# Patient Record
Sex: Female | Born: 1965
Health system: Southern US, Community
[De-identification: ages and names within clinical notes are randomized; demographics above are authoritative.]

## PROBLEM LIST (undated history)

## (undated) DIAGNOSIS — F17200 Nicotine dependence, unspecified, uncomplicated: Secondary | ICD-10-CM

## (undated) DIAGNOSIS — E063 Autoimmune thyroiditis: Secondary | ICD-10-CM

## (undated) DIAGNOSIS — E7800 Pure hypercholesterolemia, unspecified: Secondary | ICD-10-CM

## (undated) DIAGNOSIS — N907 Vulvar cyst: Secondary | ICD-10-CM

## (undated) DIAGNOSIS — N870 Mild cervical dysplasia: Secondary | ICD-10-CM

## (undated) DIAGNOSIS — I10 Essential (primary) hypertension: Secondary | ICD-10-CM

## (undated) DIAGNOSIS — E079 Disorder of thyroid, unspecified: Secondary | ICD-10-CM

## (undated) DIAGNOSIS — K7460 Unspecified cirrhosis of liver: Secondary | ICD-10-CM

## (undated) DIAGNOSIS — F32A Depression, unspecified: Secondary | ICD-10-CM

## (undated) DIAGNOSIS — K7581 Nonalcoholic steatohepatitis (NASH): Secondary | ICD-10-CM

## (undated) DIAGNOSIS — F419 Anxiety disorder, unspecified: Secondary | ICD-10-CM

## (undated) DIAGNOSIS — E039 Hypothyroidism, unspecified: Secondary | ICD-10-CM

## (undated) DIAGNOSIS — K219 Gastro-esophageal reflux disease without esophagitis: Secondary | ICD-10-CM

## (undated) DIAGNOSIS — E119 Type 2 diabetes mellitus without complications: Secondary | ICD-10-CM

## (undated) DIAGNOSIS — R768 Other specified abnormal immunological findings in serum: Secondary | ICD-10-CM

## (undated) DIAGNOSIS — F430 Acute stress reaction: Secondary | ICD-10-CM

## (undated) DIAGNOSIS — J309 Allergic rhinitis, unspecified: Secondary | ICD-10-CM

## (undated) DIAGNOSIS — E785 Hyperlipidemia, unspecified: Secondary | ICD-10-CM

## (undated) DIAGNOSIS — K760 Fatty (change of) liver, not elsewhere classified: Secondary | ICD-10-CM

## (undated) DIAGNOSIS — R569 Unspecified convulsions: Secondary | ICD-10-CM

## (undated) DIAGNOSIS — J449 Chronic obstructive pulmonary disease, unspecified: Secondary | ICD-10-CM

## (undated) HISTORY — DX: Acute stress reaction: F43.0

## (undated) HISTORY — DX: Depression, unspecified: F32.A

## (undated) HISTORY — DX: Hereditary hemochromatosis: E83.110

## (undated) HISTORY — DX: Fatty (change of) liver, not elsewhere classified: K76.0

## (undated) HISTORY — PX: HAND SURGERY: SHX662

## (undated) HISTORY — DX: Hyperlipidemia, unspecified: E78.5

## (undated) HISTORY — DX: Disorder of thyroid, unspecified: E07.9

## (undated) HISTORY — DX: Autoimmune thyroiditis: E06.3

## (undated) HISTORY — DX: Pure hypercholesterolemia, unspecified: E78.00

## (undated) HISTORY — DX: Vulvar cyst: N90.7

## (undated) HISTORY — PX: COLPOSCOPY: SHX161

## (undated) HISTORY — DX: Other specified abnormal immunological findings in serum: R76.8

## (undated) HISTORY — DX: Nicotine dependence, unspecified, uncomplicated: F17.200

## (undated) HISTORY — DX: Mild cervical dysplasia: N87.0

## (undated) HISTORY — PX: TUBAL LIGATION: SHX77

## (undated) HISTORY — PX: CERVICAL BIOPSY  W/ LOOP ELECTRODE EXCISION: SUR135

## (undated) HISTORY — DX: Allergic rhinitis, unspecified: J30.9

## (undated) HISTORY — DX: Nonalcoholic steatohepatitis (NASH): K75.81

## (undated) HISTORY — DX: Hemochromatosis, unspecified: E83.119

## (undated) HISTORY — DX: Unspecified cirrhosis of liver: K74.60

## (undated) HISTORY — DX: Essential (primary) hypertension: I10

## (undated) HISTORY — DX: Anxiety disorder, unspecified: F41.9

## (undated) HISTORY — DX: Chronic obstructive pulmonary disease, unspecified: J44.9

## (undated) HISTORY — DX: Morbid (severe) obesity due to excess calories: E66.01

## (undated) HISTORY — DX: Type 2 diabetes mellitus without complications: E11.9

---

## 1999-11-06 ENCOUNTER — Ambulatory Visit (HOSPITAL_COMMUNITY): Admission: RE | Admit: 1999-11-06 | Discharge: 1999-11-06 | Payer: Self-pay | Admitting: Family Medicine

## 1999-11-06 ENCOUNTER — Encounter: Payer: Self-pay | Admitting: Family Medicine

## 2001-08-06 ENCOUNTER — Encounter: Payer: Self-pay | Admitting: Emergency Medicine

## 2001-08-06 ENCOUNTER — Emergency Department (HOSPITAL_COMMUNITY): Admission: EM | Admit: 2001-08-06 | Discharge: 2001-08-06 | Payer: Self-pay | Admitting: Emergency Medicine

## 2002-05-12 ENCOUNTER — Other Ambulatory Visit: Admission: RE | Admit: 2002-05-12 | Discharge: 2002-05-12 | Payer: Self-pay | Admitting: Obstetrics and Gynecology

## 2002-07-05 ENCOUNTER — Ambulatory Visit (HOSPITAL_COMMUNITY): Admission: RE | Admit: 2002-07-05 | Discharge: 2002-07-05 | Payer: Self-pay | Admitting: Family Medicine

## 2002-07-05 ENCOUNTER — Encounter: Payer: Self-pay | Admitting: Family Medicine

## 2002-07-20 ENCOUNTER — Encounter: Payer: Self-pay | Admitting: Family Medicine

## 2002-07-20 ENCOUNTER — Ambulatory Visit (HOSPITAL_COMMUNITY): Admission: RE | Admit: 2002-07-20 | Discharge: 2002-07-20 | Payer: Self-pay | Admitting: Family Medicine

## 2003-06-17 ENCOUNTER — Other Ambulatory Visit: Admission: RE | Admit: 2003-06-17 | Discharge: 2003-06-17 | Payer: Self-pay | Admitting: Obstetrics and Gynecology

## 2005-04-23 ENCOUNTER — Other Ambulatory Visit: Admission: RE | Admit: 2005-04-23 | Discharge: 2005-04-23 | Payer: Self-pay | Admitting: Obstetrics and Gynecology

## 2005-06-17 ENCOUNTER — Other Ambulatory Visit: Admission: RE | Admit: 2005-06-17 | Discharge: 2005-06-17 | Payer: Self-pay | Admitting: Obstetrics and Gynecology

## 2006-02-06 ENCOUNTER — Other Ambulatory Visit: Admission: RE | Admit: 2006-02-06 | Discharge: 2006-02-06 | Payer: Self-pay | Admitting: Obstetrics and Gynecology

## 2006-03-21 ENCOUNTER — Ambulatory Visit (HOSPITAL_BASED_OUTPATIENT_CLINIC_OR_DEPARTMENT_OTHER): Admission: RE | Admit: 2006-03-21 | Discharge: 2006-03-21 | Payer: Self-pay | Admitting: Obstetrics and Gynecology

## 2010-11-26 ENCOUNTER — Other Ambulatory Visit: Payer: Self-pay | Admitting: Family Medicine

## 2010-11-26 DIAGNOSIS — Z1231 Encounter for screening mammogram for malignant neoplasm of breast: Secondary | ICD-10-CM

## 2010-12-03 ENCOUNTER — Ambulatory Visit
Admission: RE | Admit: 2010-12-03 | Discharge: 2010-12-03 | Disposition: A | Discharge status: Home / Self Care | Payer: BC Managed Care – PPO | Source: Ambulatory Visit | Attending: Family Medicine | Admitting: Family Medicine

## 2010-12-03 DIAGNOSIS — Z1231 Encounter for screening mammogram for malignant neoplasm of breast: Secondary | ICD-10-CM

## 2010-12-05 ENCOUNTER — Other Ambulatory Visit: Payer: Self-pay | Admitting: Family Medicine

## 2010-12-05 DIAGNOSIS — R928 Other abnormal and inconclusive findings on diagnostic imaging of breast: Secondary | ICD-10-CM

## 2010-12-26 ENCOUNTER — Ambulatory Visit
Admission: RE | Admit: 2010-12-26 | Discharge: 2010-12-26 | Disposition: A | Discharge status: Home / Self Care | Payer: BC Managed Care – PPO | Source: Ambulatory Visit | Attending: Family Medicine | Admitting: Family Medicine

## 2010-12-26 DIAGNOSIS — R928 Other abnormal and inconclusive findings on diagnostic imaging of breast: Secondary | ICD-10-CM

## 2011-02-19 ENCOUNTER — Encounter: Payer: Self-pay | Admitting: Gynecology

## 2011-02-19 DIAGNOSIS — N870 Mild cervical dysplasia: Secondary | ICD-10-CM | POA: Insufficient documentation

## 2011-02-19 DIAGNOSIS — E039 Hypothyroidism, unspecified: Secondary | ICD-10-CM | POA: Insufficient documentation

## 2011-02-19 DIAGNOSIS — N907 Vulvar cyst: Secondary | ICD-10-CM | POA: Insufficient documentation

## 2011-02-26 ENCOUNTER — Ambulatory Visit (INDEPENDENT_AMBULATORY_CARE_PROVIDER_SITE_OTHER): Payer: BC Managed Care – PPO | Admitting: Obstetrics and Gynecology

## 2011-02-26 ENCOUNTER — Other Ambulatory Visit (HOSPITAL_COMMUNITY)
Admission: RE | Admit: 2011-02-26 | Discharge: 2011-02-26 | Disposition: A | Discharge status: Home / Self Care | Payer: BC Managed Care – PPO | Source: Ambulatory Visit | Attending: Obstetrics and Gynecology | Admitting: Obstetrics and Gynecology

## 2011-02-26 ENCOUNTER — Encounter: Payer: Self-pay | Admitting: Obstetrics and Gynecology

## 2011-02-26 VITALS — BP 120/80 | Ht 61.0 in | Wt 135.0 lb

## 2011-02-26 DIAGNOSIS — I10 Essential (primary) hypertension: Secondary | ICD-10-CM | POA: Insufficient documentation

## 2011-02-26 DIAGNOSIS — Z01419 Encounter for gynecological examination (general) (routine) without abnormal findings: Secondary | ICD-10-CM | POA: Insufficient documentation

## 2011-02-26 DIAGNOSIS — N390 Urinary tract infection, site not specified: Secondary | ICD-10-CM

## 2011-02-26 LAB — URINALYSIS W MICROSCOPIC + REFLEX CULTURE
Casts: NONE SEEN
Crystals: NONE SEEN
Glucose, UA: NEGATIVE mg/dL
Ketones, ur: NEGATIVE mg/dL
Leukocytes, UA: NEGATIVE
Nitrite: NEGATIVE
Specific Gravity, Urine: 1.015 (ref 1.005–1.030)
WBC, UA: NONE SEEN WBC/hpf (ref <3)
pH: 5.5 (ref 5.0–8.0)

## 2011-02-26 NOTE — Progress Notes (Signed)
Patient is a 46 year old gravida 1 para 1 AB 0 who came to see me today for a new patient visit for annual gynecological exam. She used to be a patient of our office. We have not seen her since 2008. We have treated her at that point for HPV with a LEEP. She has not had any followup Paps. Her periods are fine. She just had  a mammogram. She required followup because of a possible mass. The final diagnosis was just fibrocystic disease. She is due for short term followup. She does her lab work from her PCP. She had hyperthyroidism and had radioactive iodine. She is now on thyroid replacement. She contraceptives by tubal ligation.  Physical examination:  Caryn Bee present. HEENT within normal limits. Neck: Thyroid not large. No masses. Supraclavicular nodes: not enlarged. Breasts: Examined in both sitting midline position. No skin changes and no masses. Abdomen: Soft no guarding rebound or masses or hernia. Pelvic: External: Within normal limits. BUS: Within normal limits. Vaginal:within normal limits. Good estrogen effect. No evidence of cystocele rectocele or enterocele. Cervix: clean. Uterus: Normal size and shape. Adnexa: No masses. Rectovaginal exam: Confirmatory and negative. Extremities: Within normal limits.  Urinalysis today showed microscopic hematuria. She is one week from the end of her last period. She's never been told this previously.  Assessment: #1. CIN status post leap #2. Fibrocystic breast disease #3. Microscopic hematuria.  Plan: Told her she should return in 6 months for Pap. Short-term mammogram followup. Urine culture done. She will call for the results. If negative I told her we would recheck it one more time and is still present get urological consult.

## 2011-02-28 ENCOUNTER — Other Ambulatory Visit: Payer: Self-pay | Admitting: *Deleted

## 2011-02-28 DIAGNOSIS — N390 Urinary tract infection, site not specified: Secondary | ICD-10-CM

## 2011-04-04 ENCOUNTER — Other Ambulatory Visit: Payer: BC Managed Care – PPO

## 2011-04-04 DIAGNOSIS — N390 Urinary tract infection, site not specified: Secondary | ICD-10-CM

## 2011-04-05 LAB — URINALYSIS W MICROSCOPIC + REFLEX CULTURE
Bilirubin Urine: NEGATIVE
Crystals: NONE SEEN
Glucose, UA: NEGATIVE mg/dL
Specific Gravity, Urine: 1.011 (ref 1.005–1.030)
Squamous Epithelial / LPF: NONE SEEN

## 2011-04-08 ENCOUNTER — Other Ambulatory Visit: Payer: Self-pay | Admitting: *Deleted

## 2011-04-08 DIAGNOSIS — R3129 Other microscopic hematuria: Secondary | ICD-10-CM

## 2011-07-24 ENCOUNTER — Other Ambulatory Visit: Payer: Self-pay | Admitting: Family Medicine

## 2011-07-24 DIAGNOSIS — N6489 Other specified disorders of breast: Secondary | ICD-10-CM

## 2011-12-09 ENCOUNTER — Ambulatory Visit
Admission: RE | Admit: 2011-12-09 | Discharge: 2011-12-09 | Disposition: A | Discharge status: Home / Self Care | Payer: BC Managed Care – PPO | Source: Ambulatory Visit | Attending: Family Medicine | Admitting: Family Medicine

## 2011-12-09 ENCOUNTER — Other Ambulatory Visit: Payer: Self-pay | Admitting: Family Medicine

## 2011-12-09 DIAGNOSIS — N6489 Other specified disorders of breast: Secondary | ICD-10-CM

## 2012-06-01 ENCOUNTER — Other Ambulatory Visit: Payer: Self-pay | Admitting: Family Medicine

## 2012-06-01 DIAGNOSIS — R109 Unspecified abdominal pain: Secondary | ICD-10-CM

## 2012-06-02 ENCOUNTER — Ambulatory Visit
Admission: RE | Admit: 2012-06-02 | Discharge: 2012-06-02 | Disposition: A | Discharge status: Home / Self Care | Payer: BC Managed Care – PPO | Source: Ambulatory Visit | Attending: Family Medicine | Admitting: Family Medicine

## 2012-06-02 DIAGNOSIS — R109 Unspecified abdominal pain: Secondary | ICD-10-CM

## 2012-09-30 ENCOUNTER — Ambulatory Visit
Admission: RE | Admit: 2012-09-30 | Discharge: 2012-09-30 | Disposition: A | Discharge status: Home / Self Care | Payer: BC Managed Care – PPO | Source: Ambulatory Visit | Attending: Family Medicine | Admitting: Family Medicine

## 2012-09-30 ENCOUNTER — Other Ambulatory Visit: Payer: Self-pay | Admitting: Family Medicine

## 2012-09-30 DIAGNOSIS — R05 Cough: Secondary | ICD-10-CM

## 2012-09-30 DIAGNOSIS — R059 Cough, unspecified: Secondary | ICD-10-CM

## 2013-12-13 ENCOUNTER — Encounter: Payer: Self-pay | Admitting: Obstetrics and Gynecology

## 2014-12-26 ENCOUNTER — Other Ambulatory Visit: Payer: Self-pay | Admitting: Family Medicine

## 2014-12-26 ENCOUNTER — Other Ambulatory Visit (HOSPITAL_COMMUNITY)
Admission: RE | Admit: 2014-12-26 | Discharge: 2014-12-26 | Disposition: A | Discharge status: Home / Self Care | Payer: BLUE CROSS/BLUE SHIELD | Source: Ambulatory Visit | Attending: Family Medicine | Admitting: Family Medicine

## 2014-12-26 DIAGNOSIS — Z1151 Encounter for screening for human papillomavirus (HPV): Secondary | ICD-10-CM | POA: Insufficient documentation

## 2014-12-26 DIAGNOSIS — Z01419 Encounter for gynecological examination (general) (routine) without abnormal findings: Secondary | ICD-10-CM | POA: Insufficient documentation

## 2014-12-29 LAB — CYTOLOGY - PAP

## 2015-04-28 ENCOUNTER — Emergency Department (HOSPITAL_COMMUNITY): Payer: BLUE CROSS/BLUE SHIELD

## 2015-04-28 ENCOUNTER — Inpatient Hospital Stay (HOSPITAL_COMMUNITY)
Admission: EM | Admit: 2015-04-28 | Discharge: 2015-04-29 | DRG: 101 | Disposition: A | Discharge status: Home / Self Care | Payer: BLUE CROSS/BLUE SHIELD | Attending: Internal Medicine | Admitting: Internal Medicine

## 2015-04-28 ENCOUNTER — Encounter (HOSPITAL_COMMUNITY): Payer: Self-pay | Admitting: Emergency Medicine

## 2015-04-28 ENCOUNTER — Inpatient Hospital Stay (HOSPITAL_COMMUNITY): Payer: BLUE CROSS/BLUE SHIELD

## 2015-04-28 DIAGNOSIS — E038 Other specified hypothyroidism: Secondary | ICD-10-CM

## 2015-04-28 DIAGNOSIS — G4089 Other seizures: Principal | ICD-10-CM | POA: Diagnosis present

## 2015-04-28 DIAGNOSIS — Y929 Unspecified place or not applicable: Secondary | ICD-10-CM

## 2015-04-28 DIAGNOSIS — S43015D Anterior dislocation of left humerus, subsequent encounter: Secondary | ICD-10-CM

## 2015-04-28 DIAGNOSIS — I10 Essential (primary) hypertension: Secondary | ICD-10-CM | POA: Diagnosis present

## 2015-04-28 DIAGNOSIS — F329 Major depressive disorder, single episode, unspecified: Secondary | ICD-10-CM | POA: Diagnosis present

## 2015-04-28 DIAGNOSIS — W1830XA Fall on same level, unspecified, initial encounter: Secondary | ICD-10-CM | POA: Diagnosis present

## 2015-04-28 DIAGNOSIS — E039 Hypothyroidism, unspecified: Secondary | ICD-10-CM | POA: Diagnosis present

## 2015-04-28 DIAGNOSIS — R569 Unspecified convulsions: Secondary | ICD-10-CM | POA: Diagnosis not present

## 2015-04-28 DIAGNOSIS — S43005A Unspecified dislocation of left shoulder joint, initial encounter: Secondary | ICD-10-CM

## 2015-04-28 DIAGNOSIS — S43016A Anterior dislocation of unspecified humerus, initial encounter: Secondary | ICD-10-CM | POA: Diagnosis present

## 2015-04-28 DIAGNOSIS — Z87891 Personal history of nicotine dependence: Secondary | ICD-10-CM | POA: Diagnosis not present

## 2015-04-28 LAB — CBC WITH DIFFERENTIAL/PLATELET
BASOS ABS: 0 10*3/uL (ref 0.0–0.1)
BASOS PCT: 0 %
EOS PCT: 1 %
Eosinophils Absolute: 0.1 10*3/uL (ref 0.0–0.7)
HEMATOCRIT: 40.7 % (ref 36.0–46.0)
Hemoglobin: 14.4 g/dL (ref 12.0–15.0)
Lymphocytes Relative: 12 %
Lymphs Abs: 1.4 10*3/uL (ref 0.7–4.0)
MCH: 32.4 pg (ref 26.0–34.0)
MCHC: 35.4 g/dL (ref 30.0–36.0)
MCV: 91.7 fL (ref 78.0–100.0)
MONO ABS: 0.5 10*3/uL (ref 0.1–1.0)
Monocytes Relative: 4 %
NEUTROS ABS: 10.2 10*3/uL — AB (ref 1.7–7.7)
Neutrophils Relative %: 83 %
PLATELETS: 235 10*3/uL (ref 150–400)
RBC: 4.44 MIL/uL (ref 3.87–5.11)
RDW: 14 % (ref 11.5–15.5)
WBC: 12.3 10*3/uL — AB (ref 4.0–10.5)

## 2015-04-28 LAB — RAPID URINE DRUG SCREEN, HOSP PERFORMED
Amphetamines: NOT DETECTED
BARBITURATES: NOT DETECTED
BENZODIAZEPINES: NOT DETECTED
COCAINE: NOT DETECTED
Opiates: POSITIVE — AB
TETRAHYDROCANNABINOL: NOT DETECTED

## 2015-04-28 LAB — COMPREHENSIVE METABOLIC PANEL
ALBUMIN: 3.9 g/dL (ref 3.5–5.0)
ALT: 21 U/L (ref 14–54)
AST: 23 U/L (ref 15–41)
Alkaline Phosphatase: 68 U/L (ref 38–126)
Anion gap: 12 (ref 5–15)
BUN: 15 mg/dL (ref 6–20)
CHLORIDE: 107 mmol/L (ref 101–111)
CO2: 23 mmol/L (ref 22–32)
Calcium: 9.3 mg/dL (ref 8.9–10.3)
Creatinine, Ser: 1.23 mg/dL — ABNORMAL HIGH (ref 0.44–1.00)
GFR calc Af Amer: 58 mL/min — ABNORMAL LOW (ref >60)
GFR calc non Af Amer: 50 mL/min — ABNORMAL LOW (ref >60)
GLUCOSE: 86 mg/dL (ref 65–99)
POTASSIUM: 4.6 mmol/L (ref 3.5–5.1)
Sodium: 142 mmol/L (ref 135–145)
Total Bilirubin: 0.3 mg/dL (ref 0.3–1.2)
Total Protein: 6.9 g/dL (ref 6.5–8.1)

## 2015-04-28 LAB — URINALYSIS, ROUTINE W REFLEX MICROSCOPIC
Bilirubin Urine: NEGATIVE
GLUCOSE, UA: NEGATIVE mg/dL
HGB URINE DIPSTICK: NEGATIVE
Ketones, ur: NEGATIVE mg/dL
Leukocytes, UA: NEGATIVE
Nitrite: NEGATIVE
Protein, ur: NEGATIVE mg/dL
SPECIFIC GRAVITY, URINE: 1.019 (ref 1.005–1.030)
pH: 6.5 (ref 5.0–8.0)

## 2015-04-28 LAB — CK: Total CK: 197 U/L (ref 38–234)

## 2015-04-28 LAB — ETHANOL

## 2015-04-28 LAB — TSH: TSH: 2.338 u[IU]/mL (ref 0.350–4.500)

## 2015-04-28 LAB — PROTIME-INR
INR: 1 (ref 0.00–1.49)
PROTHROMBIN TIME: 13.4 s (ref 11.6–15.2)

## 2015-04-28 MED ORDER — ONDANSETRON HCL 4 MG/2ML IJ SOLN: 4.0000 mg | Freq: Four times a day (QID) | INTRAMUSCULAR | Status: DC | PRN

## 2015-04-28 MED ORDER — DIVALPROEX SODIUM 500 MG PO DR TAB
500.0000 mg | DELAYED_RELEASE_TABLET | Freq: Two times a day (BID) | ORAL | Status: DC
  Administered 2015-04-29: 500 mg via ORAL
  Filled 2015-04-28: qty 1

## 2015-04-28 MED ORDER — LORAZEPAM 2 MG/ML IJ SOLN
0.5000 mg | Freq: Once | INTRAMUSCULAR | Status: AC
  Administered 2015-04-28: 0.5 mg via INTRAVENOUS
  Filled 2015-04-28: qty 1

## 2015-04-28 MED ORDER — LEVOTHYROXINE SODIUM 175 MCG PO TABS
175.0000 ug | ORAL_TABLET | Freq: Every day | ORAL | Status: DC
  Administered 2015-04-29: 175 ug via ORAL
  Filled 2015-04-28: qty 1

## 2015-04-28 MED ORDER — PROPOFOL 10 MG/ML IV BOLUS
1.0000 mg/kg | Freq: Once | INTRAVENOUS | Status: DC
  Filled 2015-04-28: qty 20

## 2015-04-28 MED ORDER — MORPHINE SULFATE (PF) 4 MG/ML IV SOLN
4.0000 mg | Freq: Once | INTRAVENOUS | Status: AC
  Administered 2015-04-28: 4 mg via INTRAVENOUS
  Filled 2015-04-28: qty 1

## 2015-04-28 MED ORDER — BUPROPION HCL ER (XL) 300 MG PO TB24: 900.0000 mg | ORAL_TABLET | Freq: Every day | ORAL | Status: DC

## 2015-04-28 MED ORDER — DEXTROSE 5 % IV SOLN
1000.0000 mg | Freq: Once | INTRAVENOUS | Status: AC
Start: 2015-04-28 — End: 2015-04-28
  Administered 2015-04-28: 1000 mg via INTRAVENOUS
  Filled 2015-04-28: qty 10

## 2015-04-28 MED ORDER — HEPARIN SODIUM (PORCINE) 5000 UNIT/ML IJ SOLN
5000.0000 [IU] | Freq: Three times a day (TID) | INTRAMUSCULAR | Status: DC
  Administered 2015-04-28 – 2015-04-29 (×2): 5000 [IU] via SUBCUTANEOUS
  Filled 2015-04-28 (×2): qty 1

## 2015-04-28 MED ORDER — PROPOFOL 10 MG/ML IV BOLUS
INTRAVENOUS | Status: AC
Start: 2015-04-28 — End: 2015-04-29
  Filled 2015-04-28: qty 20

## 2015-04-28 MED ORDER — ONDANSETRON HCL 4 MG PO TABS: 4.0000 mg | ORAL_TABLET | Freq: Four times a day (QID) | ORAL | Status: DC | PRN

## 2015-04-28 MED ORDER — PANTOPRAZOLE SODIUM 40 MG PO TBEC
40.0000 mg | DELAYED_RELEASE_TABLET | Freq: Every day | ORAL | Status: DC
  Administered 2015-04-28 – 2015-04-29 (×2): 40 mg via ORAL
  Filled 2015-04-28 (×2): qty 1

## 2015-04-28 MED ORDER — HYDROCODONE-ACETAMINOPHEN 5-325 MG PO TABS
1.0000 | ORAL_TABLET | ORAL | Status: DC | PRN
  Administered 2015-04-29 (×3): 2 via ORAL
  Filled 2015-04-28 (×3): qty 2

## 2015-04-28 MED ORDER — GADOBENATE DIMEGLUMINE 529 MG/ML IV SOLN
13.0000 mL | Freq: Once | INTRAVENOUS | Status: AC | PRN
  Administered 2015-04-28: 13 mL via INTRAVENOUS

## 2015-04-28 MED ORDER — MORPHINE SULFATE (PF) 2 MG/ML IV SOLN
1.0000 mg | INTRAVENOUS | Status: DC | PRN
  Administered 2015-04-29: 1 mg via INTRAVENOUS
  Filled 2015-04-28: qty 1

## 2015-04-28 MED ORDER — LOSARTAN POTASSIUM 50 MG PO TABS
50.0000 mg | ORAL_TABLET | Freq: Every day | ORAL | Status: DC
  Administered 2015-04-29: 50 mg via ORAL
  Filled 2015-04-28: qty 1

## 2015-04-28 MED ORDER — PROPOFOL 10 MG/ML IV BOLUS
INTRAVENOUS | Status: DC | PRN
  Administered 2015-04-28: 60 mg via INTRAVENOUS

## 2015-04-28 MED ORDER — LORAZEPAM 2 MG/ML IJ SOLN: 2.0000 mg | INTRAMUSCULAR | Status: DC | PRN

## 2015-04-28 MED ORDER — SODIUM CHLORIDE 0.9 % IV SOLN
INTRAVENOUS | Status: DC
  Administered 2015-04-28: 23:00:00 via INTRAVENOUS

## 2015-04-28 MED ORDER — ACETAMINOPHEN 325 MG PO TABS: 650.0000 mg | ORAL_TABLET | Freq: Four times a day (QID) | ORAL | Status: DC | PRN

## 2015-04-28 MED ORDER — SODIUM CHLORIDE 0.9% FLUSH
3.0000 mL | Freq: Two times a day (BID) | INTRAVENOUS | Status: DC
Start: 2015-04-28 — End: 2015-04-29
  Administered 2015-04-28: 3 mL via INTRAVENOUS

## 2015-04-28 MED ORDER — PROPOFOL 10 MG/ML IV BOLUS
INTRAVENOUS | Status: AC | PRN
  Administered 2015-04-28: 61 mg via INTRAVENOUS

## 2015-04-28 MED ORDER — ACETAMINOPHEN 650 MG RE SUPP: 650.0000 mg | Freq: Four times a day (QID) | RECTAL | Status: DC | PRN

## 2015-04-28 NOTE — ED Notes (Signed)
Pt here after having a first time seizure while working , pt did fall on left shoulder and is c/o pain in that shoulder

## 2015-04-28 NOTE — Progress Notes (Signed)
Orthopedic Tech Progress Note Patient Details:  Emma Glenn 1965/12/11 [id number] Assisted with dislocation reduction then applied shoulder immobilizer sling to LUE. Ortho Devices Type of Ortho Device: Shoulder immobilizer Ortho Device/Splint Location: LUE Ortho Device/Splint Interventions: Application   Darrol Poke 04/28/2015, 2:47 PM

## 2015-04-28 NOTE — ED Notes (Signed)
Attempted report 

## 2015-04-28 NOTE — Consult Note (Signed)
Requesting Physician: Josephina Gip, PA     Reason for consultation: seizure   HPI:                                                                                                                                         Emma Glenn is an 50 y.o. female patient who presented with a seizure. She had a left shoulder dislocation secondary to seizure. There is a first ever seizure lifetime. Denies any febrile seizures no prior head trauma or any intracranial pathology. She is at her baseline on no focal symptoms other than severe pain from left shoulder dislocation.   Past Medical History: Past Medical History  Diagnosis Date  . CIN I (cervical intraepithelial neoplasia I)   . Labial cyst     Inclusion cyst  . Hypertension   . Thyroid disease     Graves dis-Radioactive Iodine    Past Surgical History  Procedure Laterality Date  . Tubal ligation    . Cervical biopsy  w/ loop electrode excision      Excision labial inclusion cyst  . Hand surgery    . Colposcopy      Family History: Family History  Problem Relation Age of Onset  . Hypertension Mother   . Heart disease Mother   . Diabetes Father   . Heart disease Father   . Heart disease Brother   . Breast cancer Maternal Grandmother     Social History:   reports that she has quit smoking. Her smoking use included Cigarettes. She smoked 1.00 pack per day. She does not have any smokeless tobacco history on file. She reports that she drinks alcohol. Her drug history is not on file.  Allergies:  Allergies  Allergen Reactions  . Nitrofurantoin Monohyd Macro      Medications:                                                                                                                         Current facility-administered medications:  .  propofol (DIPRIVAN) 10 mg/mL bolus/IV push 61.2 mg, 1 mg/kg, Intravenous, Once, Courteney Lyn Mackuen, MD .  propofol (DIPRIVAN) 10 mg/mL bolus/IV push, , , ,  .  propofol (DIPRIVAN)  10 mg/mL bolus/IV push, , Intravenous, Continuous PRN, Courteney Lyn Mackuen, MD, 61 mg at 04/28/15 1440  Current outpatient prescriptions:  .  buPROPion (WELLBUTRIN XL)  300 MG 24 hr tablet, Take 900 mg by mouth daily., Disp: , Rfl:  .  levothyroxine (SYNTHROID, LEVOTHROID) 175 MCG tablet, Take 175 mcg by mouth daily before breakfast., Disp: , Rfl:  .  losartan (COZAAR) 50 MG tablet, Take 50 mg by mouth daily., Disp: , Rfl:  .  omeprazole (PRILOSEC) 40 MG capsule, Take 40 mg by mouth daily., Disp: , Rfl:  .  phentermine 37.5 MG capsule, Take 37.5 mg by mouth every morning., Disp: , Rfl:  .  buPROPion (WELLBUTRIN XL) 150 MG 24 hr tablet, Take 150 mg by mouth daily.  , Disp: , Rfl:  .  LEVOTHYROXINE SODIUM PO, Take 0.15 mcg by mouth., Disp: , Rfl:    ROS:                                                                                                                                       History obtained from the patient  General ROS: negative for - chills, fatigue, fever, night sweats, weight gain or weight loss Psychological ROS: negative for - behavioral disorder, hallucinations, memory difficulties, mood swings or suicidal ideation Ophthalmic ROS: negative for - blurry vision, double vision, eye pain or loss of vision ENT ROS: negative for - epistaxis, nasal discharge, oral lesions, sore throat, tinnitus or vertigo Allergy and Immunology ROS: negative for - hives or itchy/watery eyes Hematological and Lymphatic ROS: negative for - bleeding problems, bruising or swollen lymph nodes Endocrine ROS: negative for - galactorrhea, hair pattern changes, polydipsia/polyuria or temperature intolerance Respiratory ROS: negative for - cough, hemoptysis, shortness of breath or wheezing Cardiovascular ROS: negative for - chest pain, dyspnea on exertion, edema or irregular heartbeat Gastrointestinal ROS: negative for - abdominal pain, diarrhea, hematemesis, nausea/vomiting or stool  incontinence Genito-Urinary ROS: negative for - dysuria, hematuria, incontinence or urinary frequency/urgency Musculoskeletal ROS: negative for - joint swelling or muscular weakness Neurological ROS: as noted in HPI Dermatological ROS: negative for rash and skin lesion changes  Neurologic Examination:                                                                                                    Today's Vitals   04/28/15 1223 04/28/15 1227 04/28/15 1444 04/28/15 1447  BP:  155/109 164/120 148/118  Pulse:  110 100 100  Temp: 98.5 F (36.9 C) 98.5 F (36.9 C)    TempSrc: Oral Oral    Resp: 18 16 21 22   Height: 5' 1"  (1.549 m)     Weight:  61.236 kg (135 lb)     SpO2:  96% 99% 98%  PainSc:        Evaluation of higher integrative functions including: Level of alertness: Alert,  Oriented to time, place and person Recent and remote memory - intact   Attention span and concentration  - intact   Speech: fluent, no evidence of dysarthria or aphasia noted.  Test the following cranial nerves: 2-12 grossly intact Motor examination: Normal tone, bulk, full 5/5 motor strength in all extremities, except the left upper extremity, as she would not cooperate with exam due to severe pain from dislocation.  Examination of sensation : Normal and symmetric sensation to pinprick in all 4 extremities and on face Examination of deep tendon reflexes: 2+, normal and symmetric in all extremities, normal plantars bilaterally Test coordination: Normal finger nose testing, with no evidence of limb appendicular ataxia or abnormal involuntary movements or tremors noted.  Gait: Deferred       Lab Results: Basic Metabolic Panel:  Recent Labs Lab 04/28/15 1315  NA 142  K 4.6  CL 107  CO2 23  GLUCOSE 86  BUN 15  CREATININE 1.23*  CALCIUM 9.3    Liver Function Tests:  Recent Labs Lab 04/28/15 1315  AST 23  ALT 21  ALKPHOS 68  BILITOT 0.3  PROT 6.9  ALBUMIN 3.9   No results for  input(s): LIPASE, AMYLASE in the last 168 hours. No results for input(s): AMMONIA in the last 168 hours.  CBC:  Recent Labs Lab 04/28/15 1315  WBC 12.3*  NEUTROABS 10.2*  HGB 14.4  HCT 40.7  MCV 91.7  PLT 235    Cardiac Enzymes: No results for input(s): CKTOTAL, CKMB, CKMBINDEX, TROPONINI in the last 168 hours.  Lipid Panel: No results for input(s): CHOL, TRIG, HDL, CHOLHDL, VLDL, LDLCALC in the last 168 hours.  CBG: No results for input(s): GLUCAP in the last 168 hours.  Microbiology: Results for orders placed or performed in visit on 04/04/11  Urine culture     Status: None   Collection Time: 04/04/11  3:53 PM  Result Value Ref Range Status   Colony Count NO GROWTH  Final   Organism ID, Bacteria NO GROWTH  Final     Imaging: Ct Head Wo Contrast  04/28/2015  CLINICAL DATA:  50 year old female status post fall out of a truck following seizure-like activity EXAM: CT HEAD WITHOUT CONTRAST TECHNIQUE: Contiguous axial images were obtained from the base of the skull through the vertex without intravenous contrast. COMPARISON:  None. FINDINGS: Negative for acute intracranial hemorrhage, acute infarction, mass, mass effect, hydrocephalus or midline shift. Gray-white differentiation is preserved throughout. No acute soft tissue or calvarial abnormality. The globes and orbits are symmetric and unremarkable. Normal aeration of the mastoid air cells and visualized paranasal sinuses. IMPRESSION: Negative head CT. Electronically Signed   By: Jacqulynn Cadet M.D.   On: 04/28/2015 13:55   Dg Shoulder Left  04/28/2015  CLINICAL DATA:  Acute left shoulder pain after falling out of truck. Initial encounter. EXAM: LEFT SHOULDER - 2+ VIEW COMPARISON:  None. FINDINGS: Anterior dislocation of the proximal left humeral head is noted. No fracture is noted. Visualized ribs appear normal. IMPRESSION: Anterior dislocation of the proximal left humeral head. Electronically Signed   By: Marijo Conception,  M.D.   On: 04/28/2015 14:10      Assessment and plan:   Emma Glenn is an 50 y.o. female patient who presented Following a seizure. There is a  first ever seizure in a lifetime. She had a left shoulder dislocation secondary to this. The etiology for the seizure is unknown. Patient believes that it could be related to and her Wellbutrin. She picked up a new prescription with the different recommend fracture. She is on high dose of 900 mg of Wellbutrin XL daily. Recommend further neurodiagnostic evaluation with a EEG and brain MRI study she'll be admitted for overnight observation placed on seizure precautions with when necessary Ativan. She did receive propofol small doses of left eye to help with the shoulder relocation. Given her significant seizure observers shoulder discomfort location, I recommend starting on seizure medication.-She does have chronic depression issues and hence a most of resistance Depakote would be an ideal medication for her. Recommend a loading dose of Depacon IV 1 g now in the ER followed by maintenance dose of 500 mg of Depakote DR twice a day started tomorrow morning neurology service will continue to follow up please call for any further questions

## 2015-04-28 NOTE — H&P (Signed)
[facility] History and Physical  Emma Glenn RCV:[id number] DOB: Mar 05, 1965 DOA: 04/28/2015  Referring physician: EDP PCP: Shirline Frees, MD   Chief Complaint: Seizure and left anterior shoulder dislocation  HPI: Emma Glenn is a 50 y.o. female with past medical history of hypertension and Graves' disease status post radioactive iodine, she came into the hospital because of seizures. Patient was helping her husband with loading something to his pickup truck and she developed some confusion and "uncontrolled thoughts" after that she developed tonic-clonic seizures and fell and her head struck the back of the truck, she stayed confused for about 30 minutes after the episode. Question brought to the hospital for further evaluation. In the ED x-rays showed left anterior shoulder dislocation which is reduced after patient given propofol, CT scan of the head showed no acute abnormalities, patient seen by neurology and recommended admission to the hospital for further neurological evaluation.  Review of Systems:  Constitutional: negative for anorexia, fevers and sweats Eyes: negative for irritation, redness and visual disturbance Ears, nose, mouth, throat, and face: negative for earaches, epistaxis, nasal congestion and sore throat Respiratory: negative for cough, dyspnea on exertion, sputum and wheezing Cardiovascular: negative for chest pain, dyspnea, lower extremity edema, orthopnea, palpitations and syncope Gastrointestinal: negative for abdominal pain, constipation, diarrhea, melena, nausea and vomiting Genitourinary:negative for dysuria, frequency and hematuria Hematologic/lymphatic: negative for bleeding, easy bruising and lymphadenopathy Musculoskeletal:negative for arthralgias, muscle weakness and stiff joints Neurological: Tonic-clonic seizures Endocrine: negative for diabetic symptoms including polydipsia, polyuria and weight loss Allergic/Immunologic: negative  for anaphylaxis, hay fever and urticaria  Past Medical History  Diagnosis Date  . CIN I (cervical intraepithelial neoplasia I)   . Labial cyst     Inclusion cyst  . Hypertension   . Thyroid disease     Graves dis-Radioactive Iodine   Past Surgical History  Procedure Laterality Date  . Tubal ligation    . Cervical biopsy  w/ loop electrode excision      Excision labial inclusion cyst  . Hand surgery    . Colposcopy     Social History:   reports that she has quit smoking. Her smoking use included Cigarettes. She smoked 1.00 pack per day. She does not have any smokeless tobacco history on file. She reports that she drinks alcohol. Her drug history is not on file.  Allergies  Allergen Reactions  . Nitrofurantoin Monohyd Macro     Family History  Problem Relation Age of Onset  . Hypertension Mother   . Heart disease Mother   . Diabetes Father   . Heart disease Father   . Heart disease Brother   . Breast cancer Maternal Grandmother      Prior to Admission medications   Medication Sig Start Date End Date Taking? Authorizing Provider  buPROPion (WELLBUTRIN XL) 300 MG 24 hr tablet Take 900 mg by mouth daily.   Yes Historical Provider, MD  levothyroxine (SYNTHROID, LEVOTHROID) 175 MCG tablet Take 175 mcg by mouth daily before breakfast.   Yes Historical Provider, MD  losartan (COZAAR) 50 MG tablet Take 50 mg by mouth daily.   Yes Historical Provider, MD  omeprazole (PRILOSEC) 40 MG capsule Take 40 mg by mouth daily.   Yes Historical Provider, MD  phentermine 37.5 MG capsule Take 37.5 mg by mouth every morning.   Yes Historical Provider, MD  buPROPion (WELLBUTRIN XL) 150 MG 24 hr tablet Take 150 mg by mouth daily.      Historical Provider, MD  LEVOTHYROXINE SODIUM  PO Take 0.15 mcg by mouth.    Historical Provider, MD   Physical Exam: Filed Vitals:   04/28/15 1645 04/28/15 1715  BP: 156/103 157/104  Pulse: 98 99  Temp:    Resp: 10 15   Constitutional: Oriented to person,  place, and time. Well-developed and well-nourished. Cooperative.  Head: Normocephalic and atraumatic.  Nose: Nose normal.  Mouth/Throat: Uvula is midline, oropharynx is clear and moist and mucous membranes are normal.  Eyes: Conjunctivae and EOM are normal. Pupils are equal, round, and reactive to light.  Neck: Trachea normal and normal range of motion. Neck supple.  Cardiovascular: Normal rate, regular rhythm, S1 normal, S2 normal, normal heart sounds and intact distal pulses.   Pulmonary/Chest: Effort normal and breath sounds normal.  Abdominal: Soft. Bowel sounds are normal. There is no hepatosplenomegaly. There is no tenderness.  Musculoskeletal: Normal range of motion.  Neurological: Alert and oriented to person, place, and time. Has normal strength. No cranial nerve deficit or sensory deficit.  Skin: Skin is warm, dry and intact.  Psychiatric: Has a normal mood and affect. Speech is normal and behavior is normal.   Labs on Admission:  Basic Metabolic Panel:  Recent Labs Lab 04/28/15 1315  NA 142  K 4.6  CL 107  CO2 23  GLUCOSE 86  BUN 15  CREATININE 1.23*  CALCIUM 9.3   Liver Function Tests:  Recent Labs Lab 04/28/15 1315  AST 23  ALT 21  ALKPHOS 68  BILITOT 0.3  PROT 6.9  ALBUMIN 3.9   No results for input(s): LIPASE, AMYLASE in the last 168 hours. No results for input(s): AMMONIA in the last 168 hours. CBC:  Recent Labs Lab 04/28/15 1315  WBC 12.3*  NEUTROABS 10.2*  HGB 14.4  HCT 40.7  MCV 91.7  PLT 235   Cardiac Enzymes: No results for input(s): CKTOTAL, CKMB, CKMBINDEX, TROPONINI in the last 168 hours.  BNP (last 3 results) No results for input(s): BNP in the last 8760 hours.  ProBNP (last 3 results) No results for input(s): PROBNP in the last 8760 hours.  CBG: No results for input(s): GLUCAP in the last 168 hours.  Radiological Exams on Admission: Ct Head Wo Contrast  04/28/2015  CLINICAL DATA:  49 year old female status post fall out of  a truck following seizure-like activity EXAM: CT HEAD WITHOUT CONTRAST TECHNIQUE: Contiguous axial images were obtained from the base of the skull through the vertex without intravenous contrast. COMPARISON:  None. FINDINGS: Negative for acute intracranial hemorrhage, acute infarction, mass, mass effect, hydrocephalus or midline shift. Gray-white differentiation is preserved throughout. No acute soft tissue or calvarial abnormality. The globes and orbits are symmetric and unremarkable. Normal aeration of the mastoid air cells and visualized paranasal sinuses. IMPRESSION: Negative head CT. Electronically Signed   By: Jacqulynn Cadet M.D.   On: 04/28/2015 13:55   Dg Shoulder Left  04/28/2015  CLINICAL DATA:  Acute left shoulder pain after falling out of truck. Initial encounter. EXAM: LEFT SHOULDER - 2+ VIEW COMPARISON:  None. FINDINGS: Anterior dislocation of the proximal left humeral head is noted. No fracture is noted. Visualized ribs appear normal. IMPRESSION: Anterior dislocation of the proximal left humeral head. Electronically Signed   By: Marijo Conception, M.D.   On: 04/28/2015 14:10   Dg Shoulder Left Port  04/28/2015  CLINICAL DATA:  Post reduction EXAM: LEFT SHOULDER - 1 VIEW COMPARISON:  Left shoulder radiographs from earlier today FINDINGS: The left humeral head appears to articulate properly with the  left glenoid on this single frontal view. No left acromioclavicular joint separation. No fracture or suspicious focal osseous lesion on this single frontal view. Minimal osteoarthritis in the left acromioclavicular and inferior left glenohumeral joints. IMPRESSION: Apparent successful reduction of left shoulder dislocation on this single frontal view. No evident fracture on this single frontal view. Electronically Signed   By: Ilona Sorrel M.D.   On: 04/28/2015 16:49    EKG: Independently reviewed. Sinus tach at 10 9 bpm  Assessment/Plan Principal Problem:   Seizure ([place]) Active Problems:    Hypothyroidism   Hypertension, essential   Anterior shoulder dislocation    Seizures Tonic-clonic seizure with about 30 minutes postictal state according to patient second episode. First episode about 2 weeks ago. No recent changes in medications, the only changes her pharmacy changed the brand name of the Wellbutrin. Hold Wellbutrin, seizures precautions, Ativan as needed. Neurology ordered so patient in the emergency department, EEG and MRI pending.  Left-sided anterior shoulder dislocation This is reduced in the emergency department, continue sling.  Essential hypertension Continue home medications.  Hypothyroidism  On 175 g of Synthroid, continue and check TSH.  Code Status: Full code Family Communication: Plan discussed with the patient Disposition Plan: Neuro MedSurg bed.  Time spent: 70 minutes  Jamin Panther A, MD [facility] Pager [phone]

## 2015-04-28 NOTE — Progress Notes (Signed)
Orthopedic Tech Progress Note Patient Details:  Emma Glenn [birth year] [id number] Assisted with second reduction of dislocation.  Placed shoulder immobilizer back on pt. Patient ID: [name], female   DOB: 02-18-1965, 50 y.o.   MRN: [id number]   Darrol Poke 04/28/2015, 4:26 PM

## 2015-04-28 NOTE — Progress Notes (Signed)
EEG Completed; Results Pending  

## 2015-04-28 NOTE — ED Provider Notes (Signed)
CSN: [id number]     Arrival date & time 04/28/15  1216 History   First MD Initiated Contact with Patient 04/28/15 1219     Chief Complaint  Patient presents with  . Seizures  . Shoulder Injury   HPI  Emma Glenn is a 50 year old female with PMHx of HTN and Graves dz s/p radioactive iodine tx presenting with first time seizure. Information about the seizure was gathered from her husband. She was helping load stand onto a pickup truck when she began feeling confused. Her husband states that he sat her on the edge of the truck to calm her down. She is complaining of inability to concentrate or "manage her thoughts". Husband states that her body then went stiff and began jerking in all extremities. She fell to the ground struck her head on the back of his truck. He then lowered her to the ground where she continued to have seizure-like activity for approximately 3 minutes. He notes that she also struck her left shoulder on the way down. EMS was called and she was postictal for approximately 30 minutes. Patient has been state that she is back to her baseline currently. She denies memory of the event. She is only complaining of left shoulder pain. She states that the pain is severe and exacerbated by movement of the shoulder. She denies numbness, tingling, weakness or loss of sensation in the hand. She has a family history of seizure disorders. She does note that 2 weeks ago she had a similar prodromal event where she felt confused and could not "manage her thoughts". She did not have seizure-like activity at that time. She states that within the past 2 weeks, she started a new Wellbutrin. She states that she has been taking this for many months but the pill recently changed. She is unsure she was switched from brand to generic. She denies alcohol or drug use. She denies recent infectious symptoms. She states that she has been at her baseline and has had no complaints prior to this event. She denies current  headache, dizziness, vision changes, neck pain, chest pain, palpitations, abdominal pain, nausea or vomiting.  Past Medical History  Diagnosis Date  . CIN I (cervical intraepithelial neoplasia I)   . Labial cyst     Inclusion cyst  . Hypertension   . Thyroid disease     Graves dis-Radioactive Iodine   Past Surgical History  Procedure Laterality Date  . Tubal ligation    . Cervical biopsy  w/ loop electrode excision      Excision labial inclusion cyst  . Hand surgery    . Colposcopy     Family History  Problem Relation Age of Onset  . Hypertension Mother   . Heart disease Mother   . Diabetes Father   . Heart disease Father   . Heart disease Brother   . Breast cancer Maternal Grandmother    Social History  Substance Use Topics  . Smoking status: Former Smoker -- 1.00 packs/day    Types: Cigarettes  . Smokeless tobacco: None  . Alcohol Use: Yes     Comment: rare   OB History    Gravida Para Term Preterm AB TAB SAB Ectopic Multiple Living   1 1 1       1      Review of Systems  Musculoskeletal: Positive for arthralgias.  Neurological: Positive for seizures.  All other systems reviewed and are negative.     Allergies  Nitrofurantoin monohyd macro  Home Medications   Prior to Admission medications   Medication Sig Start Date End Date Taking? Authorizing Provider  buPROPion (WELLBUTRIN XL) 300 MG 24 hr tablet Take 900 mg by mouth daily.   Yes Historical Provider, MD  levothyroxine (SYNTHROID, LEVOTHROID) 175 MCG tablet Take 175 mcg by mouth daily before breakfast.   Yes Historical Provider, MD  losartan (COZAAR) 50 MG tablet Take 50 mg by mouth daily.   Yes Historical Provider, MD  omeprazole (PRILOSEC) 40 MG capsule Take 40 mg by mouth daily.   Yes Historical Provider, MD  phentermine 37.5 MG capsule Take 37.5 mg by mouth every morning.   Yes Historical Provider, MD  buPROPion (WELLBUTRIN XL) 150 MG 24 hr tablet Take 150 mg by mouth daily.      Historical  Provider, MD  LEVOTHYROXINE SODIUM PO Take 0.15 mcg by mouth.    Historical Provider, MD   BP 156/103 mmHg  Pulse 98  Temp(Src) 98.5 F (36.9 C) (Oral)  Resp 10  Ht 5' 1"  (1.549 m)  Wt 61.236 kg  BMI 25.52 kg/m2  SpO2 99%  LMP  (Within Weeks) Physical Exam  Constitutional: She appears well-developed and well-nourished. No distress.  HENT:  Head: Normocephalic and atraumatic.  Mouth/Throat: Oropharynx is clear and moist. No oropharyngeal exudate.  No oral trauma  Eyes: Conjunctivae and EOM are normal. Pupils are equal, round, and reactive to light. Right eye exhibits no discharge. Left eye exhibits no discharge.  Neck: Normal range of motion. Neck supple. No rigidity.  No cervical spinous TTP  Cardiovascular: Normal rate, regular rhythm, normal heart sounds and intact distal pulses.   Left radial pulses palpable and cap refill < 3 seconds  Pulmonary/Chest: Effort normal and breath sounds normal. No respiratory distress.  Abdominal: Soft. There is no tenderness. There is no rebound and no guarding.  Musculoskeletal:       Left shoulder: She exhibits decreased range of motion, tenderness and deformity. She exhibits normal pulse and normal strength.  Obvious deformity of the left shoulder. Generalized tenderness with restricted ROM. FROM of the wrist and digits. No tenderness below the elbow. No swelling of the LUE. Moves remaining extremities spontaneously and without pain.   Neurological: She is alert. No cranial nerve deficit. She exhibits normal muscle tone. Coordination normal.  Cranial nerves 3-12 tested and intact. 5/5 strength of all major muscle groups (only grip strength tested on left arm due to dislocation). Sensation to light touch intact throughout. Finger to nose coordinated. Walks with a steady gait unassisted.   Skin: Skin is warm and dry.  Psychiatric: She has a normal mood and affect. Her behavior is normal.  Nursing note and vitals reviewed.   ED Course  Reduction  of dislocation Date/Time: 04/28/2015 4:28 PM Performed by: Josephina Gip Authorized by: Josephina Gip Consent: Verbal consent obtained. Written consent obtained. Risks and benefits: risks, benefits and alternatives were discussed Consent given by: patient Patient understanding: patient states understanding of the procedure being performed Patient consent: the patient's understanding of the procedure matches consent given Procedure consent: procedure consent matches procedure scheduled Relevant documents: relevant documents present and verified Test results: test results available and properly labeled Imaging studies: imaging studies available Required items: required blood products, implants, devices, and special equipment available Patient identity confirmed: verbally with patient and arm band Time out: Immediately prior to procedure a "time out" was called to verify the correct patient, procedure, equipment, support staff and site/side marked as required. Patient sedated: yes Sedatives: propofol  Vitals: Vital signs were monitored during sedation. Patient tolerance: Patient tolerated the procedure well with no immediate complications   (including critical care time) Labs Review Labs Reviewed  CBC WITH DIFFERENTIAL/PLATELET - Abnormal; Notable for the following:    WBC 12.3 (*)    Neutro Abs 10.2 (*)    All other components within normal limits  COMPREHENSIVE METABOLIC PANEL - Abnormal; Notable for the following:    Creatinine, Ser 1.23 (*)    GFR calc non Af Amer 50 (*)    GFR calc Af Amer 58 (*)    All other components within normal limits  URINALYSIS, ROUTINE W REFLEX MICROSCOPIC (NOT AT [facility])  URINE RAPID DRUG SCREEN, HOSP PERFORMED  ETHANOL  CK  I-STAT BETA HCG BLOOD, ED ([facility], [facility], [facility] ONLY)    Imaging Review Ct Head Wo Contrast  04/28/2015  CLINICAL DATA:  50 year old female status post fall out of a truck following seizure-like activity EXAM: CT HEAD WITHOUT CONTRAST  TECHNIQUE: Contiguous axial images were obtained from the base of the skull through the vertex without intravenous contrast. COMPARISON:  None. FINDINGS: Negative for acute intracranial hemorrhage, acute infarction, mass, mass effect, hydrocephalus or midline shift. Gray-white differentiation is preserved throughout. No acute soft tissue or calvarial abnormality. The globes and orbits are symmetric and unremarkable. Normal aeration of the mastoid air cells and visualized paranasal sinuses. IMPRESSION: Negative head CT. Electronically Signed   By: Jacqulynn Cadet M.D.   On: 04/28/2015 13:55   Dg Shoulder Left  04/28/2015  CLINICAL DATA:  Acute left shoulder pain after falling out of truck. Initial encounter. EXAM: LEFT SHOULDER - 2+ VIEW COMPARISON:  None. FINDINGS: Anterior dislocation of the proximal left humeral head is noted. No fracture is noted. Visualized ribs appear normal. IMPRESSION: Anterior dislocation of the proximal left humeral head. Electronically Signed   By: Marijo Conception, M.D.   On: 04/28/2015 14:10   Dg Shoulder Left Port  04/28/2015  CLINICAL DATA:  Post reduction EXAM: LEFT SHOULDER - 1 VIEW COMPARISON:  Left shoulder radiographs from earlier today FINDINGS: The left humeral head appears to articulate properly with the left glenoid on this single frontal view. No left acromioclavicular joint separation. No fracture or suspicious focal osseous lesion on this single frontal view. Minimal osteoarthritis in the left acromioclavicular and inferior left glenohumeral joints. IMPRESSION: Apparent successful reduction of left shoulder dislocation on this single frontal view. No evident fracture on this single frontal view. Electronically Signed   By: Ilona Sorrel M.D.   On: 04/28/2015 16:49   I have personally reviewed and evaluated these images and lab results as part of my medical decision-making.   EKG Interpretation   Date/Time:  Friday April 28 2015 12:24:57 EDT Ventricular Rate:   109 PR Interval:  162 QRS Duration: 102 QT Interval:  353 QTC Calculation: 475 R Axis:   21 Text Interpretation:  Sinus tachycardia Low voltage, precordial leads  Consider anterior infarct no STEMI noted Normal sinus rhythm Confirmed by  Gerald Leitz ([id number]) on 04/28/2015 12:33:28 PM      MDM   Final diagnoses:  Seizure ([place])   50 year old female presenting with first time seizure. Seizure lasted approximately 3 minutes with tonic clonic movements. Pt post-ictal for 30 minutes with confusion. Pt amnesic of event. No longer post-ictal in ED. Hypertensive 150/110. Creatinine 1.23 without old for comparison. WBC elevated to 12.3. Non-focal neuro exam. Left shoulder obviously dislocated. Left hand remains neurovascularly intact. DG shoulder positive for anterior  dislocation. Attempted reduction in ED with propofol sedation. Consulted neuro for first time seizure who recommends MRI and EEG while in ED. Patient xray shows unsuccessful reduction of left shoulder. Neuro re-evaluated pt and she cannot undergo EEG with recent propofol. Pt sedated again with successful shoulder reduction by Dr. Lita Mains. Shoulder extremely unstable and will need ortho to follow. Neuro recommends admission and neuro will follow tomorrow. Discussed pt with Dr. Hartford Poli who will admit to [facility] for neuro to follow. Pt care signed out to Dr. Lita Mains who will discuss pt with orthopedics.     Josephina Gip, PA-C 04/28/15 1643  Bodee Lafoe, PA-C 04/28/15 1659  Ad Guttman, PA-C 04/28/15 1758  Courteney Julio Alm, MD 05/02/15 1523  Courteney Lyn Mackuen, MD 05/02/15 1524

## 2015-04-28 NOTE — ED Notes (Signed)
eeg at bedside

## 2015-04-28 NOTE — ED Notes (Signed)
Called [employer] for L shoulder mobilizer and to be present for reduction.

## 2015-04-28 NOTE — ED Notes (Signed)
Patient transported to X-ray 

## 2015-04-28 NOTE — ED Notes (Signed)
X ray  Done shoulder is still out of place , MD notified

## 2015-04-29 LAB — BASIC METABOLIC PANEL
ANION GAP: 13 (ref 5–15)
BUN: 8 mg/dL (ref 6–20)
CALCIUM: 8.8 mg/dL — AB (ref 8.9–10.3)
CHLORIDE: 104 mmol/L (ref 101–111)
CO2: 25 mmol/L (ref 22–32)
Creatinine, Ser: 0.93 mg/dL (ref 0.44–1.00)
GFR calc non Af Amer: >60 mL/min (ref >60)
GLUCOSE: 103 mg/dL — AB (ref 65–99)
POTASSIUM: 3.8 mmol/L (ref 3.5–5.1)
Sodium: 142 mmol/L (ref 135–145)

## 2015-04-29 LAB — CBC
HEMATOCRIT: 39.9 % (ref 36.0–46.0)
HEMOGLOBIN: 13.3 g/dL (ref 12.0–15.0)
MCH: 31.1 pg (ref 26.0–34.0)
MCHC: 33.3 g/dL (ref 30.0–36.0)
MCV: 93.2 fL (ref 78.0–100.0)
Platelets: 207 10*3/uL (ref 150–400)
RBC: 4.28 MIL/uL (ref 3.87–5.11)
RDW: 14.3 % (ref 11.5–15.5)
WBC: 9.2 10*3/uL (ref 4.0–10.5)

## 2015-04-29 MED ORDER — DIVALPROEX SODIUM 500 MG PO DR TAB: 500.0000 mg | DELAYED_RELEASE_TABLET | Freq: Two times a day (BID) | ORAL | Status: DC

## 2015-04-29 NOTE — Discharge Instructions (Signed)
Seizure, Adult A seizure means there is unusual activity in the brain. A seizure can cause changes in attention or behavior. Seizures often cause shaking (convulsions). Seizures often last from 30 seconds to 2 minutes. HOME CARE   If you are given medicines, take them exactly as told by your doctor.  Keep all doctor visits as told.  Do not swim or drive until your doctor says it is okay.  Teach others what to do if you have a seizure. They should:  Lay you on the ground.  Put a cushion under your head.  Loosen any tight clothing around your neck.  Turn you on your side.  Stay with you until you get better. GET HELP RIGHT AWAY IF:   The seizure lasts longer than 2 to 5 minutes.  The seizure is very bad.  The person does not wake up after the seizure.  The person's attention or behavior changes. Drive the person to the emergency room or call your local emergency services (911 in [place] MAKE SURE YOU:   Understand these instructions.  Will watch your condition.  Will get help right away if you are not doing well or get worse.   This information is not intended to replace advice given to you by your health care provider. Make sure you discuss any questions you have with your health care provider.   Document Released: 07/17/2007 Document Revised: 04/22/2011 Document Reviewed: 09/09/2012 Elsevier Interactive Patient Education 2016 [employer] Shoulder Dislocation Your shoulder joint is made up of 3 bones:  The upper arm bone (humerus).  The shoulder blade (scapula).  The collarbone (clavicle). A shoulder dislocation happens when your upper arm bone moves out of its normal place in your shoulder joint. HOME CARE If You Have a Splint or Sling:  Wear it as told by your doctor.  Take it off only as told by your doctor.  Loosen it if:  Your fingers become numb and tingly.  Your fingers turn cold and blue.  Keep it clean and dry. Bathing  Do not take baths,  swim, or use a hot tub until your doctor says you can. Ask your doctor if you can take showers. You may only be allowed to take sponge baths.  If your doctor says taking baths or showers is okay, cover your splint or sling with a plastic bag. Do not let the splint or sling get wet. Managing Pain, Stiffness, and Swelling  If told, put ice on the injured area.  Put ice in a plastic bag.  Place a towel between your skin and the bag.  Leave the ice on for 20 minutes, 2-3 times per day.  Move your fingers often to avoid stiffness and to lessen swelling.  Raise (elevate) the injured area above the level of your heart while you are sitting or lying down. Driving  Do not drive while you are wearing a splint or sling on a hand that you use for driving.  Do not drive or operate heavy machinery while taking pain medicine. Activity  Return to your normal activities as told by your doctor. Ask your doctor what activities are safe for you.  Do range-of-motion exercises only as told by your doctor.  Exercise your hand by squeezing a soft ball. This keeps your hand and wrist from getting stiff and swollen. General Instructions  Take over-the-counter and prescription medicines only as told by your doctor.  Do not use any tobacco products, including cigarettes, chewing tobacco, or e-cigarettes. Tobacco can  slow down healing. If you need help quitting, ask your doctor.  Keep all follow-up visits as told by your doctor. This is important. GET HELP IF:  Your splint or sling gets damaged. GET HELP RIGHT AWAY IF:  Your pain gets worse instead of better.  You lose feeling in your arm or hand.  Your arm or hand turns white and cold.   This information is not intended to replace advice given to you by your health care provider. Make sure you discuss any questions you have with your health care provider.   Document Released: 04/22/2011 Document Revised: 10/19/2014 Document Reviewed:  05/23/2014 Elsevier Interactive Patient Education [employer].

## 2015-04-29 NOTE — Procedures (Signed)
History: Emma Glenn is an 50 y.o. female patient with seizure.  Routine inpatient EEG was performed for further evaluation.   Patient Active Problem List   Diagnosis Date Noted  . Seizure ([place]) 04/28/2015  . Anterior shoulder dislocation 04/28/2015  . Hypertension, essential   . CIN I (cervical intraepithelial neoplasia I)   . Hypothyroidism   . Labial cyst      Current facility-administered medications:  .  0.9 %  sodium chloride infusion, , Intravenous, Continuous, Verlee Monte, MD, Last Rate: 10 mL/hr at 04/28/15 2248 .  acetaminophen (TYLENOL) tablet 650 mg, 650 mg, Oral, Q6H PRN **OR** acetaminophen (TYLENOL) suppository 650 mg, 650 mg, Rectal, Q6H PRN, Verlee Monte, MD .  divalproex (DEPAKOTE) DR tablet 500 mg, 500 mg, Oral, Q12H, Dreshaun Stene Fuller Mandril, MD, 500 mg at 04/29/15 0816 .  heparin injection 5,000 Units, 5,000 Units, Subcutaneous, 3 times per day, Verlee Monte, MD, 5,000 Units at 04/29/15 0540 .  HYDROcodone-acetaminophen (NORCO/VICODIN) 5-325 MG per tablet 1-2 tablet, 1-2 tablet, Oral, Q4H PRN, Verlee Monte, MD, 2 tablet at 04/29/15 0816 .  levothyroxine (SYNTHROID, LEVOTHROID) tablet 175 mcg, 175 mcg, Oral, QAC breakfast, Verlee Monte, MD, 175 mcg at 04/29/15 0816 .  LORazepam (ATIVAN) injection 2 mg, 2 mg, Intravenous, Q2H PRN, Verlee Monte, MD .  losartan (COZAAR) tablet 50 mg, 50 mg, Oral, Daily, Verlee Monte, MD .  morphine 2 MG/ML injection 1 mg, 1 mg, Intravenous, Q4H PRN, Verlee Monte, MD, 1 mg at 04/29/15 0144 .  ondansetron (ZOFRAN) tablet 4 mg, 4 mg, Oral, Q6H PRN **OR** ondansetron (ZOFRAN) injection 4 mg, 4 mg, Intravenous, Q6H PRN, Verlee Monte, MD .  pantoprazole (PROTONIX) EC tablet 40 mg, 40 mg, Oral, Daily, Verlee Monte, MD, 40 mg at 04/28/15 2241 .  sodium chloride flush (NS) 0.9 % injection 3 mL, 3 mL, Intravenous, Q12H, Verlee Monte, MD, 3 mL at 04/28/15 2242   Introduction:  This is a 19 channel routine scalp EEG performed at the  bedside with bipolar and monopolar montages arranged in accordance to the international 10/20 system of electrode placement. One channel was dedicated to EKG recording.   Findings:  The background rhythm was normal 9-10 Hz alpha . No definite evidence of abnormal epileptiform discharges or electrographic seizures were noted during this recording.   Impression:  Unremarkable awake and drowsy routine inpatient EEG. Clinical correlation is recommended .

## 2015-04-29 NOTE — Progress Notes (Addendum)
Pt discharged home with family, by car, assessment stable, prescriptions given, discharge instructions reviewed, all questions answered. IV removed. Telemetry removed. Pt taken by wheelchair to exit. Time of discharge: 1515

## 2015-04-29 NOTE — Discharge Summary (Addendum)
Physician Discharge Summary  Cagney Steenson HWE:[id number] DOB: 1965-06-23 DOA: 04/28/2015  PCP: Shirline Frees, MD  Admit date: 04/28/2015 Discharge date: 04/29/2015  Time spent: 40 minutes  Recommendations for Outpatient Follow-up:  1. Follow-up with primary care physician within one week. 2. Follow-up with Neurology as outpatient. 3. 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   Discharge Diagnoses:  Principal Problem:   Seizure [facility]) Active Problems:   Hypothyroidism   Hypertension, essential   Anterior shoulder dislocation   Discharge Condition: Stable  Diet recommendation: Heart healthy  Filed Weights   04/28/15 1223  Weight: 61.236 kg (135 lb)    History of present illness:  Emma Glenn is a 50 y.o. female with past medical history of hypertension and Graves' disease status post radioactive iodine, she came into the hospital because of seizures. Patient was helping her husband with loading something to his pickup truck and she developed some confusion and "uncontrolled thoughts" after that she developed tonic-clonic seizures and fell and her head struck the back of the truck, she stayed confused for about 30 minutes after the episode. Question brought to the hospital for further evaluation. In the ED x-rays showed left anterior shoulder dislocation which is reduced after patient given propofol, CT scan of the head showed no acute abnormalities, patient seen by neurology and recommended admission to the hospital for further neurological evaluation.  Hospital Course:   Seizures Tonic-clonic seizure with about 30 minutes postictal state according to patient second episode. She had a previous episode of confusion about 2 weeks ago without tonic-clonic seizure. No recent changes in medications, the only changes her pharmacy changed the brand name of the Wellbutrin. Wellbutrin discontinued, she is on such high  dose of 900 mg daily MRI is negative, EEG without epileptiform waves. Seen by neurology, although this is the very first seizure but it was significant to the point she had anterior shoulder dislocation with it, so they put her on Depakote 500 mg twice a day. Follow-up with neurology as outpatient, advanced not to drive.  Left-sided anterior shoulder dislocation This is reduced in the emergency department, continue sling. 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 She also just placed on new medications (Depakote), I wrote a letter for her to go back to work on Monday, 05/08/2015.  Essential hypertension Continue home medications.  Hypothyroidism  On 175 g of Synthroid, TSH is normal at 2.2338, continue Synthroid at same dosage.   Procedures:  EEG done on 04/28/2015 read by Dr. Silverio Decamp Impression:  Unremarkable awake and drowsy routine inpatient EEG. Clinical correlation is recommended .   Consultations:  Neurology  Discharge Exam: Filed Vitals:   04/29/15 0456 04/29/15 0949  BP: 129/79 118/74  Pulse: 84 87  Temp: 98.6 F (37 C) 98.6 F (37 C)  Resp: 18 18  General: Alert and awake, oriented x3, not in any acute distress. HEENT: anicteric sclera, pupils reactive to light and accommodation, EOMI CVS: S1-S2 clear, no murmur rubs or gallops Chest: clear to auscultation bilaterally, no wheezing, rales or rhonchi Abdomen: soft nontender, nondistended, normal bowel sounds, no organomegaly Extremities: no cyanosis, clubbing or edema noted bilaterally Neuro: Cranial nerves II-XII intact, no focal neurological deficits  Discharge Instructions   Discharge Instructions    Diet - low sodium heart healthy    Complete by:  As directed  Increase activity slowly    Complete by:  As directed           Current Discharge Medication List    START taking these medications   Details  divalproex  (DEPAKOTE) 500 MG DR tablet Take 1 tablet (500 mg total) by mouth every 12 (twelve) hours. Qty: 60 tablet, Refills: 0      CONTINUE these medications which have NOT CHANGED   Details  levothyroxine (SYNTHROID, LEVOTHROID) 175 MCG tablet Take 175 mcg by mouth daily before breakfast.    losartan (COZAAR) 50 MG tablet Take 50 mg by mouth daily.    omeprazole (PRILOSEC) 40 MG capsule Take 40 mg by mouth daily.    phentermine 37.5 MG capsule Take 37.5 mg by mouth every morning.      STOP taking these medications     buPROPion (WELLBUTRIN XL) 300 MG 24 hr tablet      buPROPion (WELLBUTRIN XL) 150 MG 24 hr tablet        Allergies  Allergen Reactions  . Nitrofurantoin Monohyd Macro    Follow-up Information    Follow up with Shirline Frees, MD In 1 week.   Specialty:  Family Medicine   Contact information:   [address] [place] [address] [phone]        The results of significant diagnostics from this hospitalization (including imaging, microbiology, ancillary and laboratory) are listed below for reference.    Significant Diagnostic Studies: Ct Head Wo Contrast  04/28/2015  CLINICAL DATA:  50 year old female status post fall out of a truck following seizure-like activity EXAM: CT HEAD WITHOUT CONTRAST TECHNIQUE: Contiguous axial images were obtained from the base of the skull through the vertex without intravenous contrast. COMPARISON:  None. FINDINGS: Negative for acute intracranial hemorrhage, acute infarction, mass, mass effect, hydrocephalus or midline shift. Gray-white differentiation is preserved throughout. No acute soft tissue or calvarial abnormality. The globes and orbits are symmetric and unremarkable. Normal aeration of the mastoid air cells and visualized paranasal sinuses. IMPRESSION: Negative head CT. Electronically Signed   By: Jacqulynn Cadet M.D.   On: 04/28/2015 13:55   Mr Jeri Cos NP Contrast  04/28/2015  CLINICAL DATA:  Initial  evaluation for acute seizure. EXAM: MRI HEAD WITHOUT AND WITH CONTRAST TECHNIQUE: Multiplanar, multiecho pulse sequences of the brain and surrounding structures were obtained without and with intravenous contrast. CONTRAST:  63m MULTIHANCE GADOBENATE DIMEGLUMINE 529 MG/ML IV SOLN COMPARISON:  Prior CT from earlier the same day. FINDINGS: The CSF containing spaces are within normal limits for patient age. No focal parenchymal signal abnormality is identified. No mass lesion, midline shift, or extra-axial fluid collection. Ventricles are normal in size without evidence of hydrocephalus. No diffusion-weighted signal abnormality is identified to suggest acute intracranial infarct. Gray-white matter differentiation is maintained. Normal flow voids are seen within the intracranial vasculature. No intracranial hemorrhage identified. The cervicomedullary junction is normal. Pituitary gland is within normal limits. Pituitary stalk is midline. The globes and optic nerves demonstrate a normal appearance with normal signal intensity. No abnormal enhancement. The bone marrow signal intensity is normal. Calvarium is intact. Visualized upper cervical spine is within normal limits. Scalp soft tissues are unremarkable. Paranasal sinuses are clear. Trace opacity within the bilateral mastoid air cells. Inner ear structures grossly normal. IMPRESSION: Normal MRI of the brain. Electronically Signed   By: BJeannine BogaM.D.   On: 04/28/2015 23:17   Dg Shoulder Left  04/28/2015  CLINICAL DATA:  Acute left shoulder pain  after falling out of truck. Initial encounter. EXAM: LEFT SHOULDER - 2+ VIEW COMPARISON:  None. FINDINGS: Anterior dislocation of the proximal left humeral head is noted. No fracture is noted. Visualized ribs appear normal. IMPRESSION: Anterior dislocation of the proximal left humeral head. Electronically Signed   By: Marijo Conception, M.D.   On: 04/28/2015 14:10   Dg Shoulder Left Port  04/28/2015  CLINICAL  DATA:  Post reduction EXAM: LEFT SHOULDER - 1 VIEW COMPARISON:  Left shoulder radiographs from earlier today FINDINGS: The left humeral head appears to articulate properly with the left glenoid on this single frontal view. No left acromioclavicular joint separation. No fracture or suspicious focal osseous lesion on this single frontal view. Minimal osteoarthritis in the left acromioclavicular and inferior left glenohumeral joints. IMPRESSION: Apparent successful reduction of left shoulder dislocation on this single frontal view. No evident fracture on this single frontal view. Electronically Signed   By: Ilona Sorrel M.D.   On: 04/28/2015 16:49    Microbiology: No results found for this or any previous visit (from the past 240 hour(s)).   Labs: Basic Metabolic Panel:  Recent Labs Lab 04/28/15 1315 04/29/15 0631  NA 142 142  K 4.6 3.8  CL 107 104  CO2 23 25  GLUCOSE 86 103*  BUN 15 8  CREATININE 1.23* 0.93  CALCIUM 9.3 8.8*   Liver Function Tests:  Recent Labs Lab 04/28/15 1315  AST 23  ALT 21  ALKPHOS 68  BILITOT 0.3  PROT 6.9  ALBUMIN 3.9   No results for input(s): LIPASE, AMYLASE in the last 168 hours. No results for input(s): AMMONIA in the last 168 hours. CBC:  Recent Labs Lab 04/28/15 1315 04/29/15 0631  WBC 12.3* 9.2  NEUTROABS 10.2*  --   HGB 14.4 13.3  HCT 40.7 39.9  MCV 91.7 93.2  PLT 235 207   Cardiac Enzymes:  Recent Labs Lab 04/28/15 1708  CKTOTAL 197   BNP: BNP (last 3 results) No results for input(s): BNP in the last 8760 hours.  ProBNP (last 3 results) No results for input(s): PROBNP in the last 8760 hours.  CBG: No results for input(s): GLUCAP in the last 168 hours.     Signed:  Birdie Hopes MD.  [facility] 04/29/2015, 11:13 AM

## 2015-05-01 LAB — HEMOGLOBIN A1C
Hgb A1c MFr Bld: 5.6 % (ref 4.8–5.6)
MEAN PLASMA GLUCOSE: 114 mg/dL

## 2015-05-10 ENCOUNTER — Other Ambulatory Visit: Payer: Self-pay | Admitting: Orthopedic Surgery

## 2015-05-10 DIAGNOSIS — M25512 Pain in left shoulder: Secondary | ICD-10-CM

## 2015-05-11 ENCOUNTER — Ambulatory Visit (INDEPENDENT_AMBULATORY_CARE_PROVIDER_SITE_OTHER): Payer: BLUE CROSS/BLUE SHIELD | Admitting: Neurology

## 2015-05-11 ENCOUNTER — Encounter: Payer: Self-pay | Admitting: Neurology

## 2015-05-11 VITALS — BP 124/88 | HR 88 | Ht 61.5 in | Wt 154.4 lb

## 2015-05-11 DIAGNOSIS — G40409 Other generalized epilepsy and epileptic syndromes, not intractable, without status epilepticus: Secondary | ICD-10-CM | POA: Diagnosis not present

## 2015-05-11 NOTE — Progress Notes (Addendum)
[facility]    Provider:  Dr Jaynee Eagles Referring Provider: Shirline Frees, MD Primary Care Physician:  Shirline Frees, MD  CC:  seizure  HPI:  Emma Glenn is a 50 y.o. female here as a referral from Dr. Kenton Kingfisher for seizure. PMHx HTN, thyroid disease. Never had a seizure, no family history of seizure. There was a mistake and she was prescribed 975m of Wellbutrin a day. She was titrating up. She was on 454mfor several months, her new prescription was 30045mhree times a day. When she started taking those she had several episodes of confusion, not knowing the family, she kept repeating herslef. Three of those then a seizure. 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 The next thing she knew she saw EMS and her arm hurt. Daughter also here and provides information and says patient was digging with no sand and then she fell and hit her shoulder full convulsions for 3-4 minutes with confusion afterwards. She was placed on Depakote in the ED. She was taking phentermine along with the Wellbutrin. No current complaints and patient feels fine and back to baseline since discontinuing both the Wellbutrin and phentermine. No focal neurologic deficits.   Reviewed notes, labs and imaging from outside physicians, which showed:  EEG 04/29/2015: Unremarkable awake and drowsy routine inpatient EEG.   MRI of the brain: personally reviewed images, normal MRI of the brain.  Review of Systems: Patient complains of symptoms per HPI as well as the following symptoms: no CP. No SOB. Pertinent negatives per HPI. All others negative.   Social History   Social History  . Marital Status: Married    Spouse Name: RanTommie Raymond Number of Children: 1  . Years of Education: 12   Occupational History  . Not on file.   Social History Main Topics  . Smoking status: Former Smoker -- 1.00 packs/day    Types: Cigarettes  . Smokeless tobacco:  Not on file  . Alcohol Use: 0.0 oz/week    0 Standard drinks or equivalent per week     Comment: rare  . Drug Use: No     Comment: Hx of marijuana use  . Sexual Activity: Yes    Birth Control/ Protection: Surgical   Other Topics Concern  . Not on file   Social History Narrative   Lives with husband and sister   Caffeine use: Tea/coffee daily       Family History  Problem Relation Age of Onset  . Hypertension Mother   . Heart disease Mother   . Diabetes Father   . Heart disease Father   . Heart disease Brother   . Breast cancer Maternal Grandmother   . Seizures Neg Hx     Past Medical History  Diagnosis Date  . CIN I (cervical intraepithelial neoplasia I)   . Labial cyst     Inclusion cyst  . Hypertension   . Thyroid disease     Graves dis-Radioactive Iodine    Past Surgical History  Procedure Laterality Date  . Tubal ligation    . Cervical biopsy  w/ loop electrode excision      Excision labial inclusion cyst  . Hand surgery    . Colposcopy      Current Outpatient Prescriptions  Medication Sig Dispense Refill  . divalproex (DEPAKOTE) 500 MG DR tablet Take 1 tablet (500 mg total) by mouth every 12 (twelve) hours. 60 tablet 0  .  levothyroxine (SYNTHROID, LEVOTHROID) 175 MCG tablet Take 175 mcg by mouth daily before breakfast.    . losartan (COZAAR) 50 MG tablet Take 50 mg by mouth daily.    Marland Kitchen omeprazole (PRILOSEC) 40 MG capsule Take 40 mg by mouth daily.     No current facility-administered medications for this visit.    Allergies as of 05/11/2015 - Review Complete 04/28/2015  Allergen Reaction Noted  . Nitrofurantoin monohyd macro  02/19/2011    Vitals: BP 124/88 mmHg  Pulse 88  Ht 5' 1.5" (1.562 m)  Wt 154 lb 6.4 oz (70.035 kg)  BMI 28.70 kg/m2  SpO2 98%  LMP  (Within Weeks) Last Weight:  Wt Readings from Last 1 Encounters:  05/11/15 154 lb 6.4 oz (70.035 kg)   Last Height:   Ht Readings from Last 1 Encounters:  05/11/15 5' 1.5" (1.562 m)     Physical exam: Exam: Gen: NAD, conversant, well nourised, overweight, well groomed                     CV: RRR, no MRG. No Carotid Bruits. No peripheral edema, warm, nontender Eyes: Conjunctivae clear without exudates or hemorrhage  Neuro: Detailed Neurologic Exam  Speech:    Speech is normal; fluent and spontaneous with normal comprehension.  Cognition:    The patient is oriented to person, place, and time;     recent and remote memory intact;     language fluent;     normal attention, concentration,     fund of knowledge Cranial Nerves:    The pupils are equal, round, and reactive to light. The fundi are normal and spontaneous venous pulsations are present. Visual fields are full to finger confrontation. Extraocular movements are intact. Trigeminal sensation is intact and the muscles of mastication are normal. The face is symmetric. The palate elevates in the midline. Hearing intact. Voice is normal. Shoulder shrug is normal. The tongue has normal motion without fasciculations.   Coordination:    Normal finger to nose and heel to shin. (cannot test left arm, in a sling due to injury)  Gait:    Gait normal  Motor Observation:    No asymmetry, no atrophy, and no involuntary movements noted. Tone:    Normal muscle tone.    Posture:    Posture is normal. normal erect    Strength:    Strength is V/V in the upper and lower limbs. (cannot test left arm, in a sling due to injury)     Sensation: intact to LT     Reflex Exam:  DTR's:    Deep tendon reflexes in the upper and lower extremities are symmetrical bilaterally.   Toes:    The toes are downgoing bilaterally.   Clonus:    Clonus is absent.      Assessment/Plan:   50 y.o. female here as a referral from Dr. Kenton Kingfisher for seizure. PMHx HTN, thyroid disease. She was taking large dosage of Wellbutrin which was an error. No family history of seizures or previous history of seizures. Episodes of confusion started after  taking Wellbutrin 900 mg daily until she had a generalized tonic-clonic seizure with dislocation of her left arm and postictal confusion. MRI of the brain was normal. Routine EEG was normal. Likely secondary to Wellbutrin as this medication can cause seizures. Had a long talk with daughter and mother, need to rule out that patient doesn't have  a lowered seizure threshold which was unmasked by Wellbutrin however the Wellbutrin likely  caused the seizure regardless.    - Patient was started on Depakote will continue. - Will order a 3 day ambulatory EEG. - If 3 day ambulatory EEG is negative and patient does not have anymore episodes, feel she can discontinue the Depakote. Patient also has an anxiety disorder and Depakote improves that she may just stay on it regardless. - If EEG abnormal, recommend continuing Depakote -Patient is unable to drive, operate heavy machinery, perform activities at heights or participate in water activities until 6 months seizure free  Addendum 06/28/2015: EEG results 06/09/2015 were abnormal as below: This is a 71-hour video ambulatory EEG study, recorded from May 26, 2015 to May 29, 2015.    This was an abnormal prolonged ambulatory 72-hour video EEG.  Occasional epileptiform discharges seen in the left anterior temporal region (T3). These were present during sleep. Otherwise no electrographic or electroclinical events present. There was no focal or background slowing seen.  There were 1 push button events (patient logged having a headache very slowly. There was no video correlate to review as the patient was off camera but the EEG tracing was reviewed carefully). The EE did not correlate with any changes on the EEG.  Owing to this prolonged VEEG being abnormal showing focal discharges in the left temporal region, consider neuroimaging (brain MRI) to rule out a temporal lobe lesion if not previous or recent study done. Maizey is already on an anticonvulsant medication  (Divalproex) although she may have been placed on this for other reasons since she does not appear to have a history of prior seizures. Consider checking levels and optimizing the medication as necessary.  Sarina Ill, MD  [facility] [address] [place], [place] [address]  Phone [phone] Fax [phone]

## 2015-05-11 NOTE — Patient Instructions (Addendum)
Remember to drink plenty of fluid, eat healthy meals and do not skip any meals. Try to eat protein with a every meal and eat a healthy snack such as fruit or nuts in between meals. Try to keep a regular sleep-wake schedule and try to exercise daily, particularly in the form of walking, 20-30 minutes a day, if you can.   As far as your medications are concerned, I would like to suggest: Continue Depakote  As far as diagnostic testing: 3-day EEG  Our phone number is [phone]. We also have an after hours call service for urgent matters and there is a physician on-call for urgent questions. For any emergencies you know to call 911 or go to the nearest emergency room

## 2015-05-16 ENCOUNTER — Encounter: Payer: Self-pay | Admitting: *Deleted

## 2015-05-16 NOTE — Progress Notes (Signed)
Faxed completed form to [facility] to schedule pt for in-home 72-hour EEG. Fax: [phone]. Received confirmation.  Sent copy to MR.

## 2015-05-16 NOTE — Progress Notes (Signed)
Received physician status notification from [facility] that they received referral and they will contact us once pt is scheduled for 72hr AMG EEG.

## 2015-05-24 ENCOUNTER — Ambulatory Visit
Admission: RE | Admit: 2015-05-24 | Discharge: 2015-05-24 | Disposition: A | Discharge status: Home / Self Care | Payer: BLUE CROSS/BLUE SHIELD | Source: Ambulatory Visit | Attending: Orthopedic Surgery | Admitting: Orthopedic Surgery

## 2015-05-24 DIAGNOSIS — M25512 Pain in left shoulder: Secondary | ICD-10-CM

## 2015-05-24 MED ORDER — IOHEXOL 180 MG/ML  SOLN: 15.0000 mL | Freq: Once | INTRAMUSCULAR | Status: DC | PRN

## 2015-05-30 NOTE — Progress Notes (Signed)
Received physician status notification from [facility] that the study is completed and they will send notification with 48 hr to let us know report is scanned and generated for Dr Junius Argyle to review and interpret from their company.

## 2015-06-01 ENCOUNTER — Telehealth: Payer: Self-pay | Admitting: Neurology

## 2015-06-01 NOTE — Telephone Encounter (Signed)
Patient is calling to get EEG results and to discuss if it is ok to take divalproex (DEPAKOTE) 500 MG DR tablet with Lorazepam 0.73m. Please call and advise. Thank you..Marland Kitchen

## 2015-06-01 NOTE — Telephone Encounter (Signed)
Dr Jaynee Eagles- FYI  Called pt back. Advised we are still waiting on results and will call her once we receive them. She would like a copy once we receive them. Advised she can come to office and sign record release form to receive a copy. She understands. Per Dr Jaynee Eagles- she can take depakote and lorazepam together. She has not taken these two together before. Pt states she thinks she is having a little anxiety. Advised she should not operate heavy machinery or drive until she knows how she is affected by taking both medications together. She should take at home first. She verbalized understanding. She wanted an earlier f/u. Advised Dr Jaynee Eagles does not have anything earlier. I can ask Dr Jaynee Eagles if we can move to an earlier time. Pt and I agreed that she is going to wait and see what the EEG results show first before moving her appt. Pt states she is having surgery on 07/14/15.

## 2015-06-05 ENCOUNTER — Other Ambulatory Visit: Payer: Self-pay | Admitting: Orthopedic Surgery

## 2015-06-06 ENCOUNTER — Other Ambulatory Visit: Payer: Self-pay | Admitting: Orthopedic Surgery

## 2015-06-07 ENCOUNTER — Telehealth: Payer: Self-pay | Admitting: Neurology

## 2015-06-07 NOTE — Telephone Encounter (Signed)
Called [facility]. Advised we have not received EEG results yet. Spoke to [place]. She will fax report to [phone].

## 2015-06-07 NOTE — Telephone Encounter (Signed)
Called pt back. Relayed information below. Advised report not ready yet. I will call her back and update her once I hear from Tammee. She verbalized understanding.  She should not be driving until we know what results are and until she gets clearance from Dr Jaynee Eagles. She verbalized understanding.

## 2015-06-07 NOTE — Telephone Encounter (Signed)
Patient called to get test results and to find out if she can drive again.

## 2015-06-07 NOTE — Telephone Encounter (Signed)
Dr Jaynee Eagles- FYI  Tammee from [facility] called back. She stated they sent Dr Junius Argyle a reminder message to review and sign off on report on 06/02/15. They will send report once Dr Junius Argyle signs off.

## 2015-06-07 NOTE — Telephone Encounter (Signed)
Received fax about EEG results. Looks Like Dr Junius Argyle has not reviewed and signed off on report yet. Called [facility] back. Spoke to [employer]. She is going to check to see when report will be ready and call back at [phone].

## 2015-06-08 ENCOUNTER — Encounter (HOSPITAL_COMMUNITY): Payer: Self-pay

## 2015-06-08 ENCOUNTER — Encounter (HOSPITAL_COMMUNITY)
Admission: RE | Admit: 2015-06-08 | Discharge: 2015-06-08 | Disposition: A | Discharge status: Home / Self Care | Payer: BLUE CROSS/BLUE SHIELD | Source: Ambulatory Visit | Attending: Orthopedic Surgery | Admitting: Orthopedic Surgery

## 2015-06-08 DIAGNOSIS — Z01818 Encounter for other preprocedural examination: Secondary | ICD-10-CM | POA: Diagnosis present

## 2015-06-08 DIAGNOSIS — R569 Unspecified convulsions: Secondary | ICD-10-CM | POA: Insufficient documentation

## 2015-06-08 DIAGNOSIS — I10 Essential (primary) hypertension: Secondary | ICD-10-CM | POA: Insufficient documentation

## 2015-06-08 DIAGNOSIS — Z79899 Other long term (current) drug therapy: Secondary | ICD-10-CM | POA: Diagnosis not present

## 2015-06-08 DIAGNOSIS — M75102 Unspecified rotator cuff tear or rupture of left shoulder, not specified as traumatic: Secondary | ICD-10-CM | POA: Diagnosis not present

## 2015-06-08 DIAGNOSIS — Z01812 Encounter for preprocedural laboratory examination: Secondary | ICD-10-CM | POA: Diagnosis not present

## 2015-06-08 DIAGNOSIS — E039 Hypothyroidism, unspecified: Secondary | ICD-10-CM | POA: Insufficient documentation

## 2015-06-08 DIAGNOSIS — Z87891 Personal history of nicotine dependence: Secondary | ICD-10-CM | POA: Diagnosis not present

## 2015-06-08 HISTORY — DX: Hypothyroidism, unspecified: E03.9

## 2015-06-08 HISTORY — DX: Unspecified convulsions: R56.9

## 2015-06-08 HISTORY — DX: Gastro-esophageal reflux disease without esophagitis: K21.9

## 2015-06-08 LAB — HCG, SERUM, QUALITATIVE: Preg, Serum: NEGATIVE

## 2015-06-08 LAB — CBC
HEMATOCRIT: 42.2 % (ref 36.0–46.0)
Hemoglobin: 14.2 g/dL (ref 12.0–15.0)
MCH: 31.5 pg (ref 26.0–34.0)
MCHC: 33.6 g/dL (ref 30.0–36.0)
MCV: 93.6 fL (ref 78.0–100.0)
PLATELETS: 210 10*3/uL (ref 150–400)
RBC: 4.51 MIL/uL (ref 3.87–5.11)
RDW: 13.4 % (ref 11.5–15.5)
WBC: 8.4 10*3/uL (ref 4.0–10.5)

## 2015-06-08 LAB — BASIC METABOLIC PANEL
ANION GAP: 13 (ref 5–15)
BUN: 14 mg/dL (ref 6–20)
CALCIUM: 9.1 mg/dL (ref 8.9–10.3)
CO2: 23 mmol/L (ref 22–32)
Chloride: 106 mmol/L (ref 101–111)
Creatinine, Ser: 0.82 mg/dL (ref 0.44–1.00)
GLUCOSE: 97 mg/dL (ref 65–99)
POTASSIUM: 4.4 mmol/L (ref 3.5–5.1)
Sodium: 142 mmol/L (ref 135–145)

## 2015-06-08 MED ORDER — CHLORHEXIDINE GLUCONATE 4 % EX LIQD: 60.0000 mL | Freq: Once | CUTANEOUS | Status: DC

## 2015-06-08 NOTE — Pre-Procedure Instructions (Signed)
    MELENY TREGONING  06/08/2015      [facility] - [place], [place] - [address] [place] [address] Phone: [phone] Fax: [phone]    Your procedure is scheduled on Jun 12, 2105.  Report to [facility] Admitting at 5:30 A.M.  Call this number if you have problems the morning of surgery:  [phone]   For questions prior to surgery call [phone], 8a-4p   Remember:  Do not eat food or drink liquids after midnight.  Take these medicines the morning of surgery with A SIP OF WATER : divalproex (DEPAKOTE), levothyroxine (SYNTHROID, LEVOTHROID), omeprazole (PRILOSEC)   STOP ASPIRIN, NSAID'S(IBUPROFEN, ADVIL), HERBAL MEDICATIONS ONE WEEK PRIOR TO SURGERY   Do not wear jewelry, make-up or nail polish.  Do not wear lotions, powders, or perfumes.  You may wear deodorant.  Do not shave 48 hours prior to surgery.    Do not bring valuables to the hospital.  [facility] is not responsible for any belongings or valuables.  Contacts, dentures or bridgework may not be worn into surgery.  Leave your suitcase in the car.  After surgery it may be brought to your room.  For patients admitted to the hospital, discharge time will be determined by your treatment team.  Patients discharged the day of surgery will not be allowed to drive home.   Name and phone number of your driver:    Special instructions:  "PREPARING FOR SURGERY"  Please read over the following fact sheets that you were given. Pain Booklet, Coughing and Deep Breathing and Surgical Site Infection Prevention

## 2015-06-09 NOTE — Telephone Encounter (Signed)
Received faxed report with results from [facility] that report interpretation completed and report attached from Dr. Junius Argyle. Will give to Dr Jaynee Eagles next week when back in office to review and call pt with results.

## 2015-06-09 NOTE — Progress Notes (Addendum)
Anesthesia Chart Review:  Pt is a 50 year old female scheduled for L shoulder arthroscopy, debridement, open rotator cuff repair, possible coracoid fracture fixation, biceps tenodesis on 06/13/2015 with Dr. Marlou Sa.   PCP is Dr. Shirline Frees.   PMH includes:  HTN, hypothyroidism, seizures, GERD. Former smoker. BMI 30  Pt hospitalized 3/17-3/18/17 for new onset seizures; thought to be due to high dose/overdose wellbutrin. Complicated by L anterior shoulder dislocation.   Pt had follow up neuro, had EEG  that showed no definite evidence of abnormal epileptiform discharges or electrographic seizures noted during this recording. Impression:  Unremarkable awake and drowsy routine inpatient EEG. Clinical correlation is recommended .   Medications include: depakote, levothyroxine, losartan, prilosec.   Preoperative labs reviewed.    EKG 04/28/15: Sinus tachycardia (109). Low voltage, precordial leads. Consider anterior infarct. Compared to 06/20/14 tracing poor R wave progression is more pronounced.   If no changes, I anticipate pt can proceed with surgery as scheduled.   Willeen Cass, FNP-BC [facility]/Anesthesiology Phone: [phone] 06/12/2015 2:57 PM

## 2015-06-10 NOTE — Telephone Encounter (Signed)
Thank you please give it to me next week thanks.

## 2015-06-12 NOTE — H&P (Signed)
Emma Glenn is an 50 y.o. female.   Chief Complaint: Left shoulder pain HPI: Emma Glenn is a 50 year old patient with left shoulder pain.  She had a seizure and sounds like had a shoulder dislocation.  Reported substantial pain and weakness in the shoulder since that time MRI scan demonstrates cortical fracture along with tearing of the supraspinatus infraspinatus and subscap with medial subluxation of the biceps.  Addition the patient also has a coracoid fracture.  This is confirmed on MRI scan.  Patient reports significant pain and disability in the left shoulder region. Past Medical History  Diagnosis Date  . CIN I (cervical intraepithelial neoplasia I)   . Labial cyst     Inclusion cyst  . Hypertension   . Thyroid disease     Graves dis-Radioactive Iodine  . Hypothyroidism   . GERD (gastroesophageal reflux disease)   . Seizures ([place])     last seizure 3/17? over dose wellbutrin    Past Surgical History  Procedure Laterality Date  . Tubal ligation    . Cervical biopsy  w/ loop electrode excision      Excision labial inclusion cyst  . Hand surgery    . Colposcopy      Family History  Problem Relation Age of Onset  . Hypertension Mother   . Heart disease Mother   . Diabetes Father   . Heart disease Father   . Heart disease Brother   . Breast cancer Maternal Grandmother   . Seizures Neg Hx    Social History:  reports that she has quit smoking. Her smoking use included Cigarettes. She smoked 1.00 pack per day. She does not have any smokeless tobacco history on file. She reports that she drinks alcohol. She reports that she does not use illicit drugs.  Allergies:  Allergies  Allergen Reactions  . Doxycycline Other (See Comments)    Flu like symptoms (high fever, chills)  . Nitrofurantoin Monohyd Macro Other (See Comments)    Flu like symptoms (high fever, chills)  . Septra [Sulfamethoxazole-Trimethoprim] Other (See Comments)    Flu like symptoms (high fever, chills)     No prescriptions prior to admission    No results found for this or any previous visit (from the past 48 hour(s)). No results found.  Review of Systems  Constitutional: Negative.   HENT: Negative.   Eyes: Negative.   Respiratory: Negative.   Cardiovascular: Negative.   Gastrointestinal: Negative.   Genitourinary: Negative.   Musculoskeletal: Positive for joint pain.  Skin: Negative.   Neurological: Negative.   Endo/Heme/Allergies: Negative.   Psychiatric/Behavioral: Negative.     There were no vitals taken for this visit. Physical Exam  Constitutional: She appears well-developed.  HENT:  Head: Normocephalic.  Eyes: Pupils are equal, round, and reactive to light.  Neck: Normal range of motion.  Cardiovascular: Normal rate.   Respiratory: Effort normal.  Neurological: She is alert.  Skin: Skin is warm.  Psychiatric: She has a normal mood and affect.   examination of the left shoulder demonstrates that the deltoid muscle does fire but she has pre-significant weakness with abduction and forward flexion.  Motor sensory function to the hand is intact radial pulses intact no real bruising ecchymosis or swelling around the left shoulder region coracoid process is tender to palpation supination biceps strength is weak.  Assessment/Plan Impression is left shoulder dislocation episode with significant rotator cuff tearing and coracoid fracture.  Plan his arthroscopy with biceps tendon release open repair of the subscap and  possible fixation of the coracoid fracture depending on level of comminution through deltopectoral approach.  Would like to be able to try to repair all tendons through that approach.  Risks and benefits of surgery discussed with the patient including but limited to infection or vessel damage incomplete pain relief potential for more surgery with stiffness.  Patient understands the risks and benefits of surgical intervention wishes to proceed.  All questions  answered  Meredith Pel, MD 06/12/2015, 11:56 AM

## 2015-06-13 ENCOUNTER — Encounter (HOSPITAL_COMMUNITY)
Admission: RE | Disposition: A | Discharge status: Home / Self Care | Payer: Self-pay | Source: Ambulatory Visit | Attending: Orthopedic Surgery

## 2015-06-13 ENCOUNTER — Ambulatory Visit (HOSPITAL_COMMUNITY): Payer: BLUE CROSS/BLUE SHIELD | Admitting: Emergency Medicine

## 2015-06-13 ENCOUNTER — Encounter (HOSPITAL_COMMUNITY): Payer: Self-pay | Admitting: Critical Care Medicine

## 2015-06-13 ENCOUNTER — Observation Stay (HOSPITAL_COMMUNITY)
Admission: RE | Admit: 2015-06-13 | Discharge: 2015-06-14 | Disposition: A | Discharge status: Home / Self Care | Payer: BLUE CROSS/BLUE SHIELD | Source: Ambulatory Visit | Attending: Orthopedic Surgery | Admitting: Orthopedic Surgery

## 2015-06-13 ENCOUNTER — Ambulatory Visit (HOSPITAL_COMMUNITY): Payer: BLUE CROSS/BLUE SHIELD | Admitting: Critical Care Medicine

## 2015-06-13 DIAGNOSIS — I10 Essential (primary) hypertension: Secondary | ICD-10-CM | POA: Diagnosis not present

## 2015-06-13 DIAGNOSIS — Z87891 Personal history of nicotine dependence: Secondary | ICD-10-CM | POA: Diagnosis not present

## 2015-06-13 DIAGNOSIS — S43002A Unspecified subluxation of left shoulder joint, initial encounter: Secondary | ICD-10-CM | POA: Diagnosis not present

## 2015-06-13 DIAGNOSIS — M75100 Unspecified rotator cuff tear or rupture of unspecified shoulder, not specified as traumatic: Secondary | ICD-10-CM | POA: Diagnosis present

## 2015-06-13 DIAGNOSIS — S43005A Unspecified dislocation of left shoulder joint, initial encounter: Secondary | ICD-10-CM | POA: Diagnosis not present

## 2015-06-13 DIAGNOSIS — X58XXXA Exposure to other specified factors, initial encounter: Secondary | ICD-10-CM | POA: Insufficient documentation

## 2015-06-13 DIAGNOSIS — E039 Hypothyroidism, unspecified: Secondary | ICD-10-CM | POA: Insufficient documentation

## 2015-06-13 DIAGNOSIS — M75102 Unspecified rotator cuff tear or rupture of left shoulder, not specified as traumatic: Secondary | ICD-10-CM | POA: Diagnosis present

## 2015-06-13 DIAGNOSIS — K219 Gastro-esophageal reflux disease without esophagitis: Secondary | ICD-10-CM | POA: Insufficient documentation

## 2015-06-13 DIAGNOSIS — S42132A Displaced fracture of coracoid process, left shoulder, initial encounter for closed fracture: Secondary | ICD-10-CM | POA: Insufficient documentation

## 2015-06-13 HISTORY — PX: SHOULDER ARTHROSCOPY WITH OPEN ROTATOR CUFF REPAIR AND DISTAL CLAVICLE ACROMINECTOMY: SHX5683

## 2015-06-13 SURGERY — SHOULDER ARTHROSCOPY WITH OPEN ROTATOR CUFF REPAIR AND DISTAL CLAVICLE ACROMINECTOMY
Anesthesia: Regional | Site: Shoulder | Laterality: Left

## 2015-06-13 MED ORDER — LOSARTAN POTASSIUM 50 MG PO TABS
50.0000 mg | ORAL_TABLET | Freq: Every day | ORAL | Status: DC
  Administered 2015-06-14: 50 mg via ORAL
  Filled 2015-06-13: qty 1

## 2015-06-13 MED ORDER — DEXAMETHASONE SODIUM PHOSPHATE 10 MG/ML IJ SOLN
INTRAMUSCULAR | Status: DC | PRN
  Administered 2015-06-13: 10 mg via INTRAVENOUS

## 2015-06-13 MED ORDER — FENTANYL CITRATE (PF) 100 MCG/2ML IJ SOLN
INTRAMUSCULAR | Status: DC | PRN
  Administered 2015-06-13: 50 ug via INTRAVENOUS
  Administered 2015-06-13 (×2): 25 ug via INTRAVENOUS
  Administered 2015-06-13: 50 ug via INTRAVENOUS
  Administered 2015-06-13: 25 ug via INTRAVENOUS
  Administered 2015-06-13: 50 ug via INTRAVENOUS
  Administered 2015-06-13: 25 ug via INTRAVENOUS

## 2015-06-13 MED ORDER — ONDANSETRON HCL 4 MG/2ML IJ SOLN
INTRAMUSCULAR | Status: DC | PRN
  Administered 2015-06-13: 4 mg via INTRAVENOUS

## 2015-06-13 MED ORDER — ONDANSETRON HCL 4 MG PO TABS: 4.0000 mg | ORAL_TABLET | Freq: Four times a day (QID) | ORAL | Status: DC | PRN

## 2015-06-13 MED ORDER — METHOCARBAMOL 1000 MG/10ML IJ SOLN
500.0000 mg | Freq: Four times a day (QID) | INTRAVENOUS | Status: DC | PRN
  Filled 2015-06-13: qty 5

## 2015-06-13 MED ORDER — PANTOPRAZOLE SODIUM 40 MG PO TBEC
40.0000 mg | DELAYED_RELEASE_TABLET | Freq: Every day | ORAL | Status: DC
  Administered 2015-06-14: 40 mg via ORAL
  Filled 2015-06-13: qty 1

## 2015-06-13 MED ORDER — HYDROMORPHONE HCL 1 MG/ML IJ SOLN
1.0000 mg | INTRAMUSCULAR | Status: DC | PRN
  Administered 2015-06-14: 1 mg via INTRAVENOUS
  Filled 2015-06-13: qty 1

## 2015-06-13 MED ORDER — CEFAZOLIN SODIUM 1-5 GM-% IV SOLN
1.0000 g | Freq: Four times a day (QID) | INTRAVENOUS | Status: DC
  Filled 2015-06-13 (×2): qty 50

## 2015-06-13 MED ORDER — LACTATED RINGERS IV SOLN
INTRAVENOUS | Status: DC | PRN
  Administered 2015-06-13 (×2): via INTRAVENOUS

## 2015-06-13 MED ORDER — OXYCODONE HCL 5 MG PO TABS
ORAL_TABLET | ORAL | Status: AC
  Administered 2015-06-13: 10 mg via ORAL
  Filled 2015-06-13: qty 2

## 2015-06-13 MED ORDER — ACETAMINOPHEN 650 MG RE SUPP: 650.0000 mg | Freq: Four times a day (QID) | RECTAL | Status: DC | PRN

## 2015-06-13 MED ORDER — ROCURONIUM BROMIDE 50 MG/5ML IV SOLN
INTRAVENOUS | Status: AC
  Filled 2015-06-13: qty 2

## 2015-06-13 MED ORDER — DEXAMETHASONE SODIUM PHOSPHATE 10 MG/ML IJ SOLN
INTRAMUSCULAR | Status: AC
  Filled 2015-06-13: qty 1

## 2015-06-13 MED ORDER — MIDAZOLAM HCL 5 MG/5ML IJ SOLN
INTRAMUSCULAR | Status: DC | PRN
  Administered 2015-06-13 (×2): 1 mg via INTRAVENOUS

## 2015-06-13 MED ORDER — EPINEPHRINE HCL 1 MG/ML IJ SOLN
INTRAMUSCULAR | Status: AC
  Filled 2015-06-13: qty 1

## 2015-06-13 MED ORDER — ONDANSETRON HCL 4 MG/2ML IJ SOLN: 4.0000 mg | Freq: Once | INTRAMUSCULAR | Status: DC | PRN

## 2015-06-13 MED ORDER — ROCURONIUM BROMIDE 100 MG/10ML IV SOLN
INTRAVENOUS | Status: DC | PRN
  Administered 2015-06-13: 50 mg via INTRAVENOUS

## 2015-06-13 MED ORDER — DIVALPROEX SODIUM 500 MG PO DR TAB
500.0000 mg | DELAYED_RELEASE_TABLET | Freq: Two times a day (BID) | ORAL | Status: DC
  Administered 2015-06-13 – 2015-06-14 (×2): 500 mg via ORAL
  Filled 2015-06-13 (×2): qty 1

## 2015-06-13 MED ORDER — HYDROMORPHONE HCL 1 MG/ML IJ SOLN
INTRAMUSCULAR | Status: AC
  Administered 2015-06-13: 0.5 mg via INTRAVENOUS
  Filled 2015-06-13: qty 1

## 2015-06-13 MED ORDER — PHENYLEPHRINE HCL 10 MG/ML IJ SOLN
10.0000 mg | INTRAMUSCULAR | Status: DC | PRN
  Administered 2015-06-13: 25 ug/min via INTRAVENOUS

## 2015-06-13 MED ORDER — METOCLOPRAMIDE HCL 5 MG/ML IJ SOLN
5.0000 mg | Freq: Three times a day (TID) | INTRAMUSCULAR | Status: DC | PRN
Start: 2015-06-13 — End: 2015-06-14

## 2015-06-13 MED ORDER — FENTANYL CITRATE (PF) 250 MCG/5ML IJ SOLN
INTRAMUSCULAR | Status: AC
  Filled 2015-06-13: qty 5

## 2015-06-13 MED ORDER — METHOCARBAMOL 500 MG PO TABS
ORAL_TABLET | ORAL | Status: AC
  Administered 2015-06-13: 500 mg via ORAL
  Filled 2015-06-13: qty 1

## 2015-06-13 MED ORDER — ACETAMINOPHEN 325 MG PO TABS
ORAL_TABLET | ORAL | Status: AC
  Administered 2015-06-13: 650 mg via ORAL
  Filled 2015-06-13: qty 2

## 2015-06-13 MED ORDER — ACETAMINOPHEN 325 MG PO TABS
650.0000 mg | ORAL_TABLET | Freq: Four times a day (QID) | ORAL | Status: DC | PRN
Start: 2015-06-13 — End: 2015-06-14
  Administered 2015-06-13: 650 mg via ORAL

## 2015-06-13 MED ORDER — ASPIRIN EC 325 MG PO TBEC
325.0000 mg | DELAYED_RELEASE_TABLET | Freq: Every day | ORAL | Status: DC
  Administered 2015-06-13 – 2015-06-14 (×2): 325 mg via ORAL
  Filled 2015-06-13 (×2): qty 1

## 2015-06-13 MED ORDER — MIDAZOLAM HCL 2 MG/2ML IJ SOLN
INTRAMUSCULAR | Status: AC
  Filled 2015-06-13: qty 2

## 2015-06-13 MED ORDER — 0.9 % SODIUM CHLORIDE (POUR BTL) OPTIME
TOPICAL | Status: DC | PRN
  Administered 2015-06-13: 1000 mL

## 2015-06-13 MED ORDER — EPINEPHRINE HCL 1 MG/ML IJ SOLN
INTRAMUSCULAR | Status: DC | PRN
  Administered 2015-06-13: 1 mg via INTRAMUSCULAR

## 2015-06-13 MED ORDER — PROPOFOL 10 MG/ML IV BOLUS
INTRAVENOUS | Status: DC | PRN
  Administered 2015-06-13: 150 mg via INTRAVENOUS

## 2015-06-13 MED ORDER — CEFAZOLIN SODIUM-DEXTROSE 2-4 GM/100ML-% IV SOLN
2.0000 g | INTRAVENOUS | Status: AC
  Administered 2015-06-13: 2 g via INTRAVENOUS
  Filled 2015-06-13: qty 100

## 2015-06-13 MED ORDER — OXYCODONE HCL 5 MG PO TABS
5.0000 mg | ORAL_TABLET | ORAL | Status: DC | PRN
  Administered 2015-06-13 – 2015-06-14 (×7): 10 mg via ORAL
  Filled 2015-06-13 (×5): qty 2

## 2015-06-13 MED ORDER — POTASSIUM CHLORIDE IN NACL 20-0.9 MEQ/L-% IV SOLN
INTRAVENOUS | Status: DC
  Administered 2015-06-13: 75 mL via INTRAVENOUS
  Filled 2015-06-13: qty 1000

## 2015-06-13 MED ORDER — HYDROMORPHONE HCL 1 MG/ML IJ SOLN
0.2500 mg | INTRAMUSCULAR | Status: DC | PRN
  Administered 2015-06-13 (×4): 0.5 mg via INTRAVENOUS

## 2015-06-13 MED ORDER — SODIUM CHLORIDE 0.9 % IJ SOLN
INTRAMUSCULAR | Status: DC | PRN
  Administered 2015-06-13: 50 mL

## 2015-06-13 MED ORDER — ONDANSETRON HCL 4 MG/2ML IJ SOLN: 4.0000 mg | Freq: Four times a day (QID) | INTRAMUSCULAR | Status: DC | PRN

## 2015-06-13 MED ORDER — LIDOCAINE HCL (CARDIAC) 20 MG/ML IV SOLN
INTRAVENOUS | Status: DC | PRN
  Administered 2015-06-13: 100 mg via INTRAVENOUS

## 2015-06-13 MED ORDER — MEPERIDINE HCL 25 MG/ML IJ SOLN: 6.2500 mg | INTRAMUSCULAR | Status: DC | PRN

## 2015-06-13 MED ORDER — STERILE WATER FOR IRRIGATION IR SOLN
Status: DC | PRN
  Administered 2015-06-13: 1000 mL

## 2015-06-13 MED ORDER — LEVOTHYROXINE SODIUM 175 MCG PO TABS
175.0000 ug | ORAL_TABLET | Freq: Every day | ORAL | Status: DC
  Administered 2015-06-14: 175 ug via ORAL
  Filled 2015-06-13 (×2): qty 1

## 2015-06-13 MED ORDER — PROPOFOL 10 MG/ML IV BOLUS
INTRAVENOUS | Status: AC
  Filled 2015-06-13: qty 20

## 2015-06-13 MED ORDER — MENTHOL 3 MG MT LOZG: 1.0000 | LOZENGE | OROMUCOSAL | Status: DC | PRN

## 2015-06-13 MED ORDER — METHOCARBAMOL 500 MG PO TABS
500.0000 mg | ORAL_TABLET | Freq: Four times a day (QID) | ORAL | Status: DC | PRN
  Administered 2015-06-13 – 2015-06-14 (×3): 500 mg via ORAL
  Filled 2015-06-13 (×4): qty 1

## 2015-06-13 MED ORDER — LIDOCAINE 2% (20 MG/ML) 5 ML SYRINGE
INTRAMUSCULAR | Status: AC
  Filled 2015-06-13: qty 10

## 2015-06-13 MED ORDER — SODIUM CHLORIDE 0.9 % IR SOLN
Status: DC | PRN
  Administered 2015-06-13 (×2): 3000 mL

## 2015-06-13 MED ORDER — PHENOL 1.4 % MT LIQD: 1.0000 | OROMUCOSAL | Status: DC | PRN

## 2015-06-13 MED ORDER — CEFAZOLIN SODIUM 1-5 GM-% IV SOLN
1.0000 g | Freq: Four times a day (QID) | INTRAVENOUS | Status: AC
  Administered 2015-06-13 – 2015-06-14 (×2): 1 g via INTRAVENOUS
  Filled 2015-06-13 (×2): qty 50

## 2015-06-13 MED ORDER — METOCLOPRAMIDE HCL 5 MG PO TABS
5.0000 mg | ORAL_TABLET | Freq: Three times a day (TID) | ORAL | Status: DC | PRN
Start: 2015-06-13 — End: 2015-06-14

## 2015-06-13 MED ORDER — SUGAMMADEX SODIUM 200 MG/2ML IV SOLN
INTRAVENOUS | Status: DC | PRN
  Administered 2015-06-13: 150 mg via INTRAVENOUS

## 2015-06-13 SURGICAL SUPPLY — 77 items
AID PSTN UNV HD RSTRNT DISP (MISCELLANEOUS) ×1
ANCH SUT 2 FT CRKSCW 14.7 STRL (Anchor) ×2 IMPLANT
ANCH SUT PUSHLCK 24X4.5 STRL (Orthopedic Implant) ×3 IMPLANT
ANCHOR CORKSCREW BIO 5.5 FT (Anchor) ×2 IMPLANT
APL SKNCLS STERI-STRIP NONHPOA (GAUZE/BANDAGES/DRESSINGS) ×1
BENZOIN TINCTURE PRP APPL 2/3 (GAUZE/BANDAGES/DRESSINGS) ×1 IMPLANT
BLADE CUTTER GATOR 3.5 (BLADE) ×2 IMPLANT
BLADE GREAT WHITE 4.2 (BLADE) ×2 IMPLANT
BLADE SURG 11 STRL SS (BLADE) ×2 IMPLANT
BUR OVAL 6.0 (BURR) ×2 IMPLANT
COVER SURGICAL LIGHT HANDLE (MISCELLANEOUS) ×2 IMPLANT
DRAPE INCISE IOBAN 66X45 STRL (DRAPES) ×4 IMPLANT
DRAPE STERI 35X30 U-POUCH (DRAPES) ×2 IMPLANT
DRAPE U-SHAPE 47X51 STRL (DRAPES) ×4 IMPLANT
DRSG AQUACEL AG ADV 3.5X10 (GAUZE/BANDAGES/DRESSINGS) ×1 IMPLANT
DRSG MEPILEX BORDER 4X8 (GAUZE/BANDAGES/DRESSINGS) ×2 IMPLANT
DRSG PAD ABDOMINAL 8X10 ST (GAUZE/BANDAGES/DRESSINGS) ×6 IMPLANT
DRSG TEGADERM 2-3/8X2-3/4 SM (GAUZE/BANDAGES/DRESSINGS) ×1 IMPLANT
DURAPREP 26ML APPLICATOR (WOUND CARE) ×2 IMPLANT
ELECT REM PT RETURN 9FT ADLT (ELECTROSURGICAL) ×2
ELECTRODE REM PT RTRN 9FT ADLT (ELECTROSURGICAL) ×1 IMPLANT
FILTER STRAW FLUID ASPIR (MISCELLANEOUS) ×2 IMPLANT
GAUZE SPONGE 4X4 12PLY STRL (GAUZE/BANDAGES/DRESSINGS) ×1 IMPLANT
GAUZE XEROFORM 1X8 LF (GAUZE/BANDAGES/DRESSINGS) ×1 IMPLANT
GLOVE BIO SURGEON STRL SZ7 (GLOVE) ×1 IMPLANT
GLOVE BIOGEL PI IND STRL 6.5 (GLOVE) IMPLANT
GLOVE BIOGEL PI IND STRL 7.0 (GLOVE) IMPLANT
GLOVE BIOGEL PI IND STRL 8 (GLOVE) ×1 IMPLANT
GLOVE BIOGEL PI INDICATOR 6.5 (GLOVE) ×2
GLOVE BIOGEL PI INDICATOR 7.0 (GLOVE) ×1
GLOVE BIOGEL PI INDICATOR 8 (GLOVE) ×1
GLOVE ECLIPSE 7.0 STRL STRAW (GLOVE) ×1 IMPLANT
GLOVE SURG ORTHO 8.0 STRL STRW (GLOVE) ×2 IMPLANT
GOWN STRL REUS W/ TWL LRG LVL3 (GOWN DISPOSABLE) ×3 IMPLANT
GOWN STRL REUS W/TWL LRG LVL3 (GOWN DISPOSABLE) ×6
KIT BASIN OR (CUSTOM PROCEDURE TRAY) ×2 IMPLANT
KIT ROOM TURNOVER OR (KITS) ×2 IMPLANT
MANIFOLD NEPTUNE II (INSTRUMENTS) ×2 IMPLANT
NDL HYPO 25X1 1.5 SAFETY (NEEDLE) ×1 IMPLANT
NDL SCORPION MULTI FIRE (NEEDLE) IMPLANT
NDL SPNL 18GX3.5 QUINCKE PK (NEEDLE) ×1 IMPLANT
NDL SUT 6 .5 CRC .975X.05 MAYO (NEEDLE) ×1 IMPLANT
NEEDLE HYPO 25X1 1.5 SAFETY (NEEDLE) ×2 IMPLANT
NEEDLE MAYO TAPER (NEEDLE) ×2
NEEDLE SCORPION MULTI FIRE (NEEDLE) ×2 IMPLANT
NEEDLE SPNL 18GX3.5 QUINCKE PK (NEEDLE) ×2 IMPLANT
NS IRRIG 1000ML POUR BTL (IV SOLUTION) ×2 IMPLANT
PACK SHOULDER (CUSTOM PROCEDURE TRAY) ×2 IMPLANT
PAD ARMBOARD 7.5X6 YLW CONV (MISCELLANEOUS) ×4 IMPLANT
PUSHLOCK PEEK 4.5X24 (Orthopedic Implant) ×3 IMPLANT
RESTRAINT HEAD UNIVERSAL NS (MISCELLANEOUS) ×2 IMPLANT
SET ARTHROSCOPY TUBING (MISCELLANEOUS) ×2
SET ARTHROSCOPY TUBING LN (MISCELLANEOUS) ×1 IMPLANT
SLING ARM IMMOBILIZER MED (SOFTGOODS) IMPLANT
SPONGE LAP 4X18 X RAY DECT (DISPOSABLE) ×5 IMPLANT
STRIP CLOSURE SKIN 1/2X4 (GAUZE/BANDAGES/DRESSINGS) ×2 IMPLANT
SUCTION FRAZIER HANDLE 10FR (MISCELLANEOUS) ×1
SUCTION TUBE FRAZIER 10FR DISP (MISCELLANEOUS) ×1 IMPLANT
SUT ETHILON 3 0 PS 1 (SUTURE) ×3 IMPLANT
SUT FIBERWIRE #2 38 T-5 BLUE (SUTURE) ×6
SUT MNCRL AB 3-0 PS2 18 (SUTURE) ×2 IMPLANT
SUT VIC AB 0 CT1 27 (SUTURE) ×4
SUT VIC AB 0 CT1 27XBRD ANBCTR (SUTURE) ×1 IMPLANT
SUT VIC AB 1 CT1 27 (SUTURE) ×2
SUT VIC AB 1 CT1 27XBRD ANBCTR (SUTURE) ×1 IMPLANT
SUT VIC AB 2-0 CT1 27 (SUTURE) ×4
SUT VIC AB 2-0 CT1 TAPERPNT 27 (SUTURE) ×1 IMPLANT
SUT VICRYL 0 UR6 27IN ABS (SUTURE) ×2 IMPLANT
SUTURE FIBERWR #2 38 T-5 BLUE (SUTURE) IMPLANT
SYR 20CC LL (SYRINGE) ×4 IMPLANT
SYR 3ML LL SCALE MARK (SYRINGE) ×2 IMPLANT
SYR TB 1ML LUER SLIP (SYRINGE) ×1 IMPLANT
SYSTEM TENDODESIS IMPLANT (Miscellaneous) ×1 IMPLANT
TOWEL OR 17X24 6PK STRL BLUE (TOWEL DISPOSABLE) ×2 IMPLANT
TOWEL OR 17X26 10 PK STRL BLUE (TOWEL DISPOSABLE) ×2 IMPLANT
WAND HAND CNTRL MULTIVAC 90 (MISCELLANEOUS) ×1 IMPLANT
WATER STERILE IRR 1000ML POUR (IV SOLUTION) ×2 IMPLANT

## 2015-06-13 NOTE — Anesthesia Postprocedure Evaluation (Signed)
Anesthesia Post Note  Patient: Emma Glenn  Procedure(s) Performed: Procedure(s) (LRB): LEFT SHOULDER ARTHROSCOPY, DEBRIDEMENT, OPEN ROTATOR CUFF REPAIR, CORACOID FRACTURE FIXATION, BICEPS TENODESIS (Left)  Patient location during evaluation: PACU Anesthesia Type: General Level of consciousness: awake and alert Pain management: pain level controlled Vital Signs Assessment: post-procedure vital signs reviewed and stable Respiratory status: spontaneous breathing, nonlabored ventilation and respiratory function stable Cardiovascular status: blood pressure returned to baseline and stable Postop Assessment: no signs of nausea or vomiting Anesthetic complications: no    Last Vitals:  Filed Vitals:   06/13/15 1700 06/13/15 2000  BP: 114/81 111/76  Pulse: 99 95  Temp: 37.1 C 36.5 C  Resp: 16 16    Last Pain:  Filed Vitals:   06/13/15 2004  PainSc: 6                  Zayna Toste A

## 2015-06-13 NOTE — Anesthesia Procedure Notes (Addendum)
Anesthesia Regional Block:  Interscalene brachial plexus block  Pre-Anesthetic Checklist: ,, timeout performed, Correct Patient, Correct Site, Correct Laterality, Correct Procedure, Correct Position, site marked, Risks and benefits discussed,  Surgical consent,  Pre-op evaluation,  At surgeon's request and post-op pain management  Laterality: Left  Prep: chloraprep       Needles:  Injection technique: Single-shot  Needle Type: Echogenic Stimulator Needle     Needle Length: 5cm 5 cm Needle Gauge: 21 and 21 G    Additional Needles:  Procedures: ultrasound guided (picture in chart) and nerve stimulator Interscalene brachial plexus block  Nerve Stimulator or Paresthesia:  Response: 0.4 mA,   Additional Responses:   Narrative:  Start time: 06/13/2015 7:10 AM End time: 06/13/2015 7:20 AM Injection made incrementally with aspirations every 5 mL.  Performed by: Personally  Anesthesiologist: Lillia Abed  Additional Notes: Monitors applied. Patient sedated. Sterile prep and drape,hand hygiene and sterile gloves were used. Relevant anatomy identified.Needle position confirmed.Local anesthetic injected incrementally after negative aspiration. Local anesthetic spread visualized around nerve(s). Vascular puncture avoided. No complications. Image printed for medical record.The patient tolerated the procedure well.        Procedure Name: Intubation Date/Time: 06/13/2015 7:38 AM Performed by: Merrilyn Puma B Pre-anesthesia Checklist: Patient identified, Emergency Drugs available, Suction available, Patient being monitored and Timeout performed Patient Re-evaluated:Patient Re-evaluated prior to inductionOxygen Delivery Method: Circle system utilized Preoxygenation: Pre-oxygenation with 100% oxygen Intubation Type: IV induction Ventilation: Mask ventilation without difficulty Laryngoscope Size: Mac and 3 Grade View: Grade I Tube type: Oral Tube size: 7.0 mm Number of attempts: 1 Airway  Equipment and Method: Stylet Placement Confirmation: breath sounds checked- equal and bilateral,  CO2 detector,  positive ETCO2 and ETT inserted through vocal cords under direct vision Secured at: 21 cm Tube secured with: Tape Dental Injury: Teeth and Oropharynx as per pre-operative assessment

## 2015-06-13 NOTE — Op Note (Signed)
Emma Glenn, Emma Glenn NO.:  [id number]  MEDICAL RECORD NO.:  [id number]  LOCATION:  [id number]                        FACILITY:  [facility]  PHYSICIAN:  Anderson Malta, M.D.    DATE OF BIRTH:  1965/02/18  DATE OF PROCEDURE: DATE OF DISCHARGE:                              OPERATIVE REPORT   PREOPERATIVE DIAGNOSIS:  Left shoulder massive rotator cuff tear with biceps tendon subluxation and coracoid fracture.  POSTOPERATIVE DIAGNOSIS:  Left shoulder massive rotator cuff tear with biceps tendon subluxation and coracoid fracture.  PROCEDURES:  Left shoulder arthroscopy, limited debridement of the labrum with release of the biceps tendon, open suprapectoral biceps tenodesis using EndoButton technique with repair of infraspinatus and supraspinatus using corkscrews, PushLocks from [employer].  SURGEON:  Anderson Malta, M.D.  ASSISTANT:  Laure Kidney, RNFA.  INDICATIONS:  Emma Glenn is a 50 year old patient with 7 weeks out left shoulder dislocation.  She has rotator cuff tear, which is massive.  She presents now for operative management after explanation of risks and benefits.  PROCEDURE IN DETAIL:  The patient was brought to the operating room where general anesthetic was induced.  Preoperative antibiotics were administered.  Time-out was called.  Left shoulder was examined under anesthesia and she was found to have somewhat of an early frozen shoulder.  This was manipulated in the full forward flexion and abduction and external rotation to about 60 degrees.  The shoulder itself was stable.  Following this manipulation, the patient was placed in the beach-chair position and head in neutral position.  Left arm, shoulder, hand prescrubbed with alcohol and Betadine, allowed to air dry, prepped with DuraPrep solution and draped in a sterile manner. Charlie Pitter was used to cover the axilla and most of the operative field.  It covered the entire operative field for the open part of the  procedure. Posterior portal was created 2 cm medial and inferior to the posterolateral margin of the acromion.  Diagnostic arthroscopy was performed.  The anterior portal created under direct visualization. Biceps tendon was indeed subluxated.  The rotator cuff tears appeared chronic and retracted.  Debridement and biceps tendon release were performed.  Anterior portal created under direct visualization to facilitate this.  Following this, the instruments were removed, portals were closed using 3-0 nylon.  Charlie Pitter was used to cover the entire operative field.  Deltopectoral approach was made.  Cephalic vein had multiple branches and was ligated.  Significant adhesions had formed between the rotator cuff, humeral head and the underlying deltoid, these were freed manually.  CA ligament was preserved.  Subscapularis was attached, bottom 50%.  The upper portion of the subscap was retracted beyond the glenoid rim.  Attempt was made to mobilize this; however, it was not achievable.  At this time, coracoid fracture had fibrous union and did not require fixation.  The rotator cuff was then attempted to be mobilized.  Cobb retractor placed on top of the glenoid between the capsule and the rotator cuff and then on top of the rotator cuff in the bursal space to facilitate immobilization, stay sutures placed. Gradually, the tendon was able to be mobilized so that it covered about half of the  footprint.  At this time, biceps tendon was tenodesed suprapectoral using EndoButton technique.  This was sutured in the appropriate tension.  Following this, rotator cuff was repaired by placing with assistance of scorpion suture passer for #2 FiberWire sutures in the end of the tendon and then two corkscrews were placed with penetration of the tendon more medial to the original suture placement.  All of the sutures were then placed in the three PushLocks with good repair achieved.  At this time, thorough  irrigation was performed.  Rotator interval closed with the arm in 45 degrees of external rotation.  This was done to prevent anterior-superior instability with the absent superior half of the subscap.  Deltopectoral interval was then closed using 0 Vicryl suture followed by interrupted inverted 2-0 Vicryl suture and running 3-0 Monocryl, impervious wrap was placed.  The patient tolerated the procedure well without immediate complication.  Abduction pillow was placed.  The patient tolerated the procedure well without immediate complications, transferred to the recovery room in stable condition for 23-hour observation.     Anderson Malta, M.D.     GSD/MEDQ  D:  06/13/2015  T:  06/13/2015  Job:  [id number]

## 2015-06-13 NOTE — Brief Op Note (Signed)
06/13/2015  10:41 AM  PATIENT:  Emma Glenn  50 y.o. female  PRE-OPERATIVE DIAGNOSIS:  Left shoulder rotator cuff tear, biceps subluxation, coracoid fracture  POST-OPERATIVE DIAGNOSIS:  Left shoulder rotator cuff tear, biceps subluxation, coracoid fracture  PROCEDURE:  Procedure(s): LEFT SHOULDER ARTHROSCOPY, DEBRIDEMENT, OPEN ROTATOR CUFF REPAIR, CORACOID FRACTURE FIXATION, BICEPS TENODESIS  SURGEON:  Surgeon(s): Meredith Pel, MD  ASSISTANT: Laure Kidney RNFA  ANESTHESIA:   general  EBL: 50 ml    Total I/O In: 1300 [I.V.:1300] Out: 1250 [Urine:1150; Blood:100]  BLOOD ADMINISTERED: none  DRAINS: none   LOCAL MEDICATIONS USED:  none  SPECIMEN:  No Specimen  COUNTS:  YES  TOURNIQUET:  * No tourniquets in log *  DICTATION: .Other Dictation: Dictation Number [id number]  PLAN OF CARE: Admit for overnight observation  PATIENT DISPOSITION:  PACU - hemodynamically stable

## 2015-06-13 NOTE — Anesthesia Postprocedure Evaluation (Signed)
Anesthesia Post Note  Patient: Emma Glenn  Procedure(s) Performed: Procedure(s) (LRB): LEFT SHOULDER ARTHROSCOPY, DEBRIDEMENT, OPEN ROTATOR CUFF REPAIR, CORACOID FRACTURE FIXATION, BICEPS TENODESIS (Left)  Patient location during evaluation: PACU Anesthesia Type: General Level of consciousness: awake and alert Pain management: pain level controlled Vital Signs Assessment: post-procedure vital signs reviewed and stable Respiratory status: spontaneous breathing, nonlabored ventilation, respiratory function stable and patient connected to nasal cannula oxygen Cardiovascular status: blood pressure returned to baseline and stable Postop Assessment: no signs of nausea or vomiting Anesthetic complications: no    Last Vitals:  Filed Vitals:   06/13/15 1700 06/13/15 2000  BP: 114/81 111/76  Pulse: 99 95  Temp: 37.1 C 36.5 C  Resp: 16 16    Last Pain:  Filed Vitals:   06/13/15 2004  PainSc: 6                  Esco Joslyn DAVID

## 2015-06-13 NOTE — Progress Notes (Signed)
Orthopedic Tech Progress Note Patient Details:  Emma Glenn 11-11-1965 [id number]  Ortho Devices Type of Ortho Device: Abduction pillow Ortho Device/Splint Interventions: Application   [name] 06/13/2015, 10:04 AM

## 2015-06-13 NOTE — Interval H&P Note (Signed)
History and Physical Interval Note:  06/13/2015 7:23 AM  Emma Glenn  has presented today for surgery, with the diagnosis of Left shoulder rotator cuff tear, biceps subluxation, coracoid fracture  The various methods of treatment have been discussed with the patient and family. After consideration of risks, benefits and other options for treatment, the patient has consented to  Procedure(s): LEFT SHOULDER ARTHROSCOPY, DEBRIDEMENT, OPEN ROTATOR CUFF REPAIR, POSSIBLE CORACOID FRACTURE FIXATION, BICEPS TENODESIS (Left) as a surgical intervention .  The patient's history has been reviewed, patient examined, no change in status, stable for surgery.  I have reviewed the patient's chart and labs.  Questions were answered to the patient's satisfaction.     DEAN,GREGORY SCOTT

## 2015-06-13 NOTE — Anesthesia Preprocedure Evaluation (Addendum)
Anesthesia Evaluation  Patient identified by MRN, date of birth, ID band Patient awake    Reviewed: Allergy & Precautions, NPO status , Patient's Chart, lab work & pertinent test results  Airway Mallampati: II  TM Distance: >3 FB Neck ROM: Full    Dental  (+) Dental Advisory Given, Teeth Intact   Pulmonary former smoker,    Pulmonary exam normal        Cardiovascular hypertension, Pt. on medications Normal cardiovascular exam     Neuro/Psych Seizures -,     GI/Hepatic GERD  Medicated and Controlled,  Endo/Other  Hypothyroidism   Renal/GU      Musculoskeletal   Abdominal   Peds  Hematology   Anesthesia Other Findings   Reproductive/Obstetrics                           Anesthesia Physical Anesthesia Plan  ASA: II  Anesthesia Plan: General and Regional   Post-op Pain Management: GA combined w/ Regional for post-op pain   Induction: Intravenous  Airway Management Planned: Oral ETT  Additional Equipment:   Intra-op Plan:   Post-operative Plan: Extubation in OR  Informed Consent: I have reviewed the patients History and Physical, chart, labs and discussed the procedure including the risks, benefits and alternatives for the proposed anesthesia with the patient or authorized representative who has indicated his/her understanding and acceptance.   Dental advisory given  Plan Discussed with: Surgeon and CRNA  Anesthesia Plan Comments:        Anesthesia Quick Evaluation

## 2015-06-13 NOTE — Progress Notes (Signed)
Pt sitting up in bed. Regular diet tray ordered. Pt tol well.

## 2015-06-13 NOTE — Transfer of Care (Signed)
Immediate Anesthesia Transfer of Care Note  Patient: VINITA PRENTISS  Procedure(s) Performed: Procedure(s): LEFT SHOULDER ARTHROSCOPY, DEBRIDEMENT, OPEN ROTATOR CUFF REPAIR, CORACOID FRACTURE FIXATION, BICEPS TENODESIS (Left)  Patient Location: PACU  Anesthesia Type:GA combined with regional for post-op pain  Level of Consciousness: awake, alert  and oriented  Airway & Oxygen Therapy: Patient Spontanous Breathing and Patient connected to nasal cannula oxygen  Post-op Assessment: Report given to RN, Post -op Vital signs reviewed and stable and Patient moving all extremities X 4  Post vital signs: Reviewed and stable  Last Vitals:  Filed Vitals:   06/13/15 0616  BP: 139/90  Pulse: 87  Temp: 36.9 C  Resp: 18    Last Pain:  Filed Vitals:   06/13/15 0618  PainSc: 3          Complications: No apparent anesthesia complications

## 2015-06-14 DIAGNOSIS — M75102 Unspecified rotator cuff tear or rupture of left shoulder, not specified as traumatic: Secondary | ICD-10-CM | POA: Diagnosis not present

## 2015-06-14 MED ORDER — METHOCARBAMOL 500 MG PO TABS: 500.0000 mg | ORAL_TABLET | Freq: Four times a day (QID) | ORAL | Status: DC | PRN

## 2015-06-14 MED ORDER — HYDROCODONE-ACETAMINOPHEN 5-325 MG PO TABS: 1.0000 | ORAL_TABLET | ORAL | Status: DC | PRN

## 2015-06-14 NOTE — Progress Notes (Signed)
Occupational Therapy Treatment/Discharge Patient Details Name: Emma Glenn MRN: [id number] DOB: Apr 30, 1965 Today's Date: 06/14/2015    History of present illness 50 y.o. female s/p L shoulder arthroscopy, open rotator cuff repair, coracoid fx fixation and biceps tenodesis. PMH significant for CIN I (cervical intraepithelial neoplasia), HTN, thyroid disease/hypothyroidism, GERD, and seizures.     OT comments  Pt progressing well and has achieved all acute OT goals. Reviewed shoulder precautions and proper LUE positioning with pt and pt's husband. Practiced UB dressing and bathing tasks and completed LUE HEP. Educated pt on pain/edema management, fall prevention, and home safety strategies. All education has been completed and pt/husband have no further questions. Pt with no further acute OT needs. OT signing off.    Follow Up Recommendations  Other (comment) (Progress shoulder rehab at MD discretion)    Equipment Recommendations  None recommended by OT    Recommendations for Other Services      Precautions / Restrictions Precautions Precautions: Shoulder Type of Shoulder Precautions: No shoulder A/PROM, AROM hand, wrist, elbow allowed Shoulder Interventions: Shoulder sling/immobilizer;At all times Precaution Booklet Issued: Yes (comment) Precaution Comments: Reviewed all precautions and handout with pt and husband Required Braces or Orthoses: Sling Restrictions Weight Bearing Restrictions: Yes LUE Weight Bearing: Non weight bearing       Mobility Bed Mobility Overal bed mobility: Needs Assistance Bed Mobility: Supine to Sit     Supine to sit: Min guard;HOB elevated     General bed mobility comments: Pt up in chair on OT arrival  Transfers Overall transfer level: Needs assistance Equipment used: None;1 person hand held assist Transfers: Sit to/from Stand Sit to Stand: Min assist;Min guard         General transfer comment: Husband provided hand held assist for  boost to stand x2. No physical assist required once pt in standing position.    Balance Overall balance assessment: Needs assistance Sitting-balance support: No upper extremity supported;Feet supported Sitting balance-Leahy Scale: Good     Standing balance support: No upper extremity supported;During functional activity Standing balance-Leahy Scale: Good                     ADL Overall ADL's : Needs assistance/impaired     Grooming: Wash/dry hands;Set up;Standing   Upper Body Bathing: Moderate assistance;Sitting;Cueing for safety;Cueing for compensatory techniques;Cueing for UE precautions Upper Body Bathing Details (indicate cue type and reason): Cues for technique for pt's husband Lower Body Bathing: Minimal assistance;Sit to/from stand;Cueing for safety   Upper Body Dressing : Moderate assistance;Standing;Cueing for safety;Cueing for compensatory techniques;Cueing for UE precautions Upper Body Dressing Details (indicate cue type and reason): Cues for safety and technique for pt's husband assisting Lower Body Dressing: Moderate assistance;Sit to/from stand   Toilet Transfer: Supervision/safety;Ambulation;Regular Museum/gallery exhibitions officer and Hygiene: Sit to/from stand;Supervision/safety   Tub/ Shower Transfer: Walk-in shower;Supervision/safety;Cueing for safety;Ambulation Tub/Shower Transfer Details (indicate cue type and reason): Advised pt's husband to supervise transfer for first couple of weeks Functional mobility during ADLs: Supervision/safety General ADL Comments: Reviewed all precautions and practiced compensatory strategies for UB dressing and bathing with pt's husband assisting. Completed LUE HEP. Educated pt/husband on pain/edema management, fall prevention, and home safety strategies.      Vision                     Perception     Praxis      Cognition   Behavior During Therapy: [facility] for tasks assessed/performed Overall  Cognitive  Status: Within Functional Limits for tasks assessed                       Extremity/Trunk Assessment  Upper Extremity Assessment Upper Extremity Assessment: LUE deficits/detail LUE Deficits / Details: decreased ROM and strength as expected post op LUE: Unable to fully assess due to immobilization;Unable to fully assess due to pain LUE Coordination: decreased gross motor   Lower Extremity Assessment Lower Extremity Assessment: Overall WFL for tasks assessed   Cervical / Trunk Assessment Cervical / Trunk Assessment: Normal    Exercises Shoulder Exercises Elbow Flexion: AROM;10 reps;Left;Seated;Supine Elbow Extension: AROM;Left;10 reps;Supine;Seated Wrist Flexion: AROM;Left;10 reps;Supine;Seated Wrist Extension: AROM;Left;10 reps;Supine;Seated Digit Composite Flexion: AROM;Left;10 reps;Supine;Seated Composite Extension: AROM;Left;10 reps;Seated;Supine Donning/doffing shirt without moving shoulder: Moderate assistance Method for sponge bathing under operated UE: Moderate assistance Donning/doffing sling/immobilizer: Moderate assistance Correct positioning of sling/immobilizer: Minimal assistance Sling wearing schedule (on at all times/off for ADL's): Independent Proper positioning of operated UE when showering: Supervision/safety Positioning of UE while sleeping: Supervision/safety   Shoulder Instructions Shoulder Instructions Donning/doffing shirt without moving shoulder: Moderate assistance Method for sponge bathing under operated UE: Moderate assistance Donning/doffing sling/immobilizer: Moderate assistance Correct positioning of sling/immobilizer: Minimal assistance Sling wearing schedule (on at all times/off for ADL's): Independent Proper positioning of operated UE when showering: Supervision/safety Positioning of UE while sleeping: Supervision/safety     General Comments      Pertinent Vitals/ Pain       Pain Assessment: Faces Pain Score: 7  Faces  Pain Scale: Hurts even more Pain Location: L shoulder Pain Descriptors / Indicators: Aching;Burning Pain Intervention(s): Limited activity within patient's tolerance;Monitored during session;Premedicated before session;Repositioned  Home Living Family/patient expects to be discharged to:: Private residence Living Arrangements: Spouse/significant other Available Help at Discharge: Family;Available 24 hours/day (husband and sister-in-law) Type of Home: Mobile home Home Access: Stairs to enter Entrance Stairs-Number of Steps: 4 Entrance Stairs-Rails: Right;Left Home Layout: One level     Bathroom Shower/Tub: Occupational psychologist: Standard     Home Equipment: Bedside commode          Prior Functioning/Environment Level of Independence: Independent            Frequency Min 2X/week     Progress Toward Goals  OT Goals(current goals can now be found in the care plan section)  Progress towards OT goals: Goals met/education completed, patient discharged from OT  Acute Rehab OT Goals Patient Stated Goal: to go home today OT Goal Formulation: With patient Time For Goal Achievement: 06/28/15 Potential to Achieve Goals: Good ADL Goals Pt Will Perform Upper Body Bathing: with min assist;with caregiver independent in assisting;sitting Pt Will Perform Upper Body Dressing: with mod assist;with caregiver independent in assisting;sitting (including donning/doffing sling) Pt Will Perform Tub/Shower Transfer: Shower transfer;with min guard assist;with caregiver independent in assisting;ambulating;3 in 1 Pt/caregiver will Perform Home Exercise Program: Increased ROM;Left upper extremity;With minimal assist;With written HEP provided (assist to don/doff sling)  Plan All goals met and education completed, patient discharged from OT services    Co-evaluation                 End of Session Equipment Utilized During Treatment: Other (comment) (sling)   Activity  Tolerance Patient limited by pain   Patient Left in chair;with call bell/phone within reach;with family/visitor present   Nurse Communication Mobility status    Functional Assessment Tool Used: clinical judgement Functional Limitation: Self care Self Care Current Status (Q6834): At least 20 percent  but less than 40 percent impaired, limited or restricted Self Care Goal Status (B0488): At least 20 percent but less than 40 percent impaired, limited or restricted Self Care Discharge Status [phone]): At least 20 percent but less than 40 percent impaired, limited or restricted   Time: 4503-8882 OT Time Calculation (min): 18 min  Charges: OT G-codes **NOT FOR INPATIENT CLASS** Functional Assessment Tool Used: clinical judgement Functional Limitation: Self care Self Care Current Status (C0034): At least 20 percent but less than 40 percent impaired, limited or restricted Self Care Goal Status (J1791): At least 20 percent but less than 40 percent impaired, limited or restricted Self Care Discharge Status [phone]): At least 20 percent but less than 40 percent impaired, limited or restricted OT General Charges $OT Visit: 1 Procedure OT Evaluation $OT Eval Moderate Complexity: 1 Procedure OT Treatments $Self Care/Home Management : 8-22 mins  Redmond Baseman, OTR/L Pager: [phone] 06/14/2015, 10:56 AM

## 2015-06-14 NOTE — Progress Notes (Signed)
Occupational Therapy Evaluation Patient Details Name: Emma Glenn MRN: [id number] DOB: 10/22/1965 Today's Date: 06/14/2015    History of Present Illness 50 y.o. female s/p L shoulder arthroscopy, open rotator cuff repair, coracoid fx fixation and biceps tenodesis. PMH significant for CIN I (cervical intraepithelial neoplasia), HTN, thyroid disease/hypothyroidism, GERD, and seizures.     Clinical Impression   PTA, pt was independent with ADLs and mobility. Pt currently requires mod assist for UB ADLs and min guard assist for ambulation due to increased dizziness upon standing. Educated pt on NWB status of LLE, positioning LUE at rest, and compensatory strategies for bathing and dressing. Pt plans to d/c home with 24/7 assistance from her husband and sister-in-law. Pt will benefit from continued acute OT to increase independence and safety with ADLs and mobility to allow for safe discharge home.     Follow Up Recommendations  Other (comment) (Progress shoulder rehab at MD discretion)    Equipment Recommendations  None recommended by OT    Recommendations for Other Services       Precautions / Restrictions Precautions Precautions: Shoulder Type of Shoulder Precautions: No shoulder A/PROM, AROM hand, wrist, elbow allowed Shoulder Interventions: Shoulder sling/immobilizer;At all times Precaution Booklet Issued: Yes (comment) Required Braces or Orthoses: Sling Restrictions Weight Bearing Restrictions: Yes LUE Weight Bearing: Non weight bearing      Mobility Bed Mobility Overal bed mobility: Needs Assistance Bed Mobility: Supine to Sit     Supine to sit: Min guard;HOB elevated     General bed mobility comments: HOB elevated, exited on R side. Pt reports she will probably be sleeping in recliner or on couch at home. Educated on placement of pillows for best positioning of LUE.  Transfers Overall transfer level: Needs assistance Equipment used: None;1 person hand held  assist Transfers: Sit to/from Stand Sit to Stand: Min assist;Min guard         General transfer comment: Min guard-min assist for boost to stand. Pt reported feeling "woozy" upon standing and requested to use IV pole while ambulating for support. No overt LOB  - and no physical assist required once standing.    Balance Overall balance assessment: Needs assistance Sitting-balance support: No upper extremity supported;Feet supported Sitting balance-Leahy Scale: Good     Standing balance support: Single extremity supported;During functional activity Standing balance-Leahy Scale: Good                              ADL Overall ADL's : Needs assistance/impaired     Grooming: Wash/dry hands;Set up;Standing           Upper Body Dressing : Moderate assistance;Sitting;Cueing for compensatory techniques Upper Body Dressing Details (indicate cue type and reason): educated to dress LUE first and undress is last Lower Body Dressing: Moderate assistance;Sit to/from stand   Toilet Transfer: Supervision/safety;Ambulation;Comfort height toilet   Toileting- Clothing Manipulation and Hygiene: Supervision/safety;Sit to/from stand       Functional mobility during ADLs: Supervision/safety (used IV pole while ambulating due to feeling "woozy") General ADL Comments: Reviewed compensatory strategies for bathing and dressing and pt verbalized understanding. Will practice these tasks during second session today. No family present for OT eval.     Vision Vision Assessment?: No apparent visual deficits   Perception     Praxis      Pertinent Vitals/Pain Pain Assessment: 0-10 Pain Score: 7  Pain Location: L shoulder Pain Descriptors / Indicators: Aching;Sore Pain Intervention(s): Limited activity within patient's  tolerance;Monitored during session;Premedicated before session;Repositioned     Hand Dominance Right   Extremity/Trunk Assessment Upper Extremity Assessment Upper  Extremity Assessment: LUE deficits/detail LUE Deficits / Details: decreased ROM and strength as expected post op LUE: Unable to fully assess due to immobilization;Unable to fully assess due to pain LUE Coordination: decreased gross motor   Lower Extremity Assessment Lower Extremity Assessment: Overall WFL for tasks assessed   Cervical / Trunk Assessment Cervical / Trunk Assessment: Normal   Communication Communication Communication: No difficulties   Cognition Arousal/Alertness: Awake/alert Behavior During Therapy: WFL for tasks assessed/performed Overall Cognitive Status: Within Functional Limits for tasks assessed                     General Comments       Exercises Exercises: Shoulder     Shoulder Instructions Shoulder Instructions Donning/doffing shirt without moving shoulder: Moderate assistance Method for sponge bathing under operated UE: Moderate assistance Donning/doffing sling/immobilizer: Moderate assistance Correct positioning of sling/immobilizer: Moderate assistance Sling wearing schedule (on at all times/off for ADL's): Independent Proper positioning of operated UE when showering: Moderate assistance Positioning of UE while sleeping: Moderate assistance    Home Living Family/patient expects to be discharged to:: Private residence Living Arrangements: Spouse/significant other Available Help at Discharge: Family;Available 24 hours/day (husband and sister-in-law) Type of Home: Mobile home Home Access: Stairs to enter Entrance Stairs-Number of Steps: 4 Entrance Stairs-Rails: Right;Left Home Layout: One level     Bathroom Shower/Tub: Occupational psychologist: Standard     Home Equipment: Bedside commode          Prior Functioning/Environment Level of Independence: Independent             OT Diagnosis: Acute pain   OT Problem List: Decreased strength;Decreased range of motion;Decreased activity tolerance;Impaired balance  (sitting and/or standing);Decreased safety awareness;Decreased knowledge of use of DME or AE;Decreased knowledge of precautions;Pain   OT Treatment/Interventions: Self-care/ADL training;Therapeutic exercise;Energy conservation;DME and/or AE instruction;Therapeutic activities;Patient/family education;Balance training    OT Goals(Current goals can be found in the care plan section) Acute Rehab OT Goals Patient Stated Goal: to go home today OT Goal Formulation: With patient Time For Goal Achievement: 06/28/15 Potential to Achieve Goals: Good ADL Goals Pt Will Perform Upper Body Bathing: with min assist;with caregiver independent in assisting;sitting Pt Will Perform Upper Body Dressing: with mod assist;with caregiver independent in assisting;sitting (including donning/doffing sling) Pt Will Perform Tub/Shower Transfer: Shower transfer;with min guard assist;with caregiver independent in assisting;ambulating;3 in 1 Pt/caregiver will Perform Home Exercise Program: Increased ROM;Left upper extremity;With minimal assist;With written HEP provided (assist to don/doff sling)  OT Frequency: Min 2X/week   Barriers to D/C:            Co-evaluation              End of Session Equipment Utilized During Treatment: Gait belt;Other (comment) (sling) Nurse Communication: Mobility status;Other (comment) (Pt will require 2nd session)  Activity Tolerance: Patient tolerated treatment well Patient left: in chair;with call bell/phone within reach   Time: 1610-9604 OT Time Calculation (min): 26 min Charges:  OT General Charges $OT Visit: 1 Procedure OT Evaluation $OT Eval Moderate Complexity: 1 Procedure OT Treatments $Self Care/Home Management : 8-22 mins G-Codes: OT G-codes **NOT FOR INPATIENT CLASS** Functional Assessment Tool Used: clinical judgement Functional Limitation: Self care Self Care Current Status (V4098): At least 20 percent but less than 40 percent impaired, limited or  restricted Self Care Goal Status (J1914): At least 1 percent  but less than 20 percent impaired, limited or restricted  Redmond Baseman, OTR/L Pager: [phone] 06/14/2015, 8:50 AM

## 2015-06-14 NOTE — Discharge Summary (Signed)
Physician Discharge Summary  Patient ID: Emma Glenn MRN: [id number] DOB/AGE: August 01, 1965 50 y.o.  Admit date: 06/13/2015 Discharge date: 06/14/2015  Admission Diagnoses:  Active Problems:   Rotator cuff tear   Discharge Diagnoses:  Same  Surgeries: Procedure(s): LEFT SHOULDER ARTHROSCOPY, DEBRIDEMENT, OPEN ROTATOR CUFF REPAIR, CORACOID FRACTURE FIXATION, BICEPS TENODESIS on 06/13/2015   Consultants:    Discharged Condition: Stable  Hospital Course: Emma Glenn is an 50 y.o. female who was admitted 06/13/2015 with a chief complaint of shoulder pain, and found to have a diagnosis of rotator cuff tear - massive.  They were brought to the operating room on 06/13/2015 and underwent the above named procedures. Pain control ok at dc - home with CPM and will f/u one week   Antibiotics given:  Anti-infectives    Start     Dose/Rate Route Frequency Ordered Stop   06/13/15 1800  ceFAZolin (ANCEF) IVPB 1 g/50 mL premix     1 g 100 mL/hr over 30 Minutes Intravenous Every 6 hours 06/13/15 1747 06/14/15 0046   06/13/15 1415  ceFAZolin (ANCEF) IVPB 1 g/50 mL premix  Status:  Discontinued     1 g 100 mL/hr over 30 Minutes Intravenous Every 6 hours 06/13/15 1405 06/13/15 1747   06/13/15 0610  ceFAZolin (ANCEF) IVPB 2g/100 mL premix     2 g 200 mL/hr over 30 Minutes Intravenous On call to O.R. 06/13/15 5621 06/13/15 0745    .  Recent vital signs:  Filed Vitals:   06/14/15 0032 06/14/15 0620  BP: 115/79 115/65  Pulse: 89 93  Temp: 97.5 F (36.4 C) 97.2 F (36.2 C)  Resp: 17 17    Recent laboratory studies:  Results for orders placed or performed during the hospital encounter of 06/08/15  CBC  Result Value Ref Range   WBC 8.4 4.0 - 10.5 K/uL   RBC 4.51 3.87 - 5.11 MIL/uL   Hemoglobin 14.2 12.0 - 15.0 g/dL   HCT 42.2 36.0 - 46.0 %   MCV 93.6 78.0 - 100.0 fL   MCH 31.5 26.0 - 34.0 pg   MCHC 33.6 30.0 - 36.0 g/dL   RDW 13.4 11.5 - 15.5 %   Platelets 210 150 - 400 K/uL  Basic  metabolic panel  Result Value Ref Range   Sodium 142 135 - 145 mmol/L   Potassium 4.4 3.5 - 5.1 mmol/L   Chloride 106 101 - 111 mmol/L   CO2 23 22 - 32 mmol/L   Glucose, Bld 97 65 - 99 mg/dL   BUN 14 6 - 20 mg/dL   Creatinine, Ser 0.82 0.44 - 1.00 mg/dL   Calcium 9.1 8.9 - 10.3 mg/dL   GFR calc non Af Amer >60 >60 mL/min   GFR calc Af Amer >60 >60 mL/min   Anion gap 13 5 - 15  hCG, serum, qualitative  Result Value Ref Range   Preg, Serum NEGATIVE NEGATIVE    Discharge Medications:     Medication List    TAKE these medications        divalproex 500 MG DR tablet  Commonly known as:  DEPAKOTE  Take 1 tablet (500 mg total) by mouth every 12 (twelve) hours.     HYDROcodone-acetaminophen 5-325 MG tablet  Commonly known as:  NORCO  Take 1-2 tablets by mouth every 4 (four) hours as needed for moderate pain.     levothyroxine 175 MCG tablet  Commonly known as:  SYNTHROID, LEVOTHROID  Take 175 mcg by mouth daily  before breakfast.     losartan 50 MG tablet  Commonly known as:  COZAAR  Take 50 mg by mouth daily.     methocarbamol 500 MG tablet  Commonly known as:  ROBAXIN  Take 1 tablet (500 mg total) by mouth every 6 (six) hours as needed for muscle spasms.     omeprazole 40 MG capsule  Commonly known as:  PRILOSEC  Take 40 mg by mouth daily.        Diagnostic Studies: Mr Shoulder Left W Contrast  05/24/2015  CLINICAL DATA:  History of left shoulder dislocation 4 weeks ago. Persistent pain and limits range of motion. EXAM: MR ARTHROGRAM OF THE left SHOULDER TECHNIQUE: Multiplanar, multisequence MR imaging of the left shoulder was performed following the administration of intra-articular contrast. CONTRAST:  See Injection Documentation. COMPARISON:  None. FINDINGS: Rotator cuff: Large full-thickness retracted rotator cuff tear. This involves the infraspinatus, supraspinatus and subscapularis tendons. The teres minor tendon is still intact. Muscles: Muscle tears involving the  subscapularis, pectoralis minor, short head biceps and coracobrachialis. Avulsion fracture involving the coracoid process. The 3 tendons are still intact to the tip but the muscles are injured. Biceps long head: Dislocated medially but intact. Acromioclavicular Joint: Mild to moderate degenerative changes. The acromion is relatively flat or type 1 in shape. Mild lateral downsloping. No undersurface spurring. Glenohumeral Joint: No significant degenerative changes. Synovitis and probable loose debris. Labrum: No definite labral tears. The glenohumeral ligaments are intact. Bones: Hill-Sachs impaction type fracture involving the humeral head posteriorly. Avulsion fracture involving the coracoid process. No definite bony Bankart fracture. IMPRESSION: 1. Large full-thickness retracted rotator cuff tear. Maximum retraction is 27.5 mm. 2. Avulsion fracture of the coracoid process with muscle injuries involving the coracobrachialis, short head biceps and pectoralis minor muscles. 3. Medial dislocation of the long head biceps tendon. 4. Hill-Sachs impaction type injury involving the humeral head. No definite bony Bankart fracture. 5. No definite labral tears. Electronically Signed   By: Marijo Sanes M.D.   On: 05/24/2015 16:47   Dg Fluoro Guided Needle Plc Aspiration/injection Loc  05/24/2015  CLINICAL DATA:  Left shoulder pain. FLUOROSCOPY TIME:  Radiation Exposure Index (as provided by the fluoroscopic device): 24.15 microGray*m^2 Fluoroscopy Time (in minutes and seconds):  18 seconds PROCEDURE: LEFT SHOULDER INJECTION UNDER FLUOROSCOPY An appropriate skin entrance site was determined. The site was marked, prepped with Betadine, draped in the usual sterile fashion, and infiltrated locally with buffered Lidocaine. A 3.5 inch 22 gauge spinal needle was advanced to the superomedial margin of the humeral head under intermittent fluoroscopy. 1 ml of Lidocaine injected easily. A mixture of 0.05 mL of MultiHance, 5 mL of 1%  lidocaine, and 15 mL of Isovue-M 200 was then used to opacify the left shoulder capsule. 11 mL total of this mixture was injected. No immediate complication. IMPRESSION: Technically successful left shoulder injection for MRI. Electronically Signed   By: Logan Bores M.D.   On: 05/24/2015 16:25    Disposition: 01-Home or Self Care      Discharge Instructions    Call MD / Call 911    Complete by:  As directed   If you experience chest pain or shortness of breath, CALL 911 and be transported to the hospital emergency room.  If you develope a fever above 101 F, pus (white drainage) or increased drainage or redness at the wound, or calf pain, call your surgeon's office.     Constipation Prevention    Complete by:  As  directed   Drink plenty of fluids.  Prune juice may be helpful.  You may use a stool softener, such as Colace (over the counter) 100 mg twice a day.  Use MiraLax (over the counter) for constipation as needed.     Diet - low sodium heart healthy    Complete by:  As directed      Discharge instructions    Complete by:  As directed   CPM machine 1 hour 3 times a day minimum Increased degrees daily Okay to shower in current dressing they are waterproof No lifting with right arm     Increase activity slowly as tolerated    Complete by:  As directed               Signed: Tylon Kemmerling SCOTT 06/14/2015, 10:18 PM

## 2015-06-14 NOTE — Progress Notes (Signed)
Patient stable Pain controlled Plan discharge today after OT

## 2015-06-15 ENCOUNTER — Encounter (HOSPITAL_COMMUNITY): Payer: Self-pay | Admitting: Orthopedic Surgery

## 2015-06-28 ENCOUNTER — Telehealth: Payer: Self-pay | Admitting: Neurology

## 2015-06-28 NOTE — Telephone Encounter (Signed)
Pt called wanting results of 72hr EEG. Please call back

## 2015-06-28 NOTE — Telephone Encounter (Signed)
error 

## 2015-06-28 NOTE — Telephone Encounter (Signed)
Called pt and relayed results per Dr Jaynee Eagles note. She stated she has f/u on 07/13/15 at 230pm. Dr Jaynee Eagles ok for her to f/u at that time. Advised her to continue to take her medication. She verbalized understanding.

## 2015-06-28 NOTE — Telephone Encounter (Signed)
The EEG wsa abnormal. It showed an area in the brain that had abnormal electrical activity that could be a focus of seizure. She should continue the Depakote and follow up with me in the office.

## 2015-07-13 ENCOUNTER — Telehealth: Payer: Self-pay | Admitting: Neurology

## 2015-07-13 ENCOUNTER — Ambulatory Visit (INDEPENDENT_AMBULATORY_CARE_PROVIDER_SITE_OTHER): Payer: BLUE CROSS/BLUE SHIELD | Admitting: Neurology

## 2015-07-13 ENCOUNTER — Encounter: Payer: Self-pay | Admitting: Neurology

## 2015-07-13 VITALS — BP 136/88 | HR 88 | Ht 61.5 in | Wt 167.6 lb

## 2015-07-13 DIAGNOSIS — R9401 Abnormal electroencephalogram [EEG]: Secondary | ICD-10-CM

## 2015-07-13 MED ORDER — LAMOTRIGINE 25 MG PO TABS: ORAL_TABLET | ORAL | Status: DC

## 2015-07-13 MED ORDER — ALPRAZOLAM 0.25 MG PO TABS: 0.2500 mg | ORAL_TABLET | Freq: Two times a day (BID) | ORAL | Status: DC | PRN

## 2015-07-13 MED ORDER — LAMOTRIGINE 100 MG PO TABS: 100.0000 mg | ORAL_TABLET | Freq: Two times a day (BID) | ORAL | Status: DC

## 2015-07-13 NOTE — Progress Notes (Addendum)
[facility]    Provider:  Dr Emma Glenn Referring Provider: Shirline Frees, MD Primary Care Physician:  Emma Frees, MD  CC: seizure  Interval history 07/13/2015: Discussed abnormal 3-day EEG(Epileptiform discharges seen in the left anterior temporal region (T3).) and that she should stay on AEDs.  Husband with her.  EEG abnormal and taking high dose of Wellbutrin likely caused a seizure in this patient with a lowered threshold for seizures. She has episodes of anxiety and she is worried about seizures, she has episodes she doesn;t feel right and worries this is a seizure aura vs anxiety. She is having side effects to Depakote, swelling, weight gain. Discussed other AED options, will start Lamictal as this is great for seizure prevention and for mood.  HPI: Emma Glenn is a 50 y.o. female here as a referral from Dr. Kenton Glenn for seizure. PMHx HTN, thyroid disease. Never had a seizure, no family history of seizure. There was a mistake and she was prescribed 964m of Wellbutrin a day. She was titrating up. She was on 4568mfor several months, her new prescription was 30028mhree times a day. When she started taking those she had several episodes of confusion, not knowing the family, she kept repeating herslef. Three of those then a seizure. 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 The next thing she knew she saw EMS and her arm hurt. Daughter also here and provides information and says patient was digging with no sand and then she fell and hit her shoulder full convulsions for 3-4 minutes with confusion afterwards. She was placed on Depakote in the ED. She was taking phentermine along with the Wellbutrin. No current complaints and patient feels fine and back to baseline since discontinuing both the Wellbutrin and phentermine. No focal neurologic deficits.   Reviewed notes, labs and imaging from outside physicians, which  showed:  EEG 04/29/2015: Unremarkable awake and drowsy routine inpatient EEG.   MRI of the brain: personally reviewed images, normal MRI of the brain.  Review of Systems: Patient complains of symptoms per HPI as well as the following symptoms: anxiety, no SOB, no CP. Pertinent negatives per HPI. All others negative.   Social History   Social History  . Marital Status: Married    Spouse Name: Emma Glenn Number of Children: 1  . Years of Education: 12   Occupational History  . Not on file.   Social History Main Topics  . Smoking status: Former Smoker -- 1.00 packs/day    Types: Cigarettes  . Smokeless tobacco: Not on file  . Alcohol Use: 0.0 oz/week    0 Standard drinks or equivalent per week     Comment: rare  . Drug Use: No     Comment: Hx of marijuana use  . Sexual Activity: Yes    Birth Control/ Protection: Surgical   Other Topics Concern  . Not on file   Social History Narrative   Lives with husband and sister   Caffeine use: Tea/coffee daily       Family History  Problem Relation Age of Onset  . Hypertension Mother   . Heart disease Mother   . Diabetes Father   . Heart disease Father   . Heart disease Brother   . Breast cancer Maternal Grandmother   . Seizures Neg Hx     Past Medical History  Diagnosis Date  . CIN I (cervical intraepithelial neoplasia I)   .  Labial cyst     Inclusion cyst  . Hypertension   . Thyroid disease     Graves dis-Radioactive Iodine  . Hypothyroidism   . GERD (gastroesophageal reflux disease)   . Seizures ([place])     last seizure 3/17? over dose wellbutrin    Past Surgical History  Procedure Laterality Date  . Tubal ligation    . Cervical biopsy  w/ loop electrode excision      Excision labial inclusion cyst  . Hand surgery    . Colposcopy    . Shoulder arthroscopy with open rotator cuff repair and distal clavicle acrominectomy Left 06/13/2015    Procedure: LEFT SHOULDER ARTHROSCOPY, DEBRIDEMENT, OPEN ROTATOR CUFF  REPAIR, CORACOID FRACTURE FIXATION, BICEPS TENODESIS;  Surgeon: Meredith Pel, MD;  Location: [facility];  Service: Orthopedics;  Laterality: Left;    Current Outpatient Prescriptions  Medication Sig Dispense Refill  . divalproex (DEPAKOTE) 500 MG DR tablet Take 1 tablet (500 mg total) by mouth every 12 (twelve) hours. 60 tablet 0  . HYDROcodone-acetaminophen (NORCO) 10-325 MG tablet Take 1 tablet by mouth every 6 (six) hours as needed.  0  . levothyroxine (SYNTHROID, LEVOTHROID) 175 MCG tablet Take 175 mcg by mouth daily before breakfast.    . losartan (COZAAR) 50 MG tablet Take 50 mg by mouth daily.    . methocarbamol (ROBAXIN) 500 MG tablet Take 1 tablet (500 mg total) by mouth every 6 (six) hours as needed for muscle spasms. 30 tablet 0  . omeprazole (PRILOSEC) 40 MG capsule Take 40 mg by mouth daily.     No current facility-administered medications for this visit.    Allergies as of 07/13/2015 - Review Complete 07/13/2015  Allergen Reaction Noted  . Doxycycline Other (See Comments) 06/06/2015  . Nitrofurantoin monohyd macro Other (See Comments) 02/19/2011  . Septra [sulfamethoxazole-trimethoprim] Other (See Comments) 06/06/2015    Vitals: BP 136/88 mmHg  Pulse 88  Ht 5' 1.5" (1.562 m)  Wt 167 lb 9.6 oz (76.023 kg)  BMI 31.16 kg/m2 Last Weight:  Wt Readings from Last 1 Encounters:  07/13/15 167 lb 9.6 oz (76.023 kg)   Last Height:   Ht Readings from Last 1 Encounters:  07/13/15 5' 1.5" (1.562 m)     Physical exam: Exam: Gen: NAD, conversant, well nourised, overweight, well groomed  CV: RRR, no MRG. No Carotid Bruits. No peripheral edema, warm, nontender Eyes: Conjunctivae clear without exudates or hemorrhage  Neuro: Detailed Neurologic Exam  Speech:  Speech is normal; fluent and spontaneous with normal comprehension.  Cognition:  The patient is oriented to person, place, and time;   recent and remote memory intact;   language  fluent;   normal attention, concentration,   fund of knowledge Cranial Nerves:  The pupils are equal, round, and reactive to light. The fundi are normal and spontaneous venous pulsations are present. Visual fields are full to finger confrontation. Extraocular movements are intact. Trigeminal sensation is intact and the muscles of mastication are normal. The face is symmetric. The palate elevates in the midline. Hearing intact. Voice is normal. Shoulder shrug is normal. The tongue has normal motion without fasciculations.   Coordination:  Normal finger to nose and heel to shin. (cannot test left arm, in a sling due to injury)  Gait:  Gait normal  Motor Observation:  No asymmetry, no atrophy, and no involuntary movements noted. Tone:  Normal muscle tone.   Posture:  Posture is normal. normal erect   Strength:  Strength is V/V in  the upper and lower limbs. (cannot test left arm, in a sling due to injury)   Sensation: intact to LT   Reflex Exam:  DTR's:  Deep tendon reflexes in the upper and lower extremities are symmetrical bilaterally.  Toes:  The toes are downgoing bilaterally.  Clonus:  Clonus is absent.     Assessment/Plan: 50 y.o. female here as a referral from Dr. Kenton Glenn for seizure. PMHx HTN, thyroid disease. No family history of seizures or previous history of seizures. Episodes of confusion started after taking Wellbutrin 900 mg daily until she had a generalized tonic-clonic seizure with dislocation of her left arm and postictal confusion. MRI of the brain was normal. Routine EEG was normal. Three day ambulatory eeg while she was not on Wellbutrin was abnormal with epileptiform discharges seen in the left anterior temporal region (T3). Given this findings it is likely that patient has a decreased threshold for seizures and Wellbutrin can exacerbate seizures especially at the dose she was given; Taking high dose of Wellbutrin likely  caused a seizure in this patient with a lowered threshold for seizures.  - Patient was started on Depakote with side effects, will start Lamictal instead which may also help with her mood disorder and anxiety -Patient is unable to drive, operate heavy machinery, perform activities at heights or participate in water activities until 6 months seizure free  - start Lamotrigine with a goal dose of 14m by mouth twice a day Week 1: Take 23m(one pill) by mouth at night every day Week 2: Take 2568mone pill) by mouth in the morning and night every day Week 3: Take 49m5mwo pills) by mouth every night and 25mg93mry morning  Week 4: Take 49mg 48m pills) by mouth every night and every morning  Week 5: Take 75mg (44me pills) every night and take 50 mg (two pills) every morning  Week 6: Take 75mg (t101m pills) every night and morning  Week 7: Take 100mg (fo8mills) every night and take 75mg (thr25mills) every morning  Week 8: Take 100mg (four4mls) every night and morning  Please call with any questions or if you develop a rash call immediately and stop taking the Lamotrigine.   WARNING/CAUTION: Even though it may be rare, some people may have very bad and sometimes deadly side effects when taking a drug. Tell your doctor or get medical help right away if you have any of the following signs or symptoms that may be related to a very bad side effect: . Signs of an allergic reaction, like rash; hives; itching; red, swollen, blistered, or peeling skin with or without fever; wheezing; tightness in the chest or throat; trouble breathing or talking; unusual hoarseness; or swelling of the mouth, face, lips, tongue, or throat. . Signs of infection like fever, chills, very bad sore throat, ear or sinus pain, cough, more sputum or change in color of sputum, pain with passing urine, mouth sores, or wound that will not heal. . Signs of liver problems like dark urine, feeling tired, not hungry, upset stomach or  stomach pain, light-colored stools, throwing up, or yellow skin or eyes. . Signs of kidney problems like unable to pass urine, change in how much urine is passed, blood in the urine, or a big weight gain. . ShortnessMarland Kitchenof breath, a big weight gain, or swelling in the arms or legs. . If seizures are worse or not the same after starting this drug. . Very bad muscle pain or weakness. .Marland Kitchen  Very bad joint pain or swelling. . Any unexplained bruising or bleeding. . Change in eyesight. . Very bad dizziness or passing out. . Change in balance. . Not able to control eye movements. . Chest pain or pressure. . Flu-like signs. . This drug may raise the chance of a very bad brain problem called aseptic meningitis. Call your doctor right away if you have a headache, fever, chills, very upset stomach or throwing up, stiff neck, rash, bright lights bother your eyes, feeling sleepy, or feeling confused.  All drugs may cause side effects. However, many people have no side effects or only have minor side effects. Call your doctor or get medical help if any of these side effects or any other side effects bother you or do not go away: . Dizziness. . Feeling sleepy. Marland Kitchen Upset stomach or throwing up. . Feeling tired or weak. . Shakiness. . Not able to sleep. . Runny nose. . Loose stools (diarrhea). These are not all of the side effects that may occur. If you have questions about side effects, call your doctor. Call your doctor for medical advice about side effects. You may report side effects to your national health agency  Discussed Patients with epilepsy have a small risk of sudden unexpected death, a condition referred to as sudden unexpected death in epilepsy (SUDEP). SUDEP is defined specifically as the sudden, unexpected, witnessed or unwitnessed, nontraumatic and nondrowning death in patients with epilepsy with or without evidence for a seizure, and excluding documented status epilepticus, in which post mortem examination does not  reveal a structural or toxicologic cause for death   Addendum 2015-07-26: EEG results 06/09/2015 were abnormal as below: This is a 71-hour video ambulatory EEG study, recorded from May 26, 2015 to May 29, 2015.    This was an abnormal prolonged ambulatory 72-hour video EEG.  Occasional epileptiform discharges seen in the left anterior temporal region (T3). These were present during sleep. Otherwise no electrographic or electroclinical events present. There was no focal or background slowing seen.  There were 1 push button events (patient logged having a headache very slowly. There was no video correlate to review as the patient was off camera but the EEG tracing was reviewed carefully). The EE did not correlate with any changes on the EEG.  Owing to this prolonged VEEG being abnormal showing focal discharges in the left temporal region, consider neuroimaging (brain MRI) to rule out a temporal lobe lesion if not previous or recent study done. Emma Glenn is already on an anticonvulsant medication (Divalproex) although she may have been placed on this for other reasons since she does not appear to have a history of prior seizures. Consider checking levels and optimizing the medication as necessary.    Sarina Ill, MD  [facility] [address] [place], [place] [address]  Phone [phone] Fax [phone]  A total of 45 minutes was spent face-to-face with this patient. Over half this time was spent on counseling patient on the seizure and abnormal eeg diagnosis and different diagnostic and therapeutic options available.

## 2015-07-13 NOTE — Patient Instructions (Signed)
Overall you are doing fairly well but I do want to suggest a few things today:   Remember to drink plenty of fluid, eat healthy meals and do not skip any meals. Try to eat protein with a every meal and eat a healthy snack such as fruit or nuts in between meals. Try to keep a regular sleep-wake schedule and try to exercise daily, particularly in the form of walking, 20-30 minutes a day, if you can.   As far as your medications are concerned, I would like to suggest:  - start Lamotrigine with a goal dose of 177m by mouth twice a day Week 1: Take 231m(one pill) by mouth at night every day Week 2: Take 2512mone pill) by mouth in the morning and night every day Week 3: Take 31m48mwo pills) by mouth every night and 25mg16mry morning  Week 4: Take 31mg 78m pills) by mouth every night and every morning  Week 5: Take 75mg (39me pills) every night and take 50 mg (two pills) every morning  Week 6: Take 75mg (t67m pills) every night and morning  Week 7: Take 100mg (fo68mills) every night and take 75mg (thr52mills) every morning  Week 8: Take 100mg (four8mls) every night and morning  Please call with any questions or if you develop a rash call immediately and stop taking the Lamotrigine.    I would like to see you back in 6 months, sooner if we need to. Please call us with anyKoreanterim questions, concerns, problems, updates or refill requests.    Our phone number is [phone] an after hours call service for urgent matters and there is a physician on-call for urgent questions. For any emergencies you know to call 911 or go to the nearest emergency room

## 2015-07-13 NOTE — Telephone Encounter (Signed)
Lilia , pharmacist at [facility] call to inform us Pt is being prescribed Depakote by another physician and pt is also taking lamoTRIgine (LAMICTAL) 25 MG tablet prescribed by Dr. Jaynee Eagles . They want to know if pt should still get the lamoTRIgine (LAMICTAL) 25 MG tablet. Please call [phone]

## 2015-07-13 NOTE — Telephone Encounter (Signed)
Called pharmacist back at [facility]. Advised per Dr Jaynee Eagles that pt is to still receive lamictal because she will be stopping the depakote per Dr Jaynee Eagles. She verbalized understanding and will make a note in the pt chart on her end.

## 2015-07-15 DIAGNOSIS — R9401 Abnormal electroencephalogram [EEG]: Secondary | ICD-10-CM | POA: Insufficient documentation

## 2015-07-24 ENCOUNTER — Telehealth: Payer: Self-pay | Admitting: *Deleted

## 2015-07-24 NOTE — Telephone Encounter (Signed)
Patient medical records, at the front desk for pick up.

## 2015-08-08 ENCOUNTER — Telehealth: Payer: Self-pay | Admitting: Neurology

## 2015-08-08 NOTE — Telephone Encounter (Signed)
Patient calling regarding possible medication reaction. Drug: lamoTRIgine (LAMICTAL) 25 MG tablet Start: 07/23/15 Symptoms: dizzy spells, not feeling comfortable , headache

## 2015-08-08 NOTE — Telephone Encounter (Signed)
Rn call patient back about the lamictal may be giving her some side effects. Pt stated she is taking depakote and lamictal for her seizures. Pt stated she has started taking the lamictal she has been taking dizzy spells, and headaches. The patient does not feel comfortable with the side effects. Pt would like for Dr.Ahern to know if she should continue or stop taking. Pt has not had any seizure activity. Message will be sent to Dr. Jaynee Eagles.

## 2015-08-09 NOTE — Telephone Encounter (Signed)
Dr Ahern- please advise 

## 2015-08-09 NOTE — Telephone Encounter (Signed)
Pt called office back. I relayed Dr Cathren Laine message below. She will stop lamictal. She will call if she still has SE from Depakote. Advised at that point we may need to make f/u if we start her on a new medication. She verbalized understanding.

## 2015-08-09 NOTE — Telephone Encounter (Signed)
LVM for pt to call. Gave [facility] phone number

## 2015-08-09 NOTE — Telephone Encounter (Signed)
She can stop the lamictal as long as she keeps taking the Depakote for seizure prevention. If she is still having side effects to the Depakote we can change her to something else.

## 2015-08-21 ENCOUNTER — Ambulatory Visit: Payer: BLUE CROSS/BLUE SHIELD | Admitting: Neurology

## 2015-09-14 ENCOUNTER — Other Ambulatory Visit: Payer: Self-pay | Admitting: Orthopedic Surgery

## 2015-09-18 ENCOUNTER — Other Ambulatory Visit: Payer: Self-pay | Admitting: Orthopedic Surgery

## 2015-09-19 ENCOUNTER — Encounter (HOSPITAL_COMMUNITY): Payer: Self-pay

## 2015-09-19 NOTE — Pre-Procedure Instructions (Signed)
Emma Glenn  09/19/2015      [facility] - [place], [place] - [address] [place] [address] [place] [address] [place] [address] Phone: [phone] Fax: [phone]  [place], [place] - [address] [place] [address] Phone: [phone] Fax: [phone]    Your procedure is scheduled on August 15  Report to [facility] at 2:15 P.M.  Call this number if you have problems the morning of surgery:  [phone]   Remember:  Do not eat food or drink liquids after midnight.   Take these medicines the morning of surgery with A SIP OF WATER alprazolam (xanax), divalproex (Depakote), hydrocodone-acetaminophen if needed, levothyroxine (synthroid), methocarbamol (robaxin), omeprazole (prilosec)  7 days prior to surgery STOP taking any Aspirin, Aleve, Naproxen, Ibuprofen, Motrin, Advil, Goody's, BC's, all herbal medications, fish oil, and all vitamins    Do not wear jewelry, make-up or nail polish.  Do not wear lotions, powders, or perfumes.  You may NOT wear deoderant.  Do not shave 48 hours prior to surgery.   Do not bring valuables to the hospital.  [facility] is not responsible for any belongings or valuables.  Contacts, dentures or bridgework may not be worn into surgery.  Leave your suitcase in the car.  After surgery it may be brought to your room.  For patients admitted to the hospital, discharge time will be determined by your treatment team.  Patients discharged the day of surgery will not be allowed to drive home.  Special instructions:   - Preparing For Surgery  Before surgery, you can play an important role. Because skin is not sterile, your skin needs to be as free of germs as possible. You can reduce the number of germs on your skin by washing with CHG (chlorahexidine gluconate) Soap before surgery.  CHG is an antiseptic cleaner which kills  germs and bonds with the skin to continue killing germs even after washing.  Please do not use if you have an allergy to CHG or antibacterial soaps. If your skin becomes reddened/irritated stop using the CHG.  Do not shave (including legs and underarms) for at least 48 hours prior to first CHG shower. It is OK to shave your face.  Please follow these instructions carefully.   1. Shower the NIGHT BEFORE SURGERY and the MORNING OF SURGERY with CHG.   2. If you chose to wash your hair, wash your hair first as usual with your normal shampoo.  3. After you shampoo, rinse your hair and body thoroughly to remove the shampoo.  4. Use CHG as you would any other liquid soap. You can apply CHG directly to the skin and wash gently with a scrungie or a clean washcloth.   5. Apply the CHG Soap to your body ONLY FROM THE NECK DOWN.  Do not use on open wounds or open sores. Avoid contact with your eyes, ears, mouth and genitals (private parts). Wash genitals (private parts) with your normal soap.  6. Wash thoroughly, paying special attention to the area where your surgery will be performed.  7. Thoroughly rinse your body with warm water from the neck down.  8. DO NOT shower/wash with your normal soap after using and rinsing off the CHG Soap.  9. Pat yourself dry with a CLEAN TOWEL.   10. Wear CLEAN PAJAMAS   11. Place CLEAN  SHEETS on your bed the night of your first shower and DO NOT SLEEP WITH PETS.    Day of Surgery: Do not apply any deodorants/lotions. Please wear clean clothes to the hospital/surgery center.      Please read over the following fact sheets that you were given. Pain Booklet and Coughing and Deep Breathing

## 2015-09-20 ENCOUNTER — Encounter (HOSPITAL_COMMUNITY): Payer: Self-pay

## 2015-09-20 ENCOUNTER — Encounter (HOSPITAL_COMMUNITY)
Admission: RE | Admit: 2015-09-20 | Discharge: 2015-09-20 | Disposition: A | Discharge status: Home / Self Care | Payer: BLUE CROSS/BLUE SHIELD | Source: Ambulatory Visit | Attending: Orthopedic Surgery | Admitting: Orthopedic Surgery

## 2015-09-20 DIAGNOSIS — Z01812 Encounter for preprocedural laboratory examination: Secondary | ICD-10-CM | POA: Diagnosis present

## 2015-09-20 DIAGNOSIS — M24612 Ankylosis, left shoulder: Secondary | ICD-10-CM | POA: Insufficient documentation

## 2015-09-20 LAB — BASIC METABOLIC PANEL
ANION GAP: 8 (ref 5–15)
BUN: 17 mg/dL (ref 6–20)
CHLORIDE: 104 mmol/L (ref 101–111)
CO2: 27 mmol/L (ref 22–32)
Calcium: 8.9 mg/dL (ref 8.9–10.3)
Creatinine, Ser: 0.81 mg/dL (ref 0.44–1.00)
GFR calc non Af Amer: >60 mL/min (ref >60)
Glucose, Bld: 143 mg/dL — ABNORMAL HIGH (ref 65–99)
POTASSIUM: 4.5 mmol/L (ref 3.5–5.1)
Sodium: 139 mmol/L (ref 135–145)

## 2015-09-20 LAB — CBC
HEMATOCRIT: 42.6 % (ref 36.0–46.0)
HEMOGLOBIN: 14.2 g/dL (ref 12.0–15.0)
MCH: 31.4 pg (ref 26.0–34.0)
MCHC: 33.3 g/dL (ref 30.0–36.0)
MCV: 94.2 fL (ref 78.0–100.0)
Platelets: 219 10*3/uL (ref 150–400)
RBC: 4.52 MIL/uL (ref 3.87–5.11)
RDW: 13.1 % (ref 11.5–15.5)
WBC: 10.1 10*3/uL (ref 4.0–10.5)

## 2015-09-20 LAB — HCG, SERUM, QUALITATIVE: PREG SERUM: NEGATIVE

## 2015-09-20 NOTE — Progress Notes (Signed)
PCP - Shirline Frees Cardiologist - denies  Chest x-ray - denies EKG - 04/28/15 Stress Test - denies ECHO - denies Cardiac Cath - denies    Patient denies shortness of breath, fever, cough and chest pain at PAT appointment

## 2015-09-25 MED ORDER — CEFAZOLIN SODIUM-DEXTROSE 2-4 GM/100ML-% IV SOLN
2.0000 g | INTRAVENOUS | Status: DC
  Filled 2015-09-25: qty 100

## 2015-09-25 NOTE — H&P (Signed)
Emma Glenn is an 50 y.o. female.   Chief Complaint: Left shoulder stiffness HPI: Emma Glenn is a 50 year old patient with left shoulder stiffness.  She underwent significant rotator cuff repair of supraspinatus interspace and subscap along with biceps tenodesis done through deltopectoral approach several months ago.  She had significant stiffness going into this surgery.  Her strength is returning but her range of motion is been difficult to regain.  She has plateaued in physical therapy.  She presents now for manipulation with intensive use of CPM machine post manipulation.  Past Medical History:  Diagnosis Date  . CIN I (cervical intraepithelial neoplasia I)   . GERD (gastroesophageal reflux disease)   . Hypertension   . Hypothyroidism   . Labial cyst    Inclusion cyst  . Seizures ([place])    last seizure 3/17? over dose wellbutrin  . Thyroid disease    Graves dis-Radioactive Iodine    Past Surgical History:  Procedure Laterality Date  . CERVICAL BIOPSY  W/ LOOP ELECTRODE EXCISION     Excision labial inclusion cyst  . CESAREAN SECTION    . COLPOSCOPY    . HAND SURGERY    . SHOULDER ARTHROSCOPY WITH OPEN ROTATOR CUFF REPAIR AND DISTAL CLAVICLE ACROMINECTOMY Left 06/13/2015   Procedure: LEFT SHOULDER ARTHROSCOPY, DEBRIDEMENT, OPEN ROTATOR CUFF REPAIR, CORACOID FRACTURE FIXATION, BICEPS TENODESIS;  Surgeon: Meredith Pel, MD;  Location: [place];  Service: Orthopedics;  Laterality: Left;  . TUBAL LIGATION      Family History  Problem Relation Age of Onset  . Hypertension Mother   . Heart disease Mother   . Diabetes Father   . Heart disease Father   . Heart disease Brother   . Breast cancer Maternal Grandmother   . Seizures Neg Hx    Social History:  reports that she quit smoking about 7 months ago. Her smoking use included Cigarettes. She smoked 1.00 pack per day. She has never used smokeless tobacco. She reports that she drinks alcohol. She reports that she does not use  drugs.  Allergies:  Allergies  Allergen Reactions  . Doxycycline Other (See Comments)    Flu like symptoms (high fever, chills)  . Nitrofurantoin Monohyd Macro Other (See Comments)    Flu like symptoms (high fever, chills)  . Septra [Sulfamethoxazole-Trimethoprim] Other (See Comments)    Flu like symptoms (high fever, chills)  . Lamictal [Lamotrigine] Rash    No prescriptions prior to admission.    No results found for this or any previous visit (from the past 48 hour(s)). No results found.  Review of Systems  Musculoskeletal: Positive for joint pain.  All other systems reviewed and are negative.   Last menstrual period 07/23/2015. Physical Exam  Constitutional: She appears well-developed.  HENT:  Head: Normocephalic.  Eyes: Pupils are equal, round, and reactive to light.  Neck: Normal range of motion.  Cardiovascular: Normal rate.   Respiratory: Effort normal.  Neurological: She is alert.  Skin: Skin is warm.  Psychiatric: She has a normal mood and affect.   examination of the left shoulder demonstrates external rotation at 15 abduction to about 30 compared to 70 on the left.  Circumflex compared to 70 on the right.  Forward flexion and abduction both below 90.  Cuff strength however is intact interspace stress subscap testing.   Assessment/Plan Impression is refractory frozen shoulder status post rotator cuff repair plan manipulation under anesthesia risks benefits discussed including but limited to and sectioned fracture possibility of rotator cuff tearing  shoulder instability.  Patient understands the risks and benefits but at this point I think to regain a little bit more motion however in favor manipulation with possible arthroscopic debridement of the rotator interval.  Patient understands the risks and benefits.  All questions answered.  Plan on at least a week of intensive CPM use following surgery.  This ability on the order of 6-7 hours per day.  Meredith Pel, MD 09/25/2015, 6:30 PM

## 2015-09-26 ENCOUNTER — Ambulatory Visit (HOSPITAL_COMMUNITY)
Admission: RE | Admit: 2015-09-26 | Discharge: 2015-09-26 | Disposition: A | Discharge status: Home / Self Care | Payer: BLUE CROSS/BLUE SHIELD | Source: Ambulatory Visit | Attending: Orthopedic Surgery | Admitting: Orthopedic Surgery

## 2015-09-26 ENCOUNTER — Encounter (HOSPITAL_COMMUNITY)
Admission: RE | Disposition: A | Discharge status: Home / Self Care | Payer: Self-pay | Source: Ambulatory Visit | Attending: Orthopedic Surgery

## 2015-09-26 ENCOUNTER — Ambulatory Visit (HOSPITAL_COMMUNITY): Payer: BLUE CROSS/BLUE SHIELD | Admitting: Certified Registered Nurse Anesthetist

## 2015-09-26 ENCOUNTER — Encounter (HOSPITAL_COMMUNITY): Payer: Self-pay | Admitting: *Deleted

## 2015-09-26 DIAGNOSIS — K219 Gastro-esophageal reflux disease without esophagitis: Secondary | ICD-10-CM | POA: Diagnosis not present

## 2015-09-26 DIAGNOSIS — Z79899 Other long term (current) drug therapy: Secondary | ICD-10-CM | POA: Insufficient documentation

## 2015-09-26 DIAGNOSIS — I10 Essential (primary) hypertension: Secondary | ICD-10-CM | POA: Diagnosis not present

## 2015-09-26 DIAGNOSIS — R569 Unspecified convulsions: Secondary | ICD-10-CM | POA: Insufficient documentation

## 2015-09-26 DIAGNOSIS — M24612 Ankylosis, left shoulder: Secondary | ICD-10-CM | POA: Diagnosis not present

## 2015-09-26 DIAGNOSIS — E890 Postprocedural hypothyroidism: Secondary | ICD-10-CM | POA: Diagnosis not present

## 2015-09-26 DIAGNOSIS — Z87891 Personal history of nicotine dependence: Secondary | ICD-10-CM | POA: Diagnosis not present

## 2015-09-26 HISTORY — PX: SHOULDER CLOSED REDUCTION: SHX1051

## 2015-09-26 SURGERY — MANIPULATION, JOINT, SHOULDER, WITH ANESTHESIA
Anesthesia: General | Site: Shoulder | Laterality: Left

## 2015-09-26 MED ORDER — CHLORHEXIDINE GLUCONATE 4 % EX LIQD: 60.0000 mL | Freq: Once | CUTANEOUS | Status: DC

## 2015-09-26 MED ORDER — MIDAZOLAM HCL 2 MG/2ML IJ SOLN
INTRAMUSCULAR | Status: AC
  Administered 2015-09-26: 2 mg
  Filled 2015-09-26: qty 2

## 2015-09-26 MED ORDER — MIDAZOLAM HCL 5 MG/ML IJ SOLN: 2.0000 mg | Freq: Once | INTRAMUSCULAR | Status: DC

## 2015-09-26 MED ORDER — FENTANYL CITRATE (PF) 250 MCG/5ML IJ SOLN
INTRAMUSCULAR | Status: AC
  Filled 2015-09-26: qty 5

## 2015-09-26 MED ORDER — BUPIVACAINE-EPINEPHRINE (PF) 0.5% -1:200000 IJ SOLN
INTRAMUSCULAR | Status: DC | PRN
  Administered 2015-09-26: 30 mL via PERINEURAL

## 2015-09-26 MED ORDER — KETOROLAC TROMETHAMINE 30 MG/ML IJ SOLN
INTRAMUSCULAR | Status: DC | PRN
  Administered 2015-09-26: 30 mg via INTRAVENOUS

## 2015-09-26 MED ORDER — ONDANSETRON HCL 4 MG/2ML IJ SOLN
INTRAMUSCULAR | Status: DC | PRN
  Administered 2015-09-26: 4 mg via INTRAVENOUS

## 2015-09-26 MED ORDER — FENTANYL CITRATE (PF) 100 MCG/2ML IJ SOLN
100.0000 ug | Freq: Once | INTRAMUSCULAR | Status: AC
  Administered 2015-09-26: 100 ug via INTRAVENOUS

## 2015-09-26 MED ORDER — MIDAZOLAM HCL 2 MG/2ML IJ SOLN
INTRAMUSCULAR | Status: AC
  Filled 2015-09-26: qty 2

## 2015-09-26 MED ORDER — HYDROCODONE-ACETAMINOPHEN 5-325 MG PO TABS: 1.0000 | ORAL_TABLET | Freq: Four times a day (QID) | ORAL | 0 refills | Status: DC | PRN

## 2015-09-26 MED ORDER — FENTANYL CITRATE (PF) 100 MCG/2ML IJ SOLN
INTRAMUSCULAR | Status: AC
  Filled 2015-09-26: qty 2

## 2015-09-26 MED ORDER — SUCCINYLCHOLINE CHLORIDE 20 MG/ML IJ SOLN
INTRAMUSCULAR | Status: DC | PRN
  Administered 2015-09-26: 60 mg via INTRAVENOUS
  Administered 2015-09-26: 80 mg via INTRAVENOUS

## 2015-09-26 MED ORDER — PROPOFOL 10 MG/ML IV BOLUS
INTRAVENOUS | Status: DC | PRN
  Administered 2015-09-26: 140 mg via INTRAVENOUS

## 2015-09-26 MED ORDER — LACTATED RINGERS IV SOLN
INTRAVENOUS | Status: DC
  Administered 2015-09-26: 15:00:00 via INTRAVENOUS

## 2015-09-26 SURGICAL SUPPLY — 7 items
GLOVE SS BIOGEL STRL SZ 8 (GLOVE) ×1 IMPLANT
GLOVE SUPERSENSE BIOGEL SZ 8 (GLOVE) ×1
GOWN STRL REUS W/ TWL LRG LVL3 (GOWN DISPOSABLE) IMPLANT
GOWN STRL REUS W/TWL LRG LVL3 (GOWN DISPOSABLE)
KIT ROOM TURNOVER OR (KITS) ×2 IMPLANT
PAD ARMBOARD 7.5X6 YLW CONV (MISCELLANEOUS) ×4 IMPLANT
SLING ARM FOAM STRAP MED (SOFTGOODS) ×1 IMPLANT

## 2015-09-26 NOTE — Anesthesia Procedure Notes (Addendum)
Date/Time: 09/26/2015 4:10 PM Performed by: Merrilyn Puma B Pre-anesthesia Checklist: Patient identified, Emergency Drugs available, Suction available and Patient being monitored Patient Re-evaluated:Patient Re-evaluated prior to inductionOxygen Delivery Method: Ambu bag and Circle system utilized Preoxygenation: Pre-oxygenation with 100% oxygen Intubation Type: IV induction Ventilation: Mask ventilation without difficulty Placement Confirmation: positive ETCO2 Dental Injury: Teeth and Oropharynx as per pre-operative assessment

## 2015-09-26 NOTE — Transfer of Care (Signed)
Immediate Anesthesia Transfer of Care Note  Patient: Emma Glenn  Procedure(s) Performed: Procedure(s): CLOSED MANIPULATION SHOULDER UNDER ANESTHESIA (Left)  Patient Location: PACU  Anesthesia Type:General  Level of Consciousness: awake, alert , oriented and patient cooperative  Airway & Oxygen Therapy: Patient Spontanous Breathing and Patient connected to nasal cannula oxygen  Post-op Assessment: Report given to RN and Post -op Vital signs reviewed and stable  Post vital signs: Reviewed and stable  Last Vitals:  Vitals:   09/26/15 1419 09/26/15 1420  BP: (!) 148/97   Pulse: 99   Resp: 18   Temp:  37.1 C    Last Pain:  Vitals:   09/26/15 1419  TempSrc: Oral      Patients Stated Pain Goal: 3 (46/95/07 2257)  Complications: No apparent anesthesia complications

## 2015-09-26 NOTE — Brief Op Note (Signed)
09/26/2015  4:19 PM  PATIENT:  Emma Glenn  50 y.o. female  PRE-OPERATIVE DIAGNOSIS:  LEFT SHOULDER ARTHROFIBROSIS  POST-OPERATIVE DIAGNOSIS:  Left shoulder arthrofibrosis  PROCEDURE:  Procedure(s): CLOSED MANIPULATION SHOULDER  SURGEON:  Surgeon(s): Meredith Pel, MD  ASSISTANT: None  ANESTHESIA:   general  EBL: None    No intake/output data recorded.  BLOOD ADMINISTERED: none  DRAINS: none   LOCAL MEDICATIONS USED:  none  SPECIMEN:  No Specimen  COUNTS:  YES  TOURNIQUET:  * No tourniquets in log *  DICTATION: .Other Dictation: Dictation Number [id number]  PLAN OF CARE: Discharge to home after PACU  PATIENT DISPOSITION:  PACU - hemodynamically stable

## 2015-09-26 NOTE — Anesthesia Procedure Notes (Signed)
Anesthesia Regional Block:  Interscalene brachial plexus block  Pre-Anesthetic Checklist: ,, timeout performed, Correct Patient, Correct Site, Correct Laterality, Correct Procedure, Correct Position, site marked, Risks and benefits discussed,  Surgical consent,  Pre-op evaluation,  At surgeon's request and post-op pain management  Laterality: Upper and Left  Prep: chloraprep       Needles:  Injection technique: Single-shot  Needle Type: Echogenic Stimulator Needle          Additional Needles:  Procedures: ultrasound guided (picture in chart) and nerve stimulator Interscalene brachial plexus block  Nerve Stimulator or Paresthesia:  Response: deltoid, 0.5 mA,   Additional Responses:   Narrative:  Injection made incrementally with aspirations every 5 mL.  Performed by: Personally  Anesthesiologist: Zyrell Carmean  Additional Notes: H+P and labs reviewed, risks and benefits discussed with patient, procedure tolerated well without complications

## 2015-09-26 NOTE — Interval H&P Note (Signed)
History and Physical Interval Note:  09/26/2015 7:14 AM  Emma Glenn  has presented today for surgery, with the diagnosis of LEFT SHOULDER ARTHROFIBROSIS  The various methods of treatment have been discussed with the patient and family. After consideration of risks, benefits and other options for treatment, the patient has consented to  Procedure(s): CLOSED MANIPULATION SHOULDER (Left) as a surgical intervention .  The patient's history has been reviewed, patient examined, no change in status, stable for surgery.  I have reviewed the patient's chart and labs.  Questions were answered to the patient's satisfaction.     DEAN,GREGORY SCOTT

## 2015-09-26 NOTE — Anesthesia Preprocedure Evaluation (Signed)
Anesthesia Evaluation  Patient identified by MRN, date of birth, ID band Patient awake    Reviewed: Allergy & Precautions, NPO status , Patient's Chart, lab work & pertinent test results  Airway Mallampati: II  TM Distance: >3 FB Neck ROM: Full    Dental  (+) Dental Advisory Given, Teeth Intact   Pulmonary former smoker,    breath sounds clear to auscultation       Cardiovascular hypertension, Pt. on medications  Rhythm:Regular     Neuro/Psych Seizures -,     GI/Hepatic GERD  Medicated and Controlled,  Endo/Other  Hypothyroidism   Renal/GU      Musculoskeletal   Abdominal   Peds  Hematology   Anesthesia Other Findings   Reproductive/Obstetrics                             Anesthesia Physical Anesthesia Plan  ASA: II  Anesthesia Plan: General and Regional   Post-op Pain Management:  Regional for Post-op pain   Induction: Intravenous  Airway Management Planned: Mask  Additional Equipment: None  Intra-op Plan:   Post-operative Plan:   Informed Consent: I have reviewed the patients History and Physical, chart, labs and discussed the procedure including the risks, benefits and alternatives for the proposed anesthesia with the patient or authorized representative who has indicated his/her understanding and acceptance.   Dental advisory given  Plan Discussed with: Surgeon and CRNA  Anesthesia Plan Comments:         Anesthesia Quick Evaluation

## 2015-09-27 ENCOUNTER — Encounter (HOSPITAL_COMMUNITY): Payer: Self-pay | Admitting: Orthopedic Surgery

## 2015-09-27 NOTE — Op Note (Signed)
NAMEVERNITA, TAGUE                 ACCOUNT NO.:  [id number]  MEDICAL RECORD NO.:  [id number]  LOCATION:  [facility]                         FACILITY:  [facility]  PHYSICIAN:  Anderson Malta, M.D.    DATE OF BIRTH:  Jun 09, 1965  DATE OF PROCEDURE: DATE OF DISCHARGE:  09/26/2015                              OPERATIVE REPORT   PREOPERATIVE DIAGNOSIS:  Left shoulder arthrofibrosis.  POSTOPERATIVE DIAGNOSIS:  Left shoulder arthrofibrosis.  PROCEDURE:  Left shoulder manipulation under anesthesia.  SURGEON:  Anderson Malta, M.D.  ASSISTANT:  None.  ANESTHESIA:  General.  INDICATIONS:  Emma Glenn is a patient who is several months out from left shoulder __________.  She had significant injury and has had shoulder arthrofibrosis and slow recovery of motion, presents now for manipulation under anesthesia.  PROCEDURE IN DETAIL:  The patient was brought to the operating room where general anesthetic was induced.  Time-out was called.  Left shoulder was then manipulated into about 140 of flexion from about 90 degrees of flexion.  Manipulated for about 70 degrees of isolated glenohumeral abduction to about 100.  External rotation at 15 degrees of abduction, initially 10 degrees, then picking about 40 degrees. Following manipulation, forward flexion, abduction, external rotation, the shoulder was taken through stability exam and found to be stable. Placed in a sling and she will start CPM machine tonight.     Anderson Malta, M.D.     GSD/MEDQ  D:  09/26/2015  T:  09/26/2015  Job:  [id number]

## 2015-09-28 NOTE — Anesthesia Postprocedure Evaluation (Signed)
Anesthesia Post Note  Patient: SALLY-ANNE WAMBLE  Procedure(s) Performed: Procedure(s) (LRB): CLOSED MANIPULATION SHOULDER UNDER ANESTHESIA (Left)  Patient location during evaluation: PACU Anesthesia Type: General and Regional Level of consciousness: awake Pain management: pain level controlled Vital Signs Assessment: post-procedure vital signs reviewed and stable Respiratory status: spontaneous breathing Cardiovascular status: stable Postop Assessment: no signs of nausea or vomiting Anesthetic complications: no    Last Vitals:  Vitals:   09/26/15 1635 09/26/15 1645  BP:  (!) 158/87  Pulse:  (!) 103  Resp:  16  Temp: 36.6 C     Last Pain:  Vitals:   09/26/15 1645  TempSrc:   PainSc: 0-No pain                 Marvell Stavola

## 2015-11-06 ENCOUNTER — Telehealth: Payer: Self-pay | Admitting: Neurology

## 2015-11-06 NOTE — Telephone Encounter (Signed)
Called and spoke to patient. Relayed per Dr Jaynee Eagles that she cannot sign letter. She can provide her last office note to her. Patient verbalized understanding and will come by office tomorrow to pick up a copy. She knows she has to sign a release form.

## 2015-11-06 NOTE — Telephone Encounter (Signed)
I cannot sign the letter patient wrote for me. She can request a copy of my office notes if she needs documentaion thanks.

## 2015-12-23 ENCOUNTER — Encounter (HOSPITAL_COMMUNITY): Payer: Self-pay | Admitting: Orthopedic Surgery

## 2016-01-22 ENCOUNTER — Ambulatory Visit: Payer: BLUE CROSS/BLUE SHIELD | Admitting: Neurology

## 2016-01-26 ENCOUNTER — Other Ambulatory Visit: Payer: Self-pay | Admitting: Family Medicine

## 2016-01-26 ENCOUNTER — Ambulatory Visit
Admission: RE | Admit: 2016-01-26 | Discharge: 2016-01-26 | Disposition: A | Discharge status: Home / Self Care | Payer: BLUE CROSS/BLUE SHIELD | Source: Ambulatory Visit | Attending: Family Medicine | Admitting: Family Medicine

## 2016-01-26 DIAGNOSIS — R0602 Shortness of breath: Secondary | ICD-10-CM

## 2016-02-21 ENCOUNTER — Institutional Professional Consult (permissible substitution): Payer: BLUE CROSS/BLUE SHIELD | Admitting: Pulmonary Disease

## 2016-02-23 ENCOUNTER — Institutional Professional Consult (permissible substitution): Payer: BLUE CROSS/BLUE SHIELD | Admitting: Pulmonary Disease

## 2016-02-26 ENCOUNTER — Encounter: Payer: Self-pay | Admitting: Pulmonary Disease

## 2016-02-26 ENCOUNTER — Ambulatory Visit (INDEPENDENT_AMBULATORY_CARE_PROVIDER_SITE_OTHER): Payer: BLUE CROSS/BLUE SHIELD | Admitting: Pulmonary Disease

## 2016-02-26 VITALS — BP 142/98 | HR 82 | Ht 61.5 in | Wt 192.6 lb

## 2016-02-26 DIAGNOSIS — R0600 Dyspnea, unspecified: Secondary | ICD-10-CM

## 2016-02-26 MED ORDER — BUDESONIDE-FORMOTEROL FUMARATE 160-4.5 MCG/ACT IN AERO: 2.0000 | INHALATION_SPRAY | Freq: Two times a day (BID) | RESPIRATORY_TRACT | 6 refills | Status: DC

## 2016-02-26 NOTE — Patient Instructions (Signed)
We will schedule you for pulmonary function tests Will start you on Symbicort inhaler 160/4.5. Re group in 1-2 months to review tests and response to therapy.

## 2016-02-26 NOTE — Progress Notes (Signed)
Emma Glenn    [id number]    06-25-1965  Primary Care Physician:HARRIS, Gwyndolyn Saxon, MD  Referring Physician: Shirline Frees, MD [address] [place], [place] [address]  Chief complaint:  Consult for evaluation of dyspnea.  HPI: Emma Glenn is a 51 year old former smoker with past history of seizures, hypothyroidism, hypertension. She was hospitalized in March 2017 for a seizure episode which was thought to be secondary to Wellbutrin. She has been discharged on Depakote with no recurrence of issues. She's had a fall during this incident with left shoulder rotator cuff injury. She underwent a swallow arthroscopy and 06/14/15. She has been inactive since his surgery and out of work. She's noticed a 50 pound weight gain. She is now back to work for the past month but has not lost weight yet.  Her chief complaint is progressive dyspnea on exertion which started after her surgery. She also has some dyspnea at rest. She denies any cough, sputum production, wheezing. She does not have any chest pain, palpitations.   Outpatient Encounter Prescriptions as of 02/26/2016  Medication Sig  . divalproex (DEPAKOTE) 500 MG DR tablet Take 1 tablet (500 mg total) by mouth every 12 (twelve) hours.  Marland Kitchen levothyroxine (SYNTHROID, LEVOTHROID) 175 MCG tablet Take 175 mcg by mouth daily before breakfast.  . losartan (COZAAR) 50 MG tablet Take 50 mg by mouth daily.  Marland Kitchen omeprazole (PRILOSEC) 40 MG capsule Take 40 mg by mouth daily.  Marland Kitchen ALPRAZolam (XANAX) 0.25 MG tablet Take 1 tablet (0.25 mg total) by mouth 2 (two) times daily as needed for anxiety. (Patient not taking: Reported on 02/26/2016)  . HYDROcodone-acetaminophen (NORCO) 5-325 MG tablet Take 1-2 tablets by mouth every 6 (six) hours as needed for moderate pain. (Patient not taking: Reported on 02/26/2016)  . methocarbamol (ROBAXIN) 500 MG tablet Take 1 tablet (500 mg total) by mouth every 6 (six) hours as needed for muscle spasms. (Patient not  taking: Reported on 02/26/2016)   No facility-administered encounter medications on file as of 02/26/2016.     Allergies as of 02/26/2016 - Review Complete 02/26/2016  Allergen Reaction Noted  . Doxycycline Other (See Comments) 06/06/2015  . Nitrofurantoin monohyd macro Other (See Comments) 02/19/2011  . Septra [sulfamethoxazole-trimethoprim] Other (See Comments) 06/06/2015  . Lamictal [lamotrigine] Rash 09/15/2015    Past Medical History:  Diagnosis Date  . CIN I (cervical intraepithelial neoplasia I)   . GERD (gastroesophageal reflux disease)   . Hypertension   . Hypothyroidism   . Labial cyst    Inclusion cyst  . Seizures ([place])    last seizure 3/17? over dose wellbutrin  . Thyroid disease    Graves dis-Radioactive Iodine    Past Surgical History:  Procedure Laterality Date  . CERVICAL BIOPSY  W/ LOOP ELECTRODE EXCISION     Excision labial inclusion cyst  . CESAREAN SECTION    . COLPOSCOPY    . HAND SURGERY    . SHOULDER ARTHROSCOPY WITH OPEN ROTATOR CUFF REPAIR AND DISTAL CLAVICLE ACROMINECTOMY Left 06/13/2015   Procedure: LEFT SHOULDER ARTHROSCOPY, DEBRIDEMENT, OPEN ROTATOR CUFF REPAIR, CORACOID FRACTURE FIXATION, BICEPS TENODESIS;  Surgeon: Meredith Pel, MD;  Location: [facility];  Service: Orthopedics;  Laterality: Left;  . SHOULDER CLOSED REDUCTION Left 09/26/2015   Procedure: CLOSED MANIPULATION SHOULDER UNDER ANESTHESIA;  Surgeon: Meredith Pel, MD;  Location: [facility];  Service: Orthopedics;  Laterality: Left;  . TUBAL LIGATION      Family History  Problem  Relation Age of Onset  . Hypertension Mother   . Heart disease Mother   . Diabetes Father   . Heart disease Father   . Heart disease Brother   . Breast cancer Maternal Grandmother   . Seizures Neg Hx     Social History   Social History  . Marital status: Married    Spouse name: Tommie Raymond  . Number of children: 1  . Years of education: 36   Occupational History  . Not on file.   Social History  Main Topics  . Smoking status: Former Smoker    Packs/day: 1.00    Types: Cigarettes    Quit date: 02/14/2015  . Smokeless tobacco: Never Used  . Alcohol use 0.0 oz/week     Comment: rare  . Drug use: No     Comment: Hx of marijuana use  . Sexual activity: Yes    Birth control/ protection: Surgical   Other Topics Concern  . Not on file   Social History Narrative   Lives with husband and sister   Caffeine use: Tea/coffee daily       Review of systems: Review of Systems  Constitutional: Negative for fever and chills.  HENT: Negative.   Eyes: Negative for blurred vision.  Respiratory: as per HPI  Cardiovascular: Negative for chest pain and palpitations.  Gastrointestinal: Negative for vomiting, diarrhea, blood per rectum. Genitourinary: Negative for dysuria, urgency, frequency and hematuria.  Musculoskeletal: Negative for myalgias, back pain and joint pain.  Skin: Negative for itching and rash.  Neurological: Negative for dizziness, tremors, focal weakness, seizures and loss of consciousness.  Endo/Heme/Allergies: Negative for environmental allergies.  Psychiatric/Behavioral: Negative for depression, suicidal ideas and hallucinations.  All other systems reviewed and are negative.  Physical Exam: Blood pressure (!) 142/98, pulse 82, height 5' 1.5" (1.562 m), weight 192 lb 9.6 oz (87.4 kg), SpO2 97 %. Gen:      No acute distress HEENT:  EOMI, sclera anicteric Neck:     No masses; no thyromegaly Lungs:    Clear to auscultation bilaterally; normal respiratory effort CV:         Regular rate and rhythm; no murmurs Abd:      + bowel sounds; soft, non-tender; no palpable masses, no distension Ext:    No edema; adequate peripheral perfusion Skin:      Warm and dry; no rash Neuro: alert and oriented x 3 Psych: normal mood and affect  Data Reviewed: Chest x-ray 01/26/16-no acute cardiopulmonary disease Chest x-ray 02/18/14-no acute cardiopulmonary disease Chest x-ray 04/02/13-no  acute cardiopulmonary disease Chest x-ray 09/30/12-no acute cardiopulmonary disease All images reviewed.  CBC    Component Value Date/Time   WBC 10.1 09/20/2015 1309   RBC 4.52 09/20/2015 1309   HGB 14.2 09/20/2015 1309   HCT 42.6 09/20/2015 1309   PLT 219 09/20/2015 1309   MCV 94.2 09/20/2015 1309   MCH 31.4 09/20/2015 1309   MCHC 33.3 09/20/2015 1309   RDW 13.1 09/20/2015 1309   LYMPHSABS 1.4 04/28/2015 1315   MONOABS 0.5 04/28/2015 1315   EOSABS 0.1 04/28/2015 1315   BASOSABS 0.0 04/28/2015 1315   BMET    Component Value Date/Time   NA 139 09/20/2015 1309   K 4.5 09/20/2015 1309   CL 104 09/20/2015 1309   CO2 27 09/20/2015 1309   GLUCOSE 143 (H) 09/20/2015 1309   BUN 17 09/20/2015 1309   CREATININE 0.81 09/20/2015 1309   CALCIUM 8.9 09/20/2015 1309   GFRNONAA >60 09/20/2015 1309  GFRAA >60 09/20/2015 1309     Assessment:  Evaluation for progressive dyspnea. I suspect she has COPD based on her smoking history. Her recent relative inactivity and deconditioning after surgery with weight gain is likely contributing to her symptoms. I will schedule her for pulmonary function tests and start on Symbicort inhaler. She'll return to clinic in 1-2 months to review test and response to therapy.  Plan/Recommendations: - PFTs - Start symbicort 160/4.5  Marshell Garfinkel MD [facility] Pager [phone] 02/26/2016, 11:14 AM  CC: Shirline Frees, MD

## 2016-03-07 ENCOUNTER — Telehealth: Payer: Self-pay | Admitting: Neurology

## 2016-03-07 MED ORDER — DIVALPROEX SODIUM 500 MG PO DR TAB: 500.0000 mg | DELAYED_RELEASE_TABLET | Freq: Two times a day (BID) | ORAL | 0 refills | Status: DC

## 2016-03-07 NOTE — Telephone Encounter (Signed)
Sent refill electronically to pt pharmacy as requested.

## 2016-03-07 NOTE — Telephone Encounter (Signed)
Patient requesting refill for divalproex (DEPAKOTE) 500 MG DR tablet.  Pharmacy- [facility].

## 2016-03-07 NOTE — Addendum Note (Signed)
Addended by: Hope Pigeon on: 03/07/2016 09:43 AM   Modules accepted: Orders

## 2016-03-14 ENCOUNTER — Other Ambulatory Visit: Payer: Self-pay | Admitting: Physician Assistant

## 2016-03-14 DIAGNOSIS — R1011 Right upper quadrant pain: Secondary | ICD-10-CM

## 2016-03-18 ENCOUNTER — Ambulatory Visit
Admission: RE | Admit: 2016-03-18 | Discharge: 2016-03-18 | Disposition: A | Discharge status: Home / Self Care | Payer: BLUE CROSS/BLUE SHIELD | Source: Ambulatory Visit | Attending: Physician Assistant | Admitting: Physician Assistant

## 2016-03-18 DIAGNOSIS — R1011 Right upper quadrant pain: Secondary | ICD-10-CM

## 2016-03-26 ENCOUNTER — Ambulatory Visit: Payer: BLUE CROSS/BLUE SHIELD | Admitting: Neurology

## 2016-04-15 ENCOUNTER — Other Ambulatory Visit (HOSPITAL_COMMUNITY): Payer: Self-pay | Admitting: Family Medicine

## 2016-04-15 DIAGNOSIS — R1011 Right upper quadrant pain: Secondary | ICD-10-CM

## 2016-04-23 ENCOUNTER — Ambulatory Visit (INDEPENDENT_AMBULATORY_CARE_PROVIDER_SITE_OTHER): Payer: BLUE CROSS/BLUE SHIELD | Admitting: Pulmonary Disease

## 2016-04-23 ENCOUNTER — Encounter: Payer: Self-pay | Admitting: Pulmonary Disease

## 2016-04-23 VITALS — BP 130/80 | HR 100 | Ht 62.0 in | Wt 191.0 lb

## 2016-04-23 DIAGNOSIS — R0600 Dyspnea, unspecified: Secondary | ICD-10-CM | POA: Diagnosis not present

## 2016-04-23 DIAGNOSIS — J439 Emphysema, unspecified: Secondary | ICD-10-CM | POA: Diagnosis not present

## 2016-04-23 LAB — PULMONARY FUNCTION TEST
DL/VA % PRED: 112 %
DL/VA: 5.27 ml/min/mmHg/L
DLCO COR % PRED: 93 %
DLCO cor: 21.4 ml/min/mmHg
DLCO unc % pred: 95 %
DLCO unc: 21.91 ml/min/mmHg
FEF 25-75 POST: 1.18 L/s
FEF 25-75 Pre: 1.02 L/sec
FEF2575-%CHANGE-POST: 15 %
FEF2575-%PRED-POST: 45 %
FEF2575-%Pred-Pre: 38 %
FEV1-%CHANGE-POST: 4 %
FEV1-%PRED-POST: 65 %
FEV1-%Pred-Pre: 62 %
FEV1-Post: 1.75 L
FEV1-Pre: 1.68 L
FEV1FVC-%Change-Post: 1 %
FEV1FVC-%PRED-PRE: 84 %
FEV6-%Change-Post: 2 %
FEV6-%Pred-Post: 77 %
FEV6-%Pred-Pre: 75 %
FEV6-PRE: 2.47 L
FEV6-Post: 2.53 L
FEV6FVC-%Change-Post: 0 %
FEV6FVC-%PRED-PRE: 103 %
FEV6FVC-%Pred-Post: 102 %
FVC-%CHANGE-POST: 3 %
FVC-%PRED-POST: 75 %
FVC-%PRED-PRE: 73 %
FVC-POST: 2.54 L
FVC-PRE: 2.47 L
POST FEV1/FVC RATIO: 69 %
PRE FEV6/FVC RATIO: 100 %
Post FEV6/FVC ratio: 100 %
Pre FEV1/FVC ratio: 68 %
RV % PRED: 79 %
RV: 1.42 L
TLC % pred: 76 %
TLC: 3.77 L

## 2016-04-23 MED ORDER — UMECLIDINIUM-VILANTEROL 62.5-25 MCG/INH IN AEPB: 1.0000 | INHALATION_SPRAY | Freq: Every day | RESPIRATORY_TRACT | 0 refills | Status: AC

## 2016-04-23 NOTE — Progress Notes (Signed)
Emma Glenn    [id number]    01-13-1966  Primary Care Physician:HARRIS, Gwyndolyn Saxon, MD  Referring Physician: Shirline Frees, MD [address] [place], [place] [address]  Chief complaint:   Follow up for COPD GOLD B (CAT score 11, 0 exacerbations over past year)  HPI: Emma Glenn is a 51 year old former smoker with past history of seizures, hypothyroidism, hypertension. She was hospitalized in March 2017 for a seizure episode which was thought to be secondary to Wellbutrin. She has been discharged on Depakote with no recurrence of issues. She's had a fall during this incident with left shoulder rotator cuff injury. She underwent a swallow arthroscopy and 06/14/15. She has been inactive since his surgery and out of work. She's noticed a 50 pound weight gain. She is now back to work for the past month but has not lost weight yet.  Her chief complaint is progressive dyspnea on exertion which started after her surgery. She also has some dyspnea at rest. She denies any cough, sputum production, wheezing. She does not have any chest pain, palpitations.   Interim History: She was started on Symbicort at last visit but do not notice any improvement in symptoms. She has PFTs done today and is here for discussion of the test.  Outpatient Encounter Prescriptions as of 04/23/2016  Medication Sig  . budesonide-formoterol (SYMBICORT) 160-4.5 MCG/ACT inhaler Inhale 2 puffs into the lungs 2 (two) times daily.  . divalproex (DEPAKOTE) 500 MG DR tablet Take 1 tablet (500 mg total) by mouth every 12 (twelve) hours.  Marland Kitchen levothyroxine (SYNTHROID, LEVOTHROID) 175 MCG tablet Take 175 mcg by mouth daily before breakfast.  . losartan (COZAAR) 50 MG tablet Take 50 mg by mouth daily.  . methocarbamol (ROBAXIN) 500 MG tablet Take 1 tablet (500 mg total) by mouth every 6 (six) hours as needed for muscle spasms.  Marland Kitchen omeprazole (PRILOSEC) 40 MG capsule Take 40 mg by mouth daily.  . [DISCONTINUED]  ALPRAZolam (XANAX) 0.25 MG tablet Take 1 tablet (0.25 mg total) by mouth 2 (two) times daily as needed for anxiety.  . [DISCONTINUED] HYDROcodone-acetaminophen (NORCO) 5-325 MG tablet Take 1-2 tablets by mouth every 6 (six) hours as needed for moderate pain.   No facility-administered encounter medications on file as of 04/23/2016.     Allergies as of 04/23/2016 - Review Complete 04/23/2016  Allergen Reaction Noted  . Doxycycline Other (See Comments) 06/06/2015  . Nitrofurantoin monohyd macro Other (See Comments) 02/19/2011  . Septra [sulfamethoxazole-trimethoprim] Other (See Comments) 06/06/2015  . Lamictal [lamotrigine] Rash 09/15/2015    Past Medical History:  Diagnosis Date  . CIN I (cervical intraepithelial neoplasia I)   . GERD (gastroesophageal reflux disease)   . Hypertension   . Hypothyroidism   . Labial cyst    Inclusion cyst  . Seizures ([employer])    last seizure 3/17? over dose wellbutrin  . Thyroid disease    Graves dis-Radioactive Iodine    Past Surgical History:  Procedure Laterality Date  . CERVICAL BIOPSY  W/ LOOP ELECTRODE EXCISION     Excision labial inclusion cyst  . CESAREAN SECTION    . COLPOSCOPY    . HAND SURGERY    . SHOULDER ARTHROSCOPY WITH OPEN ROTATOR CUFF REPAIR AND DISTAL CLAVICLE ACROMINECTOMY Left 06/13/2015   Procedure: LEFT SHOULDER ARTHROSCOPY, DEBRIDEMENT, OPEN ROTATOR CUFF REPAIR, CORACOID FRACTURE FIXATION, BICEPS TENODESIS;  Surgeon: Meredith Pel, MD;  Location: [facility];  Service: Orthopedics;  Laterality: Left;  .  SHOULDER CLOSED REDUCTION Left 09/26/2015   Procedure: CLOSED MANIPULATION SHOULDER UNDER ANESTHESIA;  Surgeon: Meredith Pel, MD;  Location: [facility];  Service: Orthopedics;  Laterality: Left;  . TUBAL LIGATION      Family History  Problem Relation Age of Onset  . Hypertension Mother   . Heart disease Mother   . Diabetes Father   . Heart disease Father   . Heart disease Brother   . Breast cancer Maternal Grandmother     . Seizures Neg Hx     Social History   Social History  . Marital status: Married    Spouse name: Tommie Raymond  . Number of children: 1  . Years of education: 75   Occupational History  . Not on file.   Social History Main Topics  . Smoking status: Former Smoker    Packs/day: 1.00    Types: Cigarettes    Quit date: 02/14/2015  . Smokeless tobacco: Never Used  . Alcohol use 0.0 oz/week     Comment: rare  . Drug use: No     Comment: Hx of marijuana use  . Sexual activity: Yes    Birth control/ protection: Surgical   Other Topics Concern  . Not on file   Social History Narrative   Lives with husband and sister   Caffeine use: Tea/coffee daily      Review of systems: Review of Systems  Constitutional: Negative for fever and chills.  HENT: Negative.   Eyes: Negative for blurred vision.  Respiratory: as per HPI  Cardiovascular: Negative for chest pain and palpitations.  Gastrointestinal: Negative for vomiting, diarrhea, blood per rectum. Genitourinary: Negative for dysuria, urgency, frequency and hematuria.  Musculoskeletal: Negative for myalgias, back pain and joint pain.  Skin: Negative for itching and rash.  Neurological: Negative for dizziness, tremors, focal weakness, seizures and loss of consciousness.  Endo/Heme/Allergies: Negative for environmental allergies.  Psychiatric/Behavioral: Negative for depression, suicidal ideas and hallucinations.  All other systems reviewed and are negative.  Physical Exam: Gen:      No acute distress HEENT:  EOMI, sclera anicteric Neck:     No masses; no thyromegaly Lungs:    Clear to auscultation bilaterally; normal respiratory effort CV:         Regular rate and rhythm; no murmurs Abd:      + bowel sounds; soft, non-tender; no palpable masses, no distension Ext:    No edema; adequate peripheral perfusion Skin:      Warm and dry; no rash Neuro: alert and oriented x 3 Psych: normal mood and affect  Data Reviewed: Chest x-ray  01/26/16-no acute cardiopulmonary disease Chest x-ray 02/18/14-no acute cardiopulmonary disease Chest x-ray 04/02/13-no acute cardiopulmonary disease Chest x-ray 09/30/12-no acute cardiopulmonary disease I have reviewed all images personally  PFTs 05/10/16 FVC 2.54 (70%) FEV1 1.75 (65%) F/F 69 TLC 76% DLCO 95% Moderate obstruction with mild restriction.  CBC    Component Value Date/Time   WBC 10.1 09/20/2015 1309   RBC 4.52 09/20/2015 1309   HGB 14.2 09/20/2015 1309   HCT 42.6 09/20/2015 1309   PLT 219 09/20/2015 1309   MCV 94.2 09/20/2015 1309   MCH 31.4 09/20/2015 1309   MCHC 33.3 09/20/2015 1309   RDW 13.1 09/20/2015 1309   LYMPHSABS 1.4 04/28/2015 1315   MONOABS 0.5 04/28/2015 1315   EOSABS 0.1 04/28/2015 1315   BASOSABS 0.0 04/28/2015 1315   BMET    Component Value Date/Time   NA 139 09/20/2015 1309   K 4.5 09/20/2015  1309   CL 104 09/20/2015 1309   CO2 27 09/20/2015 1309   GLUCOSE 143 (H) 09/20/2015 1309   BUN 17 09/20/2015 1309   CREATININE 0.81 09/20/2015 1309   CALCIUM 8.9 09/20/2015 1309   GFRNONAA >60 09/20/2015 1309   GFRAA >60 09/20/2015 1309     Assessment:  COPD GOLD B PFTs reviewed with her today. They show moderate obstruction consistent with COPD. Her recent relative inactivity and deconditioning after surgery with weight gain is likely contributing to her symptoms.  She has not improved on Symbicort. I'll switch her to an LAMA/LABA combination with Anoro. I have encouraged her to start a weight loss and exercise program.  Ex smoker Start lung cancer screening at age 22.  Plan/Recommendations: - Stop symbicort 160/4.5. Start Anoro.  Marshell Garfinkel MD [facility] Pager [phone] 04/23/2016, 1:27 PM  CC: Shirline Frees, MD

## 2016-04-23 NOTE — Progress Notes (Signed)
Patient ID: Emma Glenn, female   DOB: Jan 29, 1966, 51 y.o.   MRN: [id number] Patient seen in the office today and instructed on use of anoro ellipta. Patient expressed understanding and demonstrated technique.

## 2016-04-23 NOTE — Patient Instructions (Signed)
We will stop the Symbicort and start you on Anoro elipta. Your PFTs were discussed with you today. They show moderate obstruction consistent with COPD.  Return in 3 months

## 2016-04-23 NOTE — Progress Notes (Signed)
PFT completed today. Hgb collected via finger sensor (14.2g/dL).

## 2016-04-25 ENCOUNTER — Encounter (HOSPITAL_COMMUNITY)
Admission: RE | Admit: 2016-04-25 | Discharge: 2016-04-25 | Disposition: A | Discharge status: Home / Self Care | Payer: BLUE CROSS/BLUE SHIELD | Source: Ambulatory Visit | Attending: Family Medicine | Admitting: Family Medicine

## 2016-04-25 DIAGNOSIS — R1011 Right upper quadrant pain: Secondary | ICD-10-CM | POA: Diagnosis not present

## 2016-04-25 MED ORDER — TECHNETIUM TC 99M MEBROFENIN IV KIT
5.0000 | PACK | Freq: Once | INTRAVENOUS | Status: AC | PRN
  Administered 2016-04-25: 5 via INTRAVENOUS

## 2016-05-09 ENCOUNTER — Telehealth: Payer: Self-pay | Admitting: Pulmonary Disease

## 2016-05-09 DIAGNOSIS — J439 Emphysema, unspecified: Secondary | ICD-10-CM

## 2016-05-09 MED ORDER — GLYCOPYRROLATE-FORMOTEROL 9-4.8 MCG/ACT IN AERO: 2.0000 | INHALATION_SPRAY | Freq: Two times a day (BID) | RESPIRATORY_TRACT | 6 refills | Status: DC

## 2016-05-09 NOTE — Telephone Encounter (Signed)
Give bevespi. She will need A1AT levels and phenotype. We forgot to get at last visit. Please order. Thanks

## 2016-05-09 NOTE — Telephone Encounter (Signed)
Called and spoke with pt and she stated that she likes the good effects that she is having with the anoro, but she does not like the powder in this medication.  She is wanting to change to a medication that is not a powder.  PM please advise. Thanks  Allergies  Allergen Reactions  . Doxycycline Other (See Comments)    Flu like symptoms (high fever, chills)  . Nitrofurantoin Monohyd Macro Other (See Comments)    Flu like symptoms (high fever, chills)  . Septra [Sulfamethoxazole-Trimethoprim] Other (See Comments)    Flu like symptoms (high fever, chills)  . Lamictal [Lamotrigine] Rash

## 2016-05-09 NOTE — Telephone Encounter (Signed)
Called and spoke with pt and she is aware of change in meds.  She will come in next week for labs.  Nothing further is needed.

## 2016-05-16 ENCOUNTER — Encounter (INDEPENDENT_AMBULATORY_CARE_PROVIDER_SITE_OTHER): Payer: Self-pay | Admitting: Orthopedic Surgery

## 2016-05-16 ENCOUNTER — Ambulatory Visit (INDEPENDENT_AMBULATORY_CARE_PROVIDER_SITE_OTHER): Payer: BLUE CROSS/BLUE SHIELD | Admitting: Orthopedic Surgery

## 2016-05-16 ENCOUNTER — Telehealth (INDEPENDENT_AMBULATORY_CARE_PROVIDER_SITE_OTHER): Payer: Self-pay

## 2016-05-16 DIAGNOSIS — R252 Cramp and spasm: Secondary | ICD-10-CM

## 2016-05-16 NOTE — Progress Notes (Signed)
Office Visit Note   Patient: Emma Glenn           Date of Birth: 1965/09/16           MRN: [id number] Visit Date: 05/16/2016 Requested by: Shirline Frees, MD [address], [place] [address] PCP: Shirline Frees, MD  Subjective: No chief complaint on file.   HPI: Emma Glenn is a 51 year old patient with history of shoulder surgery as well as significant muscle cramping and spasms all over her body.  Shoulders been doing well.  She states that the cramping is been on for the past 3-4 months and is Much worse.  She takes mustard at night struck on the cramps down.  She is not taking anticholesterol medication.  She states her primary care provider is unable to determine a cause from the cramping.  She describes foot cramping finger cramping all body cramping as well as a pinching pain in her right upper quadrant.  She states that her blood work was okay.  The [place] on Epic and couldn't find it.  She states she had a hiatus scan and her gallbladder was okay.  She is taking magnesium.  She is on blood pressure medication.              ROS: All systems reviewed are negative as they relate to the chief complaint within the history of present illness.  Patient denies  fevers or chills. No weight loss  Assessment & Plan: Visit Diagnoses:  1. Muscle cramps     Plan: Impression is well-functioning shoulder following surgery.  In regards to this muscle cramping is hard to know exactly what is going on with that.  I will take her out of work that blood work was normal.  The fact that they responded to mustard indicates possible metabolic cause.  I think she should consider drinking some coconut water every day to adjust her electrolytes to diminish some of this cramping.  If not I would favor referral to rheumatologist and possible CT scan of the abdomen to evaluate this "mass" she feels in the right upper quadrant.  I palpated this area and did not feel any discrete masses.  I'll  see her back as needed and she'll call me if she wants to proceed with the workup and referral neurology referral for muscle biopsy also a consideration if the coconut water doesn't help  Follow-Up Instructions: Return if symptoms worsen or fail to improve.   Orders:  No orders of the defined types were placed in this encounter.  No orders of the defined types were placed in this encounter.     Procedures: No procedures performed   Clinical Data: No additional findings.  Objective: Vital Signs: There were no vitals taken for this visit.  Physical Exam:   Constitutional: Patient appears well-developed HEENT:  Head: Normocephalic Eyes:EOM are normal Neck: Normal range of motion Cardiovascular: Normal rate Pulmonary/chest: Effort normal Neurologic: Patient is alert Skin: Skin is warm Psychiatric: Patient has normal mood and affect    Ortho Exam: Orthopedic exam demonstrates pretty reasonable left shoulder range of motion.  Still does not have her full external rotation compared to the right-hand side but it is improved.  Motor surgery function in the hand is intact.  I'll detect any rigidity or cogwheeling of the muscles in the appendicular skeleton.  Pedal pulses palpable as well.  No definite paresthesias arms or legs.  No increased muscle tone in the arms or legs.  Negative Babinski negative clonus.  Specialty Comments:  No specialty comments available.  Imaging: No results found.   PMFS History: Patient Active Problem List   Diagnosis Date Noted  . Abnormal EEG 07/15/2015  . Rotator cuff tear 06/13/2015  . Tonic-clonic generalized seizure ([place]) 05/11/2015  . Seizure ([place]) 04/28/2015  . Anterior shoulder dislocation 04/28/2015  . Hypertension, essential   . CIN I (cervical intraepithelial neoplasia I)   . Hypothyroidism   . Labial cyst    Past Medical History:  Diagnosis Date  . CIN I (cervical intraepithelial neoplasia I)   . GERD (gastroesophageal reflux  disease)   . Hypertension   . Hypothyroidism   . Labial cyst    Inclusion cyst  . Seizures ([place])    last seizure 3/17? over dose wellbutrin  . Thyroid disease    Graves dis-Radioactive Iodine    Family History  Problem Relation Age of Onset  . Hypertension Mother   . Heart disease Mother   . Diabetes Father   . Heart disease Father   . Heart disease Brother   . Breast cancer Maternal Grandmother   . Seizures Neg Hx     Past Surgical History:  Procedure Laterality Date  . CERVICAL BIOPSY  W/ LOOP ELECTRODE EXCISION     Excision labial inclusion cyst  . CESAREAN SECTION    . COLPOSCOPY    . HAND SURGERY    . SHOULDER ARTHROSCOPY WITH OPEN ROTATOR CUFF REPAIR AND DISTAL CLAVICLE ACROMINECTOMY Left 06/13/2015   Procedure: LEFT SHOULDER ARTHROSCOPY, DEBRIDEMENT, OPEN ROTATOR CUFF REPAIR, CORACOID FRACTURE FIXATION, BICEPS TENODESIS;  Surgeon: Meredith Pel, MD;  Location: [facility];  Service: Orthopedics;  Laterality: Left;  . SHOULDER CLOSED REDUCTION Left 09/26/2015   Procedure: CLOSED MANIPULATION SHOULDER UNDER ANESTHESIA;  Surgeon: Meredith Pel, MD;  Location: [facility];  Service: Orthopedics;  Laterality: Left;  . TUBAL LIGATION     Social History   Occupational History  . Not on file.   Social History Main Topics  . Smoking status: Former Smoker    Packs/day: 1.00    Types: Cigarettes    Quit date: 02/14/2015  . Smokeless tobacco: Never Used  . Alcohol use 0.0 oz/week     Comment: rare  . Drug use: No     Comment: Hx of marijuana use  . Sexual activity: Yes    Birth control/ protection: Surgical

## 2016-05-17 ENCOUNTER — Telehealth (INDEPENDENT_AMBULATORY_CARE_PROVIDER_SITE_OTHER): Payer: Self-pay | Admitting: *Deleted

## 2016-05-17 DIAGNOSIS — M62838 Other muscle spasm: Secondary | ICD-10-CM

## 2016-05-17 DIAGNOSIS — R1011 Right upper quadrant pain: Secondary | ICD-10-CM

## 2016-05-17 NOTE — Telephone Encounter (Signed)
Should be CT abdomen for RUQ pain

## 2016-05-17 NOTE — Telephone Encounter (Signed)
Called patient and advised would speak with Dr Marlou Sa about whether he wants ordered with or without contrast and we would call her next week to get scheduled.

## 2016-05-17 NOTE — Telephone Encounter (Signed)
Patient called in this morning in regards to wanting to go ahead and get her MRI done please if possible? It had been discussed at her office visit and now she is ready to just go ahead and get the MRI if possible please. Her CB # [phone]. Thank you

## 2016-05-20 NOTE — Telephone Encounter (Signed)
With and without and pls send her for consult with neurologist thx

## 2016-05-20 NOTE — Telephone Encounter (Signed)
Both orders have been sent. Patient aware will be contacted

## 2016-05-20 NOTE — Telephone Encounter (Signed)
Patient called back stating she wished to proceed with scan that was discussed in her last OV. Order submitted for CT scan with and without contrast for RUQ pain per your last note with patient.

## 2016-05-22 ENCOUNTER — Ambulatory Visit (INDEPENDENT_AMBULATORY_CARE_PROVIDER_SITE_OTHER): Payer: BLUE CROSS/BLUE SHIELD | Admitting: Orthopedic Surgery

## 2016-05-29 ENCOUNTER — Ambulatory Visit
Admission: RE | Admit: 2016-05-29 | Discharge: 2016-05-29 | Disposition: A | Discharge status: Home / Self Care | Payer: BLUE CROSS/BLUE SHIELD | Source: Ambulatory Visit | Attending: Orthopedic Surgery | Admitting: Orthopedic Surgery

## 2016-05-29 DIAGNOSIS — R1011 Right upper quadrant pain: Secondary | ICD-10-CM

## 2016-05-29 MED ORDER — IOPAMIDOL (ISOVUE-300) INJECTION 61%
100.0000 mL | Freq: Once | INTRAVENOUS | Status: AC | PRN
  Administered 2016-05-29: 100 mL via INTRAVENOUS

## 2016-05-29 NOTE — Progress Notes (Signed)
Does she have f/u appt

## 2016-05-30 ENCOUNTER — Ambulatory Visit (INDEPENDENT_AMBULATORY_CARE_PROVIDER_SITE_OTHER): Payer: BLUE CROSS/BLUE SHIELD | Admitting: Orthopedic Surgery

## 2016-05-30 ENCOUNTER — Telehealth: Payer: Self-pay | Admitting: Internal Medicine

## 2016-05-30 ENCOUNTER — Encounter (INDEPENDENT_AMBULATORY_CARE_PROVIDER_SITE_OTHER): Payer: Self-pay | Admitting: Orthopedic Surgery

## 2016-05-30 DIAGNOSIS — R1011 Right upper quadrant pain: Secondary | ICD-10-CM

## 2016-05-30 NOTE — Telephone Encounter (Signed)
Pt scheduled to see Dr. Hilarie Fredrickson 06/04/16@3 :45pm. Please notify pt of appt.

## 2016-05-30 NOTE — Telephone Encounter (Signed)
Patient notified of this appointment.

## 2016-05-30 NOTE — Progress Notes (Signed)
Office Visit Note   Patient: Emma Glenn           Date of Birth: 1965-09-16           MRN: [id number] Visit Date: 05/30/2016 Requested by: Shirline Frees, MD [address] [place], [place] [address] PCP: Shirline Frees, MD  Subjective: No chief complaint on file.   HPI: Emma Glenn is a 51 year old female with low or abdominal pain on the right severe whole-body muscle cramping and fatigue who presents for follow-up after CT scan of abdomen.  She tried Water to Help with Cramps and That Did Not Help.  She's Had a Workup Done by Her Primary Care Provider to Metabolic since for Cramps and That Was Also Normal.  Patient Does Not Drink Alcohol.  She Has Significant Fatigue and Low Energy since All This Started Several Months Ago.  Her CT Scan Is Reviewed and She Has Enlargement of the Liver to a Fairly Significant Degree.  I Think This Is Likely Causing Her symptoms of abdominal pain particularly on that right lower quadrant area below the rib cage.  It may also be associated with her cramps.              ROS: All systems reviewed are negative as they relate to the chief complaint within the history of present illness.  Patient denies  fevers or chills.   Assessment & Plan: Visit Diagnoses:  1. RUQ pain     Plan: Impression is right upper quadrant abdominal pain with CT scan which shows enlargement of the liver and to some degree fatty infiltration of the liver.  It also appears on my review that there is a cyst in one of the middle lobes of the liver which is not commented upon in the report.  Clearly there is a cystic structure present.  I think her liver enlargement and fatigue and cramps are all related.  She needs a GI workup.  I have called [facility] and left a message for the on-call physician to get back in touch with me so I can make a plan for her to be seen.  I have not heard anything so  in that regard  I'll arrange for an appointment for her to to be seen next week by  one of their providers.  Follow-Up Instructions: No Follow-up on file.   Orders:  No orders of the defined types were placed in this encounter.  No orders of the defined types were placed in this encounter.     Procedures: No procedures performed   Clinical Data: No additional findings.  Objective: Vital Signs: There were no vitals taken for this visit.  Physical Exam:   Constitutional: Patient appears well-developed HEENT:  Head: Normocephalic Eyes:EOM are normal Neck: Normal range of motion Cardiovascular: Normal rate Pulmonary/chest: Effort normal Neurologic: Patient is alert Skin: Skin is warm Psychiatric: Patient has normal mood and affect    Ortho Exam: Orthopedic exam demonstrates normal gait and alignment normal muscle tone in the upper and lower extremities right upper quadrant tenderness no real pain with forward lateral bending.  No jaundice is noted all limbs are perfused  Specialty Comments:  No specialty comments available.  Imaging: No results found.   PMFS History: Patient Active Problem List   Diagnosis Date Noted  . Abnormal EEG 07/15/2015  . Rotator cuff tear 06/13/2015  . Tonic-clonic generalized seizure ([facility]) 05/11/2015  . Seizure ([facility]) 04/28/2015  . Anterior shoulder dislocation 04/28/2015  .  Hypertension, essential   . CIN I (cervical intraepithelial neoplasia I)   . Hypothyroidism   . Labial cyst    Past Medical History:  Diagnosis Date  . CIN I (cervical intraepithelial neoplasia I)   . GERD (gastroesophageal reflux disease)   . Hypertension   . Hypothyroidism   . Labial cyst    Inclusion cyst  . Seizures ([facility])    last seizure 3/17? over dose wellbutrin  . Thyroid disease    Graves dis-Radioactive Iodine    Family History  Problem Relation Age of Onset  . Hypertension Mother   . Heart disease Mother   . Diabetes Father   . Heart disease Father   . Heart disease Brother   . Breast cancer Maternal Grandmother   .  Seizures Neg Hx     Past Surgical History:  Procedure Laterality Date  . CERVICAL BIOPSY  W/ LOOP ELECTRODE EXCISION     Excision labial inclusion cyst  . CESAREAN SECTION    . COLPOSCOPY    . HAND SURGERY    . SHOULDER ARTHROSCOPY WITH OPEN ROTATOR CUFF REPAIR AND DISTAL CLAVICLE ACROMINECTOMY Left 06/13/2015   Procedure: LEFT SHOULDER ARTHROSCOPY, DEBRIDEMENT, OPEN ROTATOR CUFF REPAIR, CORACOID FRACTURE FIXATION, BICEPS TENODESIS;  Surgeon: Meredith Pel, MD;  Location: [facility];  Service: Orthopedics;  Laterality: Left;  . SHOULDER CLOSED REDUCTION Left 09/26/2015   Procedure: CLOSED MANIPULATION SHOULDER UNDER ANESTHESIA;  Surgeon: Meredith Pel, MD;  Location: [facility];  Service: Orthopedics;  Laterality: Left;  . TUBAL LIGATION     Social History   Occupational History  . Not on file.   Social History Main Topics  . Smoking status: Former Smoker    Packs/day: 1.00    Types: Cigarettes    Quit date: 02/14/2015  . Smokeless tobacco: Never Used  . Alcohol use 0.0 oz/week     Comment: rare  . Drug use: No     Comment: Hx of marijuana use  . Sexual activity: Yes    Birth control/ protection: Surgical

## 2016-05-31 ENCOUNTER — Encounter: Payer: Self-pay | Admitting: *Deleted

## 2016-06-04 ENCOUNTER — Ambulatory Visit (INDEPENDENT_AMBULATORY_CARE_PROVIDER_SITE_OTHER): Payer: BLUE CROSS/BLUE SHIELD | Admitting: Internal Medicine

## 2016-06-04 ENCOUNTER — Other Ambulatory Visit (INDEPENDENT_AMBULATORY_CARE_PROVIDER_SITE_OTHER): Payer: BLUE CROSS/BLUE SHIELD

## 2016-06-04 ENCOUNTER — Encounter: Payer: Self-pay | Admitting: Internal Medicine

## 2016-06-04 VITALS — BP 124/70 | HR 102 | Ht 61.5 in | Wt 185.0 lb

## 2016-06-04 DIAGNOSIS — R1011 Right upper quadrant pain: Secondary | ICD-10-CM

## 2016-06-04 DIAGNOSIS — R635 Abnormal weight gain: Secondary | ICD-10-CM

## 2016-06-04 DIAGNOSIS — R252 Cramp and spasm: Secondary | ICD-10-CM

## 2016-06-04 DIAGNOSIS — R160 Hepatomegaly, not elsewhere classified: Secondary | ICD-10-CM

## 2016-06-04 DIAGNOSIS — R933 Abnormal findings on diagnostic imaging of other parts of digestive tract: Secondary | ICD-10-CM

## 2016-06-04 LAB — CBC WITH DIFFERENTIAL/PLATELET
BASOS ABS: 0 10*3/uL (ref 0.0–0.1)
Basophils Relative: 0.5 % (ref 0.0–3.0)
EOS ABS: 0.2 10*3/uL (ref 0.0–0.7)
Eosinophils Relative: 2.3 % (ref 0.0–5.0)
HEMATOCRIT: 40.6 % (ref 36.0–46.0)
HEMOGLOBIN: 13.7 g/dL (ref 12.0–15.0)
LYMPHS PCT: 24.3 % (ref 12.0–46.0)
Lymphs Abs: 2.2 10*3/uL (ref 0.7–4.0)
MCHC: 33.7 g/dL (ref 30.0–36.0)
MCV: 93.6 fl (ref 78.0–100.0)
MONOS PCT: 7.2 % (ref 3.0–12.0)
Monocytes Absolute: 0.6 10*3/uL (ref 0.1–1.0)
Neutro Abs: 5.9 10*3/uL (ref 1.4–7.7)
Neutrophils Relative %: 65.7 % (ref 43.0–77.0)
Platelets: 315 10*3/uL (ref 150.0–400.0)
RBC: 4.34 Mil/uL (ref 3.87–5.11)
RDW: 15 % (ref 11.5–15.5)
WBC: 9 10*3/uL (ref 4.0–10.5)

## 2016-06-04 LAB — IBC PANEL
IRON: 47 ug/dL (ref 42–145)
Saturation Ratios: 10.6 % — ABNORMAL LOW (ref 20.0–50.0)
TRANSFERRIN: 318 mg/dL (ref 212.0–360.0)

## 2016-06-04 LAB — COMPREHENSIVE METABOLIC PANEL
ALBUMIN: 4 g/dL (ref 3.5–5.2)
ALK PHOS: 107 U/L (ref 39–117)
ALT: 43 U/L — AB (ref 0–35)
AST: 63 U/L — AB (ref 0–37)
BILIRUBIN TOTAL: 0.7 mg/dL (ref 0.2–1.2)
BUN: 12 mg/dL (ref 6–23)
CALCIUM: 9.5 mg/dL (ref 8.4–10.5)
CO2: 27 mEq/L (ref 19–32)
CREATININE: 0.93 mg/dL (ref 0.40–1.20)
Chloride: 95 mEq/L — ABNORMAL LOW (ref 96–112)
GFR: 67.19 mL/min (ref >60.00)
Glucose, Bld: 463 mg/dL — ABNORMAL HIGH (ref 70–99)
Potassium: 4.3 mEq/L (ref 3.5–5.1)
Sodium: 131 mEq/L — ABNORMAL LOW (ref 135–145)
TOTAL PROTEIN: 8.1 g/dL (ref 6.0–8.3)

## 2016-06-04 LAB — PROTIME-INR
INR: 1.2 ratio — AB (ref 0.8–1.0)
Prothrombin Time: 12.6 s (ref 9.6–13.1)

## 2016-06-04 LAB — IGA: IgA: 255 mg/dL (ref 68–378)

## 2016-06-04 LAB — CORTISOL: CORTISOL PLASMA: 6.8 ug/dL

## 2016-06-04 LAB — FERRITIN: Ferritin: 56.5 ng/mL (ref 10.0–291.0)

## 2016-06-04 LAB — TSH: TSH: 4.45 u[IU]/mL (ref 0.35–4.50)

## 2016-06-04 NOTE — Progress Notes (Signed)
Patient ID: NEAH SPORRER, female   DOB: Mar 04, 1965, 51 y.o.   MRN: [id number] HPI: Adelise Buswell is a 51 year old female with a past medical history of GERD, hypertension, hypothyroidism, Graves' disease status post radioactive iodine therapy, who is seen in consultation at the request of Dr. Kenton Kingfisher to evaluate hepatomegaly, right upper quadrant abdominal pain, abnormal GI imaging/abdominal lymphadenopathy. She is here alone today.  She reports over the last several months having a myriad of symptoms one of the most significant is severe muscle cramps. This involved all the muscles in her bodies and off and on fashion. Including forearms, fingers, legs and feet. Slightly better now that she is out of work. She denies feeling numbness but does feel large muscle group weakness and feels weak shampooing her hair and her thighs feel weak when she walks any significant distance or climbs stairs.  Several from this she's noticed some shortness of breath particularly with exertion. She quit tobacco last year. She was seen by pulmonology and diagnosed with mild COPD. She was started on an inhaler. This is helped somewhat.  She developed a seizure due to likely supratherapeutic doses of bupropion on 04/28/2015. This seizure was associated with a shoulder injury requiring rotator cuff surgery in May 2017. She was placed on Depakote for some time but is now off of anticonvulsant therapy.  From a GI perspective she has developed right upper quadrant abdominal pain. This is sharp and at times cramping in nature. It is worse at night. It feels sore today. She feels significant abdominal bloating with eating. She's had some nausea without vomiting. She denies heartburn but she takes omeprazole 40 mg daily. This controls her heartburn completely. She denies dysphagia and odynophagia. He has gained 50 pounds in the past year for unclear reasons. Bowel movements have been slightly altered for her in that they've been more  loose. Normal for her is to formed stools daily. Most recently she can have stools 3-4 times per day occasionally they are loose. Can be urgent after eating on occasion. No blood in her stool or melena. She had colonoscopy within the last year and [facility]. She reports having small polyps removed but she received a call stating her recall would be 10 years.  She denies fever, night sweats and rashes.  Her only other medicines in addition to omeprazole and levothyroxine is losartan and her new inhaler  Workup of right upper quadrant pain has included abdominal imaging specifically ultrasound in February 2018 which showed fatty liver but no other focal abnormality. Follow-up HIDA scan in March 2018 showed normal visualization of the gallbladder and bowel activity. Ejection fraction was low normal at 39%.  CT abdomen was performed 6 days ago which showed no abdominal mass other than hepatomegaly. There was hepatic steatosis. There is new mild upper abdominal adenopathy favored to be benign but CT pelvis was recommended.  Past Medical History:  Diagnosis Date  . CIN I (cervical intraepithelial neoplasia I)   . GERD (gastroesophageal reflux disease)   . Hepatic steatosis   . Hypertension   . Hypothyroidism   . Labial cyst    Inclusion cyst  . Seizures ([place])    last seizure 3/17? over dose wellbutrin  . Thyroid disease    Graves dis-Radioactive Iodine    Past Surgical History:  Procedure Laterality Date  . CERVICAL BIOPSY  W/ LOOP ELECTRODE EXCISION     Excision labial inclusion cyst  . CESAREAN SECTION    . COLPOSCOPY    .  HAND SURGERY    . SHOULDER ARTHROSCOPY WITH OPEN ROTATOR CUFF REPAIR AND DISTAL CLAVICLE ACROMINECTOMY Left 06/13/2015   Procedure: LEFT SHOULDER ARTHROSCOPY, DEBRIDEMENT, OPEN ROTATOR CUFF REPAIR, CORACOID FRACTURE FIXATION, BICEPS TENODESIS;  Surgeon: Meredith Pel, MD;  Location: [place];  Service: Orthopedics;  Laterality: Left;  . SHOULDER  CLOSED REDUCTION Left 09/26/2015   Procedure: CLOSED MANIPULATION SHOULDER UNDER ANESTHESIA;  Surgeon: Meredith Pel, MD;  Location: [place];  Service: Orthopedics;  Laterality: Left;  . TUBAL LIGATION      Outpatient Medications Prior to Visit  Medication Sig Dispense Refill  . Glycopyrrolate-Formoterol (BEVESPI AEROSPHERE) 9-4.8 MCG/ACT AERO Inhale 2 puffs into the lungs 2 (two) times daily. 10.7 g 6  . levothyroxine (SYNTHROID, LEVOTHROID) 175 MCG tablet Take 175 mcg by mouth daily before breakfast.    . losartan (COZAAR) 50 MG tablet Take 50 mg by mouth daily.    Marland Kitchen omeprazole (PRILOSEC) 40 MG capsule Take 40 mg by mouth daily.    . divalproex (DEPAKOTE) 500 MG DR tablet Take 1 tablet (500 mg total) by mouth every 12 (twelve) hours. (Patient not taking: Reported on 06/04/2016) 60 tablet 0  . methocarbamol (ROBAXIN) 500 MG tablet Take 1 tablet (500 mg total) by mouth every 6 (six) hours as needed for muscle spasms. (Patient not taking: Reported on 06/04/2016) 30 tablet 0   No facility-administered medications prior to visit.     Allergies  Allergen Reactions  . Doxycycline Other (See Comments)    Flu like symptoms (high fever, chills)  . Nitrofurantoin Monohyd Macro Other (See Comments)    Flu like symptoms (high fever, chills)  . Septra [Sulfamethoxazole-Trimethoprim] Other (See Comments)    Flu like symptoms (high fever, chills)  . Lamictal [Lamotrigine] Rash    Family History  Problem Relation Age of Onset  . Hypertension Mother   . Heart disease Mother   . Diabetes Father   . Heart disease Father   . Heart disease Brother   . Breast cancer Maternal Grandmother   . Seizures Neg Hx     Social History  Substance Use Topics  . Smoking status: Former Smoker    Packs/day: 1.00    Types: Cigarettes    Quit date: 02/14/2015  . Smokeless tobacco: Never Used  . Alcohol use 0.0 oz/week     Comment: rare    ROS: As per history of present illness, otherwise negative  BP  124/70   Pulse (!) 102   Ht 5' 1.5" (1.562 m)   Wt 185 lb (83.9 kg)   LMP 03/30/2016 (Exact Date) Comment: no chance of being pregnancy  SpO2 96%   BMI 34.39 kg/m  Constitutional: Well-developed and well-nourished. No distress. HEENT: Normocephalic and atraumatic. Oropharynx is clear and moist. Conjunctivae are normal.  No scleral icterus. Neck: Neck supple. Trachea midline. Cardiovascular: Normal rate, regular rhythm and intact distal pulses. No M/R/G Pulmonary/chest: Effort normal and breath sounds normal. No wheezing, rales or rhonchi. Abdominal: Soft, mildly tender in upper abdominal with hepatomegaly 2-3 cm below costal margin, nondistended. Bowel sounds active, obese. Extremities: no clubbing, cyanosis, or edema Neurological: Alert and oriented to person place and time. Skin: Skin is warm and dry. Scattered spider angiomata on her upper back which do blanch with pressure Psychiatric: Normal mood and affect. Behavior is normal.  RELEVANT LABS AND IMAGING: CBC    Component Value Date/Time   WBC 9.0 06/04/2016 1656   RBC 4.34 06/04/2016 1656   HGB 13.7 06/04/2016 1656  HCT 40.6 06/04/2016 1656   PLT 315.0 06/04/2016 1656   MCV 93.6 06/04/2016 1656   MCH 31.4 09/20/2015 1309   MCHC 33.7 06/04/2016 1656   RDW 15.0 06/04/2016 1656   LYMPHSABS 2.2 06/04/2016 1656   MONOABS 0.6 06/04/2016 1656   EOSABS 0.2 06/04/2016 1656   BASOSABS 0.0 06/04/2016 1656    CMP     Component Value Date/Time   NA 131 (L) 06/04/2016 1656   K 4.3 06/04/2016 1656   CL 95 (L) 06/04/2016 1656   CO2 27 06/04/2016 1656   GLUCOSE 463 (H) 06/04/2016 1656   BUN 12 06/04/2016 1656   CREATININE 0.93 06/04/2016 1656   CALCIUM 9.5 06/04/2016 1656   PROT 8.1 06/04/2016 1656   ALBUMIN 4.0 06/04/2016 1656   AST 63 (H) 06/04/2016 1656   ALT 43 (H) 06/04/2016 1656   ALKPHOS 107 06/04/2016 1656   BILITOT 0.7 06/04/2016 1656   GFRNONAA >60 09/20/2015 1309   GFRAA >60 09/20/2015 1309     ASSESSMENT/PLAN: 51 year old female with a past medical history of GERD, hypertension, hypothyroidism, Graves' disease status post radioactive iodine therapy, who is seen in consultation at the request of Dr. Kenton Kingfisher to evaluate hepatomegaly, right upper quadrant abdominal pain, abnormal GI imaging/abdominal lymphadenopathy.  1. Hepatomegaly/right upper quadrant abdominal pain/muscle cramps with intermittent weakness/weight gain/abdominal adenopathy -- constellation of symptoms is confusing. I'm suspicious for a unifying diagnosis. I've recommended a battery of liver tests today to evaluate for hepatitis/liver inflammation but also other causes including genetic and autoimmune hepatitis. We'll check an aldolase given her muscle cramps and muscle weakness. I have recommended that we proceed with CT scan of the pelvis with IV contrast to evaluate the upper abdominal adenopathy and exclude a pelvic pathology source or adenopathy. I've also recommended upper endoscopy to evaluate her right upper quadrant pain. We discussed the risks, benefits and alternatives and she wishes to proceed.  Differential for hepatomegaly includes infectious (we are evaluating for viral hepatitis), toxic but she does not use alcohol, fatty liver alone though this is more than I would expect for nonalcoholic fatty liver disease, medications but likely not in her instance, autoimmune hepatitis, Wilson's disease, off 1 antitrypsin deficiency (need to exclude given her lung disease diagnosis), iron storage disease, infiltrative disease affecting the liver, and PBC or PSC.    We will await battery of lab tests ordered today and proceed with upper endoscopy. Further recommendations based on findings and results.  2. CRC screening -- recent colonoscopy with small polyps, presumed hyperplastic given the recommendation of her endoscopist to repeat the test in 10 years    Cc:Shirline Frees, Md [place] [address] [place], [place] [address]

## 2016-06-04 NOTE — Patient Instructions (Addendum)
You have been scheduled for a CT scan of the  pelvis at [facility] ([address]---this is in the same building as Press [facility]).   You are scheduled on 06/10/2016 at 4. Arrive at 2pm to your appointment time for registration and to start your water based contrast per your request. Please follow the written instructions below on the day of your exam:  WARNING: IF YOU ARE ALLERGIC TO IODINE/X-RAY DYE, PLEASE NOTIFY RADIOLOGY IMMEDIATELY AT [phone]! YOU WILL BE GIVEN A 13 HOUR PREMEDICATION PREP.  1) Do not eat or drink anything after 12pm (4 hours prior to your test)   You may take any medications as prescribed with a small amount of water except for the following: Metformin, Glucophage, Glucovance, Avandamet, Riomet, Fortamet, Actoplus Met, Janumet, Glumetza or Metaglip. The above medications must be held the day of the exam AND 48 hours after the exam.  The purpose of you drinking the oral contrast is to aid in the visualization of your intestinal tract. The contrast solution may cause some diarrhea. Before your exam is started, you will be given a small amount of fluid to drink. Depending on your individual set of symptoms, you may also receive an intravenous injection of x-ray contrast/dye. Plan on being at [facility] for 30 minutes or longer, depending on the type of exam you are having performed.  This test typically takes 30-45 minutes to complete.  If you have any questions regarding your exam or if you need to reschedule, you may call the CT department at [phone] between the hours of 8:00 am and 5:00 pm, Monday-Friday.  ________________________________________________________________________   Go to the basement for labs today  You have been scheduled for an endoscopy. Please follow written instructions given to you at your visit today. If you use inhalers (even only as needed), please bring them with you on the day of your procedure. Your  physician has requested that you go to www.startemmi.com and enter the access code given to you at your visit today. This web site gives a general overview about your procedure. However, you should still follow specific instructions given to you by our office regarding your preparation for the procedure.

## 2016-06-05 LAB — HEPATITIS A ANTIBODY, TOTAL: HEP A TOTAL AB: NONREACTIVE

## 2016-06-05 LAB — TISSUE TRANSGLUTAMINASE, IGA: TISSUE TRANSGLUTAMINASE AB, IGA: 1 U/mL (ref <4)

## 2016-06-05 LAB — HEPATITIS C ANTIBODY: HCV AB: NEGATIVE

## 2016-06-05 LAB — ANTI-NUCLEAR AB-TITER (ANA TITER): ANA Titer 1: 1:320 {titer} — ABNORMAL HIGH

## 2016-06-05 LAB — HEPATITIS B SURFACE ANTIBODY,QUALITATIVE: Hep B S Ab: NEGATIVE

## 2016-06-05 LAB — IGG: IGG (IMMUNOGLOBIN G), SERUM: 1330 mg/dL (ref 694–1618)

## 2016-06-05 LAB — HEPATITIS B SURFACE ANTIGEN: HEP B S AG: NEGATIVE

## 2016-06-05 LAB — ANTI-SMOOTH MUSCLE ANTIBODY, IGG: SMOOTH MUSCLE AB: 25 U — AB (ref <20)

## 2016-06-05 LAB — ANA: Anti Nuclear Antibody(ANA): POSITIVE — AB

## 2016-06-05 LAB — MITOCHONDRIAL ANTIBODIES: Mitochondrial M2 Ab, IgG: <=20 Units (ref <=20.0)

## 2016-06-06 ENCOUNTER — Other Ambulatory Visit: Payer: Self-pay

## 2016-06-06 DIAGNOSIS — R160 Hepatomegaly, not elsewhere classified: Secondary | ICD-10-CM

## 2016-06-06 LAB — ALPHA-1-ANTITRYPSIN: A1 ANTITRYPSIN SER: 230 mg/dL — AB (ref 83–199)

## 2016-06-06 LAB — CERULOPLASMIN: CERULOPLASMIN: 41 mg/dL (ref 18–53)

## 2016-06-07 LAB — ALDOLASE: Aldolase: 7.3 U/L (ref <=8.1)

## 2016-06-10 ENCOUNTER — Other Ambulatory Visit: Payer: BLUE CROSS/BLUE SHIELD

## 2016-06-10 ENCOUNTER — Ambulatory Visit (INDEPENDENT_AMBULATORY_CARE_PROVIDER_SITE_OTHER)
Admission: RE | Admit: 2016-06-10 | Discharge: 2016-06-10 | Disposition: A | Discharge status: Home / Self Care | Payer: BLUE CROSS/BLUE SHIELD | Source: Ambulatory Visit | Attending: Internal Medicine | Admitting: Internal Medicine

## 2016-06-10 DIAGNOSIS — R635 Abnormal weight gain: Secondary | ICD-10-CM

## 2016-06-10 DIAGNOSIS — R1011 Right upper quadrant pain: Secondary | ICD-10-CM | POA: Diagnosis not present

## 2016-06-10 DIAGNOSIS — R160 Hepatomegaly, not elsewhere classified: Secondary | ICD-10-CM | POA: Diagnosis not present

## 2016-06-10 DIAGNOSIS — R252 Cramp and spasm: Secondary | ICD-10-CM

## 2016-06-10 MED ORDER — IOPAMIDOL (ISOVUE-300) INJECTION 61%
80.0000 mL | Freq: Once | INTRAVENOUS | Status: AC | PRN
  Administered 2016-06-10: 80 mL via INTRAVENOUS

## 2016-06-14 ENCOUNTER — Other Ambulatory Visit: Payer: BLUE CROSS/BLUE SHIELD

## 2016-06-14 DIAGNOSIS — R160 Hepatomegaly, not elsewhere classified: Secondary | ICD-10-CM

## 2016-06-18 LAB — CORTISOL, URINE, 24 HOUR
Cortisol (Ur), Free: 57 mcg/24 h — ABNORMAL HIGH (ref 4.0–50.0)
RESULTS RECEIVED: 1.27 g/(24.h) (ref 0.63–2.50)

## 2016-06-19 ENCOUNTER — Encounter: Payer: Self-pay | Admitting: Internal Medicine

## 2016-06-19 ENCOUNTER — Other Ambulatory Visit: Payer: Self-pay

## 2016-06-19 DIAGNOSIS — E240 Pituitary-dependent Cushing's disease: Secondary | ICD-10-CM

## 2016-06-21 ENCOUNTER — Telehealth: Payer: Self-pay

## 2016-06-21 ENCOUNTER — Telehealth: Payer: Self-pay | Admitting: Internal Medicine

## 2016-06-21 NOTE — Telephone Encounter (Signed)
Spoke with pt regarding her disability papers. She states due to the pain in her muscles and cramping she is not able to work. Dr. Marlou Sa is the physician that placed her out of work. Discussed with her that he should be the one to fill out the paperwork. She states he has a set to fill out and she will follow-up with him regarding the papers.

## 2016-06-21 NOTE — Telephone Encounter (Signed)
Spoke with pt and she is aware. Appt cancelled.

## 2016-06-21 NOTE — Telephone Encounter (Signed)
Does pt still need to have EGD done? Please advise.

## 2016-06-21 NOTE — Telephone Encounter (Signed)
This can be put on hold for now pending endocrine eval

## 2016-06-25 ENCOUNTER — Encounter: Payer: Self-pay | Admitting: Neurology

## 2016-06-25 ENCOUNTER — Ambulatory Visit (INDEPENDENT_AMBULATORY_CARE_PROVIDER_SITE_OTHER): Payer: BLUE CROSS/BLUE SHIELD | Admitting: Neurology

## 2016-06-25 VITALS — BP 140/93 | HR 92 | Ht 62.0 in | Wt 183.4 lb

## 2016-06-25 DIAGNOSIS — M6289 Other specified disorders of muscle: Secondary | ICD-10-CM

## 2016-06-25 DIAGNOSIS — R252 Cramp and spasm: Secondary | ICD-10-CM

## 2016-06-25 MED ORDER — GABAPENTIN 300 MG PO CAPS: 300.0000 mg | ORAL_CAPSULE | Freq: Three times a day (TID) | ORAL | 11 refills | Status: DC | PRN

## 2016-06-25 NOTE — Progress Notes (Signed)
[facility]    Provider:  Dr Jaynee Eagles Referring Provider: Shirline Frees, MD Primary Care Physician:  Shirline Frees, MD  CC:  Muscle cramps  Interval history 06/25/2016:   She is following up with endocrinology for Cushing's disorder. She has had no seizures and she stopped the medication on her own in January. I recommend continuing on the [place] due to abnormal EEG but she prefers not to, I discussed risks of repeat seizures. Muscle cramping started several months ago in the setting of severely elevated glucose (500+). Still having elevated glucose daily 200-300 daily. She is having cramping all over her body. Even when she yawns her neck muscles carmp up, her side cramps up, she gets cramps on the top of her feet. Her fingers lock up and cramp up. She is having weakness, arms get very tired. She is extremely fatigued. She has to rest frequently, can't stand for long, she has difficulty going up stairs.   Interval history 07/13/2015: Discussed abnormal 3-day EEG(Epileptiform discharges seen in the left anterior temporal region (T3).) and that she should stay on AEDs.  Husband with her.  EEG abnormal and taking high dose of Wellbutrin likely caused a seizure in this patient with a lowered threshold for seizures. She has episodes of anxiety and she is worried about seizures, she has episodes she doesn;t feel right and worries this is a seizure aura vs anxiety. She is having side effects to Depakote, swelling, weight gain. Discussed other AED options, will start Lamictal as this is great for seizure prevention and for mood.  HPI: Emma Glenn is a 51 y.o. female here as a referral from Dr. Kenton Kingfisher for seizure. PMHx HTN, thyroid disease. Never had a seizure, no family history of seizure. There was a mistake and she was prescribed 966m of Wellbutrin a day. She was titrating up. She was on 452mfor several months, her new prescription was 30010mhree times a day. When she  started taking those she had several episodes of confusion, not knowing the family, she kept repeating herslef. Three of those then a seizure. 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 The next thing she knew she saw EMS and her arm hurt. Daughter also here and provides information and says patient was digging with no sand and then she fell and hit her shoulder full convulsions for 3-4 minutes with confusion afterwards. She was placed on Depakote in the ED. She was taking phentermine along with the Wellbutrin. No current complaints and patient feels fine and back to baseline since discontinuing both the Wellbutrin and phentermine. No focal neurologic deficits.   Reviewed notes, labs and imaging from outside physicians, which showed:  EEG 04/29/2015: Unremarkable awake and drowsy routine inpatient EEG.   MRI of the brain: personally reviewed images, normal MRI of the brain.  Review of Systems: Patient complains of symptoms per HPI as well as the following symptoms: anxiety, no SOB, no CP. Pertinent negatives per HPI. All others negative.   TSH 4.45, glucose 463   Review of Systems: Patient complains of symptoms per HPI as well as the following symptoms: no CP, no SOB, muscle cramps and pain. Pertinent negatives and positives per HPI. All others negative.   Social History   Social History  . Marital status: Married    Spouse name: RanTommie Raymond Number of children: 1  . Years of education: 12 73Occupational History  . Not on  file.   Social History Main Topics  . Smoking status: Former Smoker    Packs/day: 1.00    Types: Cigarettes    Quit date: 02/14/2015  . Smokeless tobacco: Never Used  . Alcohol use 0.0 oz/week     Comment: rare  . Drug use: No     Comment: Hx of marijuana use  . Sexual activity: Yes    Birth control/ protection: Surgical   Other Topics Concern  . Not on file   Social History Narrative   Lives with  husband and sister   Caffeine use: Tea/coffee daily       Family History  Problem Relation Age of Onset  . Hypertension Mother   . Heart disease Mother   . Diabetes Father   . Heart disease Father   . Heart disease Brother   . Breast cancer Maternal Grandmother   . Seizures Neg Hx     Past Medical History:  Diagnosis Date  . CIN I (cervical intraepithelial neoplasia I)   . GERD (gastroesophageal reflux disease)   . Hepatic steatosis   . Hypertension   . Hypothyroidism   . Labial cyst    Inclusion cyst  . Seizures ([place])    last seizure 3/17? over dose wellbutrin  . Thyroid disease    Graves dis-Radioactive Iodine    Past Surgical History:  Procedure Laterality Date  . CERVICAL BIOPSY  W/ LOOP ELECTRODE EXCISION     Excision labial inclusion cyst  . CESAREAN SECTION    . COLPOSCOPY    . HAND SURGERY    . SHOULDER ARTHROSCOPY WITH OPEN ROTATOR CUFF REPAIR AND DISTAL CLAVICLE ACROMINECTOMY Left 06/13/2015   Procedure: LEFT SHOULDER ARTHROSCOPY, DEBRIDEMENT, OPEN ROTATOR CUFF REPAIR, CORACOID FRACTURE FIXATION, BICEPS TENODESIS;  Surgeon: Meredith Pel, MD;  Location: [place];  Service: Orthopedics;  Laterality: Left;  . SHOULDER CLOSED REDUCTION Left 09/26/2015   Procedure: CLOSED MANIPULATION SHOULDER UNDER ANESTHESIA;  Surgeon: Meredith Pel, MD;  Location: [place];  Service: Orthopedics;  Laterality: Left;  . TUBAL LIGATION      Current Outpatient Prescriptions  Medication Sig Dispense Refill  . Glycopyrrolate-Formoterol (BEVESPI AEROSPHERE) 9-4.8 MCG/ACT AERO Inhale 2 puffs into the lungs 2 (two) times daily. 10.7 g 6  . levothyroxine (SYNTHROID, LEVOTHROID) 175 MCG tablet Take 175 mcg by mouth daily before breakfast.    . losartan (COZAAR) 50 MG tablet Take 50 mg by mouth daily.    Marland Kitchen omeprazole (PRILOSEC) 40 MG capsule Take 40 mg by mouth daily.     No current facility-administered medications for this visit.     Allergies as of 06/25/2016 - Review Complete  06/10/2016  Allergen Reaction Noted  . Doxycycline Other (See Comments) 06/06/2015  . Nitrofurantoin monohyd macro Other (See Comments) 02/19/2011  . Septra [sulfamethoxazole-trimethoprim] Other (See Comments) 06/06/2015  . Lamictal [lamotrigine] Rash 09/15/2015    Vitals: There were no vitals taken for this visit. Last Weight:  Wt Readings from Last 1 Encounters:  06/04/16 185 lb (83.9 kg)   Last Height:   Ht Readings from Last 1 Encounters:  06/04/16 5' 1.5" (1.562 m)   Physical exam: Exam: Gen: NAD, conversant, well nourised, obese, well groomed                     Eyes: Conjunctivae clear without exudates or hemorrhage  Neuro: Detailed Neurologic Exam  Speech:    Speech is normal; fluent and spontaneous with normal comprehension.  Cognition:  The patient is oriented to person, place, and time;     Cranial Nerves:    The pupils are equal, round, and reactive to light. Visual fields are full to finger confrontation. Extraocular movements are intact. Trigeminal sensation is intact and the muscles of mastication are normal. The face is symmetric. The palate elevates in the midline. Hearing intact. Voice is normal. Shoulder shrug is normal. The tongue has normal motion without fasciculations.   Gait:    Not ataxic  Motor Observation:    No asymmetry, no atrophy, and no involuntary movements noted. Tone:    Normal muscle tone.    Posture:    Posture is normal. normal erect    Strength: Mild proximal weakness otherwise strength is V/V in the upper and lower limbs.      Sensation: intact to LT     Reflex Exam:  DTR's:    Deep tendon reflexes in the upper and lower extremities are symmetrical bilaterally.   Toes:    The toes are equivocal bilaterally.   Clonus:    Clonus is absent.       Assessment/Plan:  This is a 51 year old patient here for muscle cramping and proximal weakness in the setting of elevated glucose, abnormal liver function testing and  hepatomegaly, abnormal cortisol levels being currently evaluated. Exam shows proximal weakness.  Emg/ncs right arm and right leg to evaluate for myopathy or myositis. Labs today including phosphorus, CK, vitamin D, BMP and magnesium We'll start gabapentin, discussed side effects as per patient instructions.  Sarina Ill, MD  [facility] [address] [place], [place] [address]  Phone [phone] Fax [phone]  A total of 35 minutes was spent face-to-face with this patient. Over half this time was spent on counseling patient on the muscle weakness and cramps diagnosis and different diagnostic and therapeutic options available.

## 2016-06-25 NOTE — Patient Instructions (Addendum)
Remember to drink plenty of fluid, eat healthy meals and do not skip any meals. Try to eat protein with a every meal and eat a healthy snack such as fruit or nuts in between meals. Try to keep a regular sleep-wake schedule and try to exercise daily, particularly in the form of walking, 20-30 minutes a day, if you can.   As far as your medications are concerned, I would like to suggest: Gabapentin   As far as diagnostic testing: emg/ncs  I would like to see you back for emg/ncs, sooner if we need to. Please call us with any interim questions, concerns, problems, updates or refill requests.   Our phone number is [phone]. We also have an after hours call service for urgent matters and there is a physician on-call for urgent questions. For any emergencies you know to call 911 or go to the nearest emergency room  Gabapentin capsules or tablets What is this medicine? GABAPENTIN (GA ba pen tin) is used to control partial seizures in adults with epilepsy. It is also used to treat certain types of nerve pain. This medicine may be used for other purposes; ask your health care provider or pharmacist if you have questions. COMMON BRAND NAME(S): Active-PAC with Gabapentin, Gabarone, Neurontin What should I tell my health care provider before I take this medicine? They need to know if you have any of these conditions: -kidney disease -suicidal thoughts, plans, or attempt; a previous suicide attempt by you or a family member -an unusual or allergic reaction to gabapentin, other medicines, foods, dyes, or preservatives -pregnant or trying to get pregnant -breast-feeding How should I use this medicine? Take this medicine by mouth with a glass of water. Follow the directions on the prescription label. You can take it with or without food. If it upsets your stomach, take it with food.Take your medicine at regular intervals. Do not take it more often than directed. Do not stop taking except on your doctor's  advice. If you are directed to break the 600 or 800 mg tablets in half as part of your dose, the extra half tablet should be used for the next dose. If you have not used the extra half tablet within 28 days, it should be thrown away. A special MedGuide will be given to you by the pharmacist with each prescription and refill. Be sure to read this information carefully each time. Talk to your pediatrician regarding the use of this medicine in children. Special care may be needed. Overdosage: If you think you have taken too much of this medicine contact a poison control center or emergency room at once. NOTE: This medicine is only for you. Do not share this medicine with others. What if I miss a dose? If you miss a dose, take it as soon as you can. If it is almost time for your next dose, take only that dose. Do not take double or extra doses. What may interact with this medicine? Do not take this medicine with any of the following medications: -other gabapentin products This medicine may also interact with the following medications: -alcohol -antacids -antihistamines for allergy, cough and cold -certain medicines for anxiety or sleep -certain medicines for depression or psychotic disturbances -homatropine; hydrocodone -naproxen -narcotic medicines (opiates) for pain -phenothiazines like chlorpromazine, mesoridazine, prochlorperazine, thioridazine This list may not describe all possible interactions. Give your health care provider a list of all the medicines, herbs, non-prescription drugs, or dietary supplements you use. Also tell them if you smoke,  drink alcohol, or use illegal drugs. Some items may interact with your medicine. What should I watch for while using this medicine? Visit your doctor or health care professional for regular checks on your progress. You may want to keep a record at home of how you feel your condition is responding to treatment. You may want to share this information  with your doctor or health care professional at each visit. You should contact your doctor or health care professional if your seizures get worse or if you have any new types of seizures. Do not stop taking this medicine or any of your seizure medicines unless instructed by your doctor or health care professional. Stopping your medicine suddenly can increase your seizures or their severity. Wear a medical identification bracelet or chain if you are taking this medicine for seizures, and carry a card that lists all your medications. You may get drowsy, dizzy, or have blurred vision. Do not drive, use machinery, or do anything that needs mental alertness until you know how this medicine affects you. To reduce dizzy or fainting spells, do not sit or stand up quickly, especially if you are an older patient. Alcohol can increase drowsiness and dizziness. Avoid alcoholic drinks. Your mouth may get dry. Chewing sugarless gum or sucking hard candy, and drinking plenty of water will help. The use of this medicine may increase the chance of suicidal thoughts or actions. Pay special attention to how you are responding while on this medicine. Any worsening of mood, or thoughts of suicide or dying should be reported to your health care professional right away. Women who become pregnant while using this medicine may enroll in the [place] Pregnancy Registry by calling [id number]. This registry collects information about the safety of antiepileptic drug use during pregnancy. What side effects may I notice from receiving this medicine? Side effects that you should report to your doctor or health care professional as soon as possible: -allergic reactions like skin rash, itching or hives, swelling of the face, lips, or tongue -worsening of mood, thoughts or actions of suicide or dying Side effects that usually do not require medical attention (report to your doctor or health care professional if  they continue or are bothersome): -constipation -difficulty walking or controlling muscle movements -dizziness -nausea -slurred speech -tiredness -tremors -weight gain This list may not describe all possible side effects. Call your doctor for medical advice about side effects. You may report side effects to FDA at 1-800-FDA-1088. Where should I keep my medicine? Keep out of reach of children. This medicine may cause accidental overdose and death if it taken by other adults, children, or pets. Mix any unused medicine with a substance like cat litter or coffee grounds. Then throw the medicine away in a sealed container like a sealed bag or a coffee can with a lid. Do not use the medicine after the expiration date. Store at room temperature between 15 and 30 degrees C (59 and 86 degrees F). NOTE: This sheet is a summary. It may not cover all possible information. If you have questions about this medicine, talk to your doctor, pharmacist, or health care provider.  2018 Elsevier/Gold Standard (2013-03-26 15:26:50)

## 2016-06-26 ENCOUNTER — Telehealth: Payer: Self-pay | Admitting: *Deleted

## 2016-06-26 LAB — PHOSPHORUS: Phosphorus: 3.4 mg/dL (ref 2.5–4.5)

## 2016-06-26 LAB — BASIC METABOLIC PANEL WITH GFR
BUN/Creatinine Ratio: 19 (ref 9–23)
BUN: 14 mg/dL (ref 6–24)
CO2: 24 mmol/L (ref 18–29)
Calcium: 9.7 mg/dL (ref 8.7–10.2)
Chloride: 94 mmol/L — ABNORMAL LOW (ref 96–106)
Creatinine, Ser: 0.72 mg/dL (ref 0.57–1.00)
GFR calc Af Amer: 111 mL/min/1.73
GFR calc non Af Amer: 97 mL/min/1.73
Glucose: 396 mg/dL — ABNORMAL HIGH (ref 65–99)
Potassium: 5 mmol/L (ref 3.5–5.2)
Sodium: 137 mmol/L (ref 134–144)

## 2016-06-26 LAB — MAGNESIUM: Magnesium: 1.8 mg/dL (ref 1.6–2.3)

## 2016-06-26 LAB — VITAMIN D 25 HYDROXY (VIT D DEFICIENCY, FRACTURES): Vit D, 25-Hydroxy: 35.1 ng/mL (ref 30.0–100.0)

## 2016-06-26 LAB — CK: Total CK: 26 U/L (ref 24–173)

## 2016-06-26 NOTE — Telephone Encounter (Signed)
Spoke with patient and informed her that her labs are normal except the glucose is close to 400. Advised her that Dr Jaynee Eagles thinks when she controls the glucose the cramping will improve. Reminded patient of her emg/ncs tomorrow. She stated she would call her PCP about the glucose; he manages her Metformin. She verbalized understanding, appreciation of call.

## 2016-06-27 ENCOUNTER — Ambulatory Visit (INDEPENDENT_AMBULATORY_CARE_PROVIDER_SITE_OTHER): Payer: BLUE CROSS/BLUE SHIELD | Admitting: Neurology

## 2016-06-27 ENCOUNTER — Ambulatory Visit (INDEPENDENT_AMBULATORY_CARE_PROVIDER_SITE_OTHER): Payer: Self-pay | Admitting: Neurology

## 2016-06-27 DIAGNOSIS — Z0289 Encounter for other administrative examinations: Secondary | ICD-10-CM

## 2016-06-27 DIAGNOSIS — R252 Cramp and spasm: Secondary | ICD-10-CM

## 2016-06-27 NOTE — Progress Notes (Signed)
Full Name: Emma Glenn Gender: Female MRN #: [id number] Date of Birth: [birth year]    Visit Date: 06/27/2016 09:07 Age: 51 Years 7 Months Old Examining Physician: Sarina Ill, MD  Referring Physician: Marlou Sa, MD  History: Muscle cramps and weakness in the setting of uncontrolled diabetes  Summary: EMG/NCS was performed o the bilateral upper and right lower extremities  All nerves and muscles (as detailed in the tables below) were normal    Conclusion: This is a normal study   CC: Dr. Delmer Islam M.D.  [facility] [place], [place] [address] Tel: [phone] Fax: [phone]        [facility]    Nerve / Sites Rec. Site Latency Ref. Amplitude Ref. Rel Amp Segments Distance Velocity Ref. Area    ms ms mV mV %  cm m/s m/s mVms  R Median - APB     Wrist APB 5.3 ?4.4 4.7 ?4.0 100 Wrist - APB 7   14.7     Upper arm APB 9.5  4.7  100 Upper arm - Wrist 21 49 ?49 15.2  L Median - APB     Wrist APB 4.1 ?4.4 6.8 ?4.0 100 Wrist - APB 7   23.7     Upper arm APB 8.2  6.6  97.6 Upper arm - Wrist 21 52 ?49 22.1  R Ulnar - ADM     Wrist ADM 2.2 ?3.3 10.6 ?6.0 100 Wrist - ADM 7   27.8     B.Elbow ADM 5.7  10.3  96.4 B.Elbow - Wrist 18 52 ?49 27.3     A.Elbow ADM 7.3  10.0  97.1 A.Elbow - B.Elbow 9 54 ?49 26.6         A.Elbow - Wrist      R Peroneal - EDB     Ankle EDB 4.4 ?6.5 11.7 ?2.0 100 Ankle - EDB 9   26.7     Fib head EDB 10.7  10.6  90.7 Fib head - Ankle 28 44 ?44 25.2     Pop fossa EDB 12.8  10.3  97.1 Pop fossa - Fib head 10 49 ?44 25.1         Pop fossa - Ankle      R Tibial - AH     Ankle AH 3.8 ?5.8 12.7 ?4.0 100 Ankle - AH 9   30.3     Pop fossa AH 12.1  10.0  79 Pop fossa - Ankle 34 41 ?41 30.6               SNC    Nerve / Sites Rec. Site Peak Lat Ref.  Amp Ref. Segments Distance    ms ms V V  cm  R Sural - Ankle (Calf)     Calf Ankle 3.23 ?4.40 16 ?6 Calf - Ankle 14  R Superficial peroneal - Ankle     Lat leg Ankle  4.06 ?4.40 11 ?6 Lat leg - Ankle 14  R Median - Orthodromic (Dig II, Mid palm)     Dig II Wrist 3.80 ?3.40 12 ?10 Dig II - Wrist 13  L Median - Orthodromic (Dig II, Mid palm)     Dig II Wrist 3.13 ?3.40 16 ?10 Dig II - Wrist 13  R Ulnar - Orthodromic, (Dig V, Mid palm)     Dig V Wrist 2.34 ?3.10 14 ?5 Dig V - Wrist 11  F  Wave    Nerve F Lat Ref.   ms ms  R Ulnar - ADM 25.6 ?32.0  R Tibial - AH 48.4 ?56.0         EMG full       EMG Summary Table    Spontaneous MUAP Recruitment  Muscle IA Fib PSW Fasc Other Amp Dur. Poly Pattern  R. Deltoid Normal None None None _______ Normal Normal Normal Normal  R. Triceps brachii Normal None None None _______ Normal Normal Normal Normal  R. Pronator quadratus Normal None None None _______ Normal Normal Normal Normal  R. First dorsal interosseous Normal None None None _______ Normal Normal Normal Normal  R. Opponens pollicis Normal None None None _______ Normal Normal Normal Normal  R. Vastus medialis Normal None None None _______ Normal Normal Normal Normal  R. Iliopsoas Normal None None None _______ Normal Normal Normal Normal  R. Gastrocnemius (Medial head) Normal None None None _______ Normal Normal Normal Normal  R. Tibialis anterior Normal None None None _______ Normal Normal Normal Normal  R. Extensor hallucis longus Normal None None None _______ Normal Normal Normal Normal

## 2016-06-30 NOTE — Progress Notes (Signed)
See procedure note.

## 2016-07-01 ENCOUNTER — Encounter: Payer: Self-pay | Admitting: Internal Medicine

## 2016-07-01 ENCOUNTER — Ambulatory Visit (INDEPENDENT_AMBULATORY_CARE_PROVIDER_SITE_OTHER): Payer: BLUE CROSS/BLUE SHIELD | Admitting: Internal Medicine

## 2016-07-01 ENCOUNTER — Telehealth (INDEPENDENT_AMBULATORY_CARE_PROVIDER_SITE_OTHER): Payer: Self-pay | Admitting: Orthopedic Surgery

## 2016-07-01 ENCOUNTER — Ambulatory Visit: Payer: BLUE CROSS/BLUE SHIELD | Admitting: Internal Medicine

## 2016-07-01 ENCOUNTER — Other Ambulatory Visit: Payer: BLUE CROSS/BLUE SHIELD

## 2016-07-01 VITALS — BP 122/82 | HR 93 | Ht 61.5 in | Wt 183.0 lb

## 2016-07-01 DIAGNOSIS — E1165 Type 2 diabetes mellitus with hyperglycemia: Secondary | ICD-10-CM | POA: Insufficient documentation

## 2016-07-01 DIAGNOSIS — R8299 Other abnormal findings in urine: Secondary | ICD-10-CM | POA: Diagnosis not present

## 2016-07-01 DIAGNOSIS — R82998 Other abnormal findings in urine: Secondary | ICD-10-CM

## 2016-07-01 MED ORDER — DEXAMETHASONE 1 MG PO TABS: ORAL_TABLET | ORAL | 0 refills | Status: DC

## 2016-07-01 MED ORDER — GLUCOSE BLOOD VI STRP: ORAL_STRIP | 3 refills | Status: AC

## 2016-07-01 NOTE — Procedures (Signed)
Full Name: Katlyne Nishida Gender: Female MRN #: [id number] Date of Birth: May 06, 1965    Visit Date: 06/27/2016 09:07 Age: 51 Years 64 Months Old Examining Physician: Sarina Ill, MD  Referring Physician: Marlou Sa, MD  History: Muscle cramps and weakness in the setting of uncontrolled diabetes  Summary: EMG/NCS was performed on the bilateral upper and right lower extremities  All nerves and muscles (as detailed in the tables below) were normal    Conclusion: This is a normal study   CC: Dr. Delmer Islam M.D.  [facility] [place], [place] [address] Tel: [phone] Fax: [phone]        [facility]    Nerve / Sites Rec. Site Latency Ref. Amplitude Ref. Rel Amp Segments Distance Velocity Ref. Area    ms ms mV mV %  cm m/s m/s mVms  R Median - APB     Wrist APB 5.3 ?4.4 4.7 ?4.0 100 Wrist - APB 7   14.7     Upper arm APB 9.5  4.7  100 Upper arm - Wrist 21 49 ?49 15.2  L Median - APB     Wrist APB 4.1 ?4.4 6.8 ?4.0 100 Wrist - APB 7   23.7     Upper arm APB 8.2  6.6  97.6 Upper arm - Wrist 21 52 ?49 22.1  R Ulnar - ADM     Wrist ADM 2.2 ?3.3 10.6 ?6.0 100 Wrist - ADM 7   27.8     B.Elbow ADM 5.7  10.3  96.4 B.Elbow - Wrist 18 52 ?49 27.3     A.Elbow ADM 7.3  10.0  97.1 A.Elbow - B.Elbow 9 54 ?49 26.6         A.Elbow - Wrist      R Peroneal - EDB     Ankle EDB 4.4 ?6.5 11.7 ?2.0 100 Ankle - EDB 9   26.7     Fib head EDB 10.7  10.6  90.7 Fib head - Ankle 28 44 ?44 25.2     Pop fossa EDB 12.8  10.3  97.1 Pop fossa - Fib head 10 49 ?44 25.1         Pop fossa - Ankle      R Tibial - AH     Ankle AH 3.8 ?5.8 12.7 ?4.0 100 Ankle - AH 9   30.3     Pop fossa AH 12.1  10.0  79 Pop fossa - Ankle 34 41 ?41 30.6               SNC    Nerve / Sites Rec. Site Peak Lat Ref.  Amp Ref. Segments Distance    ms ms V V  cm  R Sural - Ankle (Calf)     Calf Ankle 3.23 ?4.40 16 ?6 Calf - Ankle 14  R Superficial peroneal - Ankle     Lat leg Ankle  4.06 ?4.40 11 ?6 Lat leg - Ankle 14  R Median - Orthodromic (Dig II, Mid palm)     Dig II Wrist 3.80 ?3.40 12 ?10 Dig II - Wrist 13  L Median - Orthodromic (Dig II, Mid palm)     Dig II Wrist 3.13 ?3.40 16 ?10 Dig II - Wrist 13  R Ulnar - Orthodromic, (Dig V, Mid palm)     Dig V Wrist 2.34 ?3.10 14 ?5 Dig V - Wrist 11  F  Wave    Nerve F Lat Ref.   ms ms  R Ulnar - ADM 25.6 ?32.0  R Tibial - AH 48.4 ?56.0         EMG full       EMG Summary Table    Spontaneous MUAP Recruitment  Muscle IA Fib PSW Fasc Other Amp Dur. Poly Pattern  R. Deltoid Normal None None None _______ Normal Normal Normal Normal  R. Triceps brachii Normal None None None _______ Normal Normal Normal Normal  R. Pronator quadratus Normal None None None _______ Normal Normal Normal Normal  R. First dorsal interosseous Normal None None None _______ Normal Normal Normal Normal  R. Opponens pollicis Normal None None None _______ Normal Normal Normal Normal  R. Vastus medialis Normal None None None _______ Normal Normal Normal Normal  R. Iliopsoas Normal None None None _______ Normal Normal Normal Normal  R. Gastrocnemius (Medial head) Normal None None None _______ Normal Normal Normal Normal  R. Tibialis anterior Normal None None None _______ Normal Normal Normal Normal  R. Extensor hallucis longus Normal None None None _______ Normal Normal Normal Normal

## 2016-07-01 NOTE — Patient Instructions (Addendum)
Please increase Metformin to 1000 mg 2x a day with meals.  Take Dexamethasone 11 pm tonight and come back for labs at 8 am.  Send me your sugars in 2 weeks.  Come back for an appt in 1.5-2 months.  PATIENT INSTRUCTIONS FOR TYPE 2 DIABETES:  **Please join MyChart!** - see attached instructions about how to join if you have not done so already.  DIET AND EXERCISE Diet and exercise is an important part of diabetic treatment.  We recommended aerobic exercise in the form of brisk walking (working between 40-60% of maximal aerobic capacity, similar to brisk walking) for 150 minutes per week (such as 30 minutes five days per week) along with 3 times per week performing 'resistance' training (using various gauge rubber tubes with handles) 5-10 exercises involving the major muscle groups (upper body, lower body and core) performing 10-15 repetitions (or near fatigue) each exercise. Start at half the above goal but build slowly to reach the above goals. If limited by weight, joint pain, or disability, we recommend daily walking in a swimming pool with water up to waist to reduce pressure from joints while allow for adequate exercise.    BLOOD GLUCOSES Monitoring your blood glucoses is important for continued management of your diabetes. Please check your blood glucoses 2-4 times a day: fasting, before meals and at bedtime (you can rotate these measurements - e.g. one day check before the 3 meals, the next day check before 2 of the meals and before bedtime, etc.).   HYPOGLYCEMIA (low blood sugar) Hypoglycemia is usually a reaction to not eating, exercising, or taking too much insulin/ other diabetes drugs.  Symptoms include tremors, sweating, hunger, confusion, headache, etc. Treat IMMEDIATELY with 15 grams of Carbs: . 4 glucose tablets .  cup regular juice/soda . 2 tablespoons raisins . 4 teaspoons sugar . 1 tablespoon honey Recheck blood glucose in 15 mins and repeat above if still  symptomatic/blood glucose <100.  RECOMMENDATIONS TO REDUCE YOUR RISK OF DIABETIC COMPLICATIONS: * Take your prescribed MEDICATION(S) * Follow a DIABETIC diet: Complex carbs, fiber rich foods, (monounsaturated and polyunsaturated) fats * AVOID saturated/trans fats, high fat foods, >2,300 mg salt per day. * EXERCISE at least 5 times a week for 30 minutes or preferably daily.  * DO NOT SMOKE OR DRINK more than 1 drink a day. * Check your FEET every day. Do not wear tightfitting shoes. Contact us if you develop an ulcer * See your EYE doctor once a year or more if needed * Get a FLU shot once a year * Get a PNEUMONIA vaccine once before and once after age 56 years  GOALS:  * Your Hemoglobin A1c of <7%  * fasting sugars need to be <130 * after meals sugars need to be <180 (2h after you start eating) * Your Systolic BP should be 010 or lower  * Your Diastolic BP should be 80 or lower  * Your HDL (Good Cholesterol) should be 40 or higher  * Your LDL (Bad Cholesterol) should be 100 or lower. * Your Triglycerides should be 150 or lower  * Your Urine microalbumin (kidney function) should be <30 * Your Body Mass Index should be 25 or lower    Please consider the following ways to cut down carbs and fat and increase fiber and micronutrients in your diet: - substitute whole grain for white bread or pasta - substitute brown rice for white rice - substitute 90-calorie flat bread pieces for slices of bread when possible -  substitute sweet potatoes or yams for white potatoes - substitute humus for margarine - substitute tofu for cheese when possible - substitute almond or rice milk for regular milk (would not drink soy milk daily due to concern for soy estrogen influence on breast cancer risk) - substitute dark chocolate for other sweets when possible - substitute water - can add lemon or orange slices for taste - for diet sodas (artificial sweeteners will trick your body that you can eat sweets  without getting calories and will lead you to overeating and weight gain in the long run) - do not skip breakfast or other meals (this will slow down the metabolism and will result in more weight gain over time)  - can try smoothies made from fruit and almond/rice milk in am instead of regular breakfast - can also try old-fashioned (not instant) oatmeal made with almond/rice milk in am - order the dressing on the side when eating salad at a restaurant (pour less than half of the dressing on the salad) - eat as little meat as possible - can try juicing, but should not forget that juicing will get rid of the fiber, so would alternate with eating raw veg./fruits or drinking smoothies - use as little oil as possible, even when using olive oil - can dress a salad with a mix of balsamic vinegar and lemon juice, for e.g. - use agave nectar, stevia sugar, or regular sugar rather than artificial sweateners - steam or broil/roast veggies  - snack on veggies/fruit/nuts (unsalted, preferably) when possible, rather than processed foods - reduce or eliminate aspartame in diet (it is in diet sodas, chewing gum, etc) Read the labels!  Try to read Dr. Janene Harvey book: "Program for Reversing Diabetes" for other ideas for healthy eating.

## 2016-07-01 NOTE — Progress Notes (Signed)
Patient ID: Emma Glenn, female   DOB: 1965/04/16, 51 y.o.   MRN: [id number]   HPI: Emma Glenn is a 51 y.o.-year-old female, referred by her GI Dr, Dr. Hilarie Fredrickson, for evaluation and tx of high urinary cortisol.  She started to have severe generalized mm cramps in 03/2016.   Around that time >> RUQ pain >> HIDA scan >> GB w/o clear stones, but with sludge. She had an abd. CT scan: increased size of her liver + liver steatosis.  She also has recent hyperglycemia, which sugars 463, 396 >> started Metformin.  Still building up the dose. She did not check sugars after starting metformin... An HbA1c obtained last year was normal: Lab Results  Component Value Date   HGBA1C 5.6 04/28/2015   She mentions that she gained approximately 50 pounds in the last 6 months, however, per review of the chart, she gained this weight since 04/2015. She had a large increase in weight between 07/2015 in 09/2015 and she was actually able to lose almost 10 pounds in the last 4 months.  Due to the above problems, Dr. Hilarie Fredrickson checked a 24-hour urine free cortisol, which returned slightly elevated: Component     Latest Ref Rng & Units 06/04/2016 06/14/2016  Cortisol (Ur), Free     4.0 - 50.0 mcg/24 h  57.0 (H)  Results received     0.63 - 2.50 g/24 h  1.27  Cortisol, Plasma     Ug/dL (collected at 5 pm) 6.8    She is not on OCPs.  A CT abdomen (05/2016) showed normal adrenals.  A brain MRI (04/2015) showed normal pituitary gland.  Patient c/o: - weight gain (see above) - fatigue - mm pain and proximal mm weakness - liver pain  - mental fog - face roundness - abdominal obesity - Generalized mild hair loss - Spider veins on upper back  She also has blurry vision and nocturia.   - + h/o hypothyroidism. On LT4 175 mcg daily. 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  Father and brother have diabetes.  ROS: Constitutional: + see HPI, + poor sleep Eyes: no blurry vision, no xerophthalmia ENT: no sore throat, no nodules palpated in throat, no dysphagia/odynophagia Cardiovascular: no CP/ + SOB/ no palpitations/leg swelling Respiratory: no cough/ + SOB Gastrointestinal: no N/V/ + D/ no C/+  acid reflux Musculoskeletal: +muscle/no joint aches Skin: no rashes, + hair loss Neurological: no tremors/numbness/tingling/dizziness Psychiatric: no depression/anxiety +  low libido   Past Medical History:  Diagnosis Date  . CIN I (cervical intraepithelial neoplasia I)   . GERD (gastroesophageal reflux disease)   . Hepatic steatosis   . Hypertension   . Hypothyroidism   . Labial cyst    Inclusion cyst  . Seizures ([place])    last seizure 3/17? over dose wellbutrin  . Thyroid disease    Graves dis-Radioactive Iodine   Past Surgical History:  Procedure Laterality Date  . CERVICAL BIOPSY  W/ LOOP ELECTRODE EXCISION     Excision labial inclusion cyst  . CESAREAN SECTION    . COLPOSCOPY    . HAND SURGERY    . SHOULDER ARTHROSCOPY WITH OPEN ROTATOR CUFF REPAIR AND DISTAL CLAVICLE ACROMINECTOMY Left 06/13/2015  Procedure: LEFT SHOULDER ARTHROSCOPY, DEBRIDEMENT, OPEN ROTATOR CUFF REPAIR, CORACOID FRACTURE FIXATION, BICEPS TENODESIS;  Surgeon: Meredith Pel, MD;  Location: [facility];  Service: Orthopedics;  Laterality: Left;  . SHOULDER CLOSED REDUCTION Left 09/26/2015   Procedure: CLOSED MANIPULATION SHOULDER UNDER ANESTHESIA;  Surgeon: Meredith Pel, MD;  Location: [facility];  Service: Orthopedics;  Laterality: Left;  . TUBAL LIGATION     Social History   Social History  . Marital status: Married    Spouse name: Tommie Raymond  . Number of children: 1  . Years of education: 48   Occupational History  . Material handler    Social History Main Topics  .  Smoking status: Former Smoker    Packs/day: 1.00    Types: Cigarettes    Quit date: 02/14/2015  . Smokeless tobacco: Never Used  . Alcohol use 0.0 oz/week     Comment: rare  . Drug use: No     Comment: Hx of marijuana use  . Sexual activity: Yes    Birth control/ protection: Surgical   Social History Narrative   Lives with husband and sister   Caffeine use: Tea/coffee daily      Current Outpatient Prescriptions on File Prior to Visit  Medication Sig Dispense Refill  . gabapentin (NEURONTIN) 300 MG capsule Take 1 capsule (300 mg total) by mouth 3 (three) times daily as needed. (Patient taking differently: Take 300 mg by mouth 3 (three) times daily as needed. ) 90 capsule 11  . Glycopyrrolate-Formoterol (BEVESPI AEROSPHERE) 9-4.8 MCG/ACT AERO Inhale 2 puffs into the lungs 2 (two) times daily. 10.7 g 6  . levothyroxine (SYNTHROID, LEVOTHROID) 175 MCG tablet Take 175 mcg by mouth daily before breakfast.    . losartan (COZAAR) 50 MG tablet Take 50 mg by mouth daily.    . metFORMIN (GLUCOPHAGE) 500 MG tablet     . omeprazole (PRILOSEC) 40 MG capsule Take 40 mg by mouth daily.     No current facility-administered medications on file prior to visit.    Allergies  Allergen Reactions  . Doxycycline Other (See Comments)    Flu like symptoms (high fever, chills)  . Nitrofurantoin Monohyd Macro Other (See Comments)    Flu like symptoms (high fever, chills)  . Septra [Sulfamethoxazole-Trimethoprim] Other (See Comments)    Flu like symptoms (high fever, chills)  . Lamictal [Lamotrigine] Rash   Family History  Problem Relation Age of Onset  . Hypertension Mother   . Heart disease Mother   . Diabetes Father   . Heart disease Father   . Heart disease Brother   . Breast cancer Maternal Grandmother   . Seizures Neg Hx    PE: BP 122/82 (BP Location: Left Arm, Patient Position: Sitting)   Pulse 93   Ht 5' 1.5" (1.562 m)   Wt 183 lb (83 kg)   SpO2 97%   BMI 34.02 kg/m  Wt Readings from  Last 3 Encounters:  07/01/16 183 lb (83 kg)  06/25/16 183 lb 6.4 oz (83.2 kg)  06/04/16 185 lb (83.9 kg)   Constitutional: overweight (protuberant abdomen), in NAD, + buffalo hump, no full [place] fat pads Eyes: PERRLA, EOMI, no exophthalmos ENT: moist mucous membranes, no thyromegaly, no cervical lymphadenopathy Cardiovascular: RRR, No MRG Respiratory: CTA B Gastrointestinal: abdomen soft, NT, ND, BS+ Musculoskeletal: no deformities, strength intact in all 4 Skin: moist, warm, no rashes except spider veins on upper back. No white or purple striae. Neurological: no tremor with outstretched hands, DTR normal in all  4  ASSESSMENT: 1. Elevated cortisol level   2. DM2  PLAN: 1. Elevated cortisol level - Patient presents with several signs and symptoms suggestive of Cushing's syndrome: rapid weight gain, new diagnosis of diabetes, severe muscle aches, proximal muscle weakness, buffalo hump, + found to have a slightly elevated 24 hour cortisol level (normal pm cortisol)  - She does not have a history of exogenous steroid administration - A recent abdominal CT scan does not show an adrenal mass and a brain MRI does not show a pituitary mass (however, this was not a dedicated pituitary MRI). - At this visit, we discussed about what Cushing syndrome means and how we test for it. I explained that this could be caused by: - a pituitary adenoma that oversecretes ACTH (Cushing's disease) or  - an adrenal adenoma that oversecretes cortisol  (Cushing's syndrome)  - Sometimes, the excess steroid can come from outside (exogenous Cushing syndrome) - In other cases, a mild excess of cortisol can be encountered in people with depression, under tremendous stress, or with alcoholism. She denies these.  - Oral contraceptives can increase the cortisol binding globulin, and therefore elevate serum cortisol in a physiologic manner. Occasionally, they can stimulate the adrenals to oversecrete cortisol. - I suggested  to start by  checking a dexamethasone suppression test which is usually positive for pituitary and adrenal Cushing's pathology and normal for other physiologic cortisol elevations. We discussed that ideally the cortisol should be lower than 2.5 in the am after she takes dexamethasone. She will return to have this done at 8 AM, fasting, after taking dexamethasone 0.5 mg the night before. - If the test is positive, we will need a pituitary-dedicated MRI. If a tumor is found, she will need to see neurosurgery.  2. DM2 - We reviewed together with patient and her cousin her previous HbA1c from a year ago. This was normal. - However, patient had very high blood sugars recently and was started on metformin few days ago. She did not check sugars since starting metformin, so on. Her sugars are improving.  - At this visit, we checked an HbA1c and this was high, at 12.7% - She has blurry vision, is fatigued + weak, has nocturia, and a 10 pound weight loss in the last 4 months >> these symptoms can also be related to uncontrolled diabetes. A fatty liver could also be related.  -  I suggested to add long-acting insulin to her metformin until we get the sugars under control, and then started decreasing the dose of insulin and hopefully stopping the near future. However, she refuses insulin at this point, but would like to increase the metformin to target dose of 1000 mg 2x a day and start checking her sugars and she agrees to let me know in 2 weeks about the values and start insulin then if still needed  - We reviewed the hypoglycemia protocol  - Discussed about proper foot care  - Discussed about targets for CBGs depending on the relationship with a meal  - I will be out of the country starting 3 weeks from now but I would like to see her back when I return. In the meantime, I would be in touch with about her sugars in about her results.   - time spent with the patien and her cousin: 1 hour, of which >50% was spent  in obtaining information about her symptoms, reviewing her previous labs, evaluations, and treatments, counseling her about her conditions (please see the discussed  topics above), and developing a plan to further investigate and treat them; they had a number of questions which I addressed.  Component     Latest Ref Rng & Units 07/02/2016  Cortisol - AM     mcg/dL 1.8 (L)  Dexamethasone, Serum     ng/dL 385   The Dexamethasone suppression test is normal >> no signs of Cushing sd.   Philemon Kingdom, MD PhD [facility]

## 2016-07-01 NOTE — Telephone Encounter (Signed)
Patient called wanting an extension on her out of work note for at least a month out from today. CB # [phone]

## 2016-07-02 ENCOUNTER — Other Ambulatory Visit: Payer: BLUE CROSS/BLUE SHIELD

## 2016-07-02 ENCOUNTER — Telehealth: Payer: Self-pay | Admitting: Internal Medicine

## 2016-07-02 ENCOUNTER — Encounter: Payer: BLUE CROSS/BLUE SHIELD | Admitting: Internal Medicine

## 2016-07-02 ENCOUNTER — Telehealth (INDEPENDENT_AMBULATORY_CARE_PROVIDER_SITE_OTHER): Payer: Self-pay | Admitting: Orthopedic Surgery

## 2016-07-02 DIAGNOSIS — R82998 Other abnormal findings in urine: Secondary | ICD-10-CM

## 2016-07-02 LAB — POCT GLYCOSYLATED HEMOGLOBIN (HGB A1C): HEMOGLOBIN A1C: 12.7

## 2016-07-02 NOTE — Telephone Encounter (Signed)
Note generated. May pick up at front desk.

## 2016-07-02 NOTE — Telephone Encounter (Signed)
At present I feel the liver is responding most likely Cushing's causing fatty liver She has an endocrine appt pending. I expect the liver to improve when this issue is treated. Please repeat hepatic function panel and INR at this time Liver bx can be considered if endocrine does not think Cushing's is driving this entire symptom complex

## 2016-07-02 NOTE — Telephone Encounter (Signed)
Patient called needing a return to work note for 07/05/16. Patient advised she will pick up note tomorrow. The number to contact patient is [phone]

## 2016-07-02 NOTE — Telephone Encounter (Signed)
Left message for pt to call back  °

## 2016-07-02 NOTE — Telephone Encounter (Signed)
S/w Dr Marlou Sa. Unable to write note for patient since we are not actively following patient. We referred her out for further treatment/evaluation. I called patient and discussed with her.

## 2016-07-03 LAB — CORTISOL-AM, BLOOD: Cortisol - AM: 1.8 ug/dL — ABNORMAL LOW

## 2016-07-03 NOTE — Telephone Encounter (Signed)
Spoke with pt and let her know that the endocrinologist drew some labs yesterday and those results will show if she has cushings. She knows we need to wait on those results and verbalized understanding.

## 2016-07-10 LAB — DEXAMETHASONE, BLOOD: Dexamethasone, Serum: 385 ng/dL

## 2016-07-11 ENCOUNTER — Telehealth: Payer: Self-pay

## 2016-07-11 ENCOUNTER — Telehealth: Payer: Self-pay | Admitting: Family Medicine

## 2016-07-11 NOTE — Telephone Encounter (Signed)
Called and advised patient of note. No questions at this time.

## 2016-07-11 NOTE — Telephone Encounter (Signed)
Emma Glenn, we are working to get her DM under control for now. GI is keeping an eye on her liver.

## 2016-07-11 NOTE — Telephone Encounter (Signed)
Called patient and asked which questions she had regarding labs, She is questioning where to go from here. She is still having problems with her Liver and now since she does not have Cushing Syndrome what do you think she should do. Should she go see her GI doctor back now?   Please advise. Thank you!

## 2016-07-11 NOTE — Telephone Encounter (Signed)
Patient would like a call back to go over lab results.  Thank you,  -LL

## 2016-07-11 NOTE — Telephone Encounter (Signed)
Notified MD of questions.

## 2016-07-24 ENCOUNTER — Encounter: Payer: Self-pay | Admitting: Internal Medicine

## 2016-07-24 ENCOUNTER — Other Ambulatory Visit (INDEPENDENT_AMBULATORY_CARE_PROVIDER_SITE_OTHER): Payer: BLUE CROSS/BLUE SHIELD

## 2016-07-24 ENCOUNTER — Ambulatory Visit (INDEPENDENT_AMBULATORY_CARE_PROVIDER_SITE_OTHER): Payer: BLUE CROSS/BLUE SHIELD | Admitting: Internal Medicine

## 2016-07-24 VITALS — BP 118/78 | HR 93 | Ht 62.0 in | Wt 179.0 lb

## 2016-07-24 DIAGNOSIS — R899 Unspecified abnormal finding in specimens from other organs, systems and tissues: Secondary | ICD-10-CM

## 2016-07-24 DIAGNOSIS — R768 Other specified abnormal immunological findings in serum: Secondary | ICD-10-CM

## 2016-07-24 DIAGNOSIS — R1010 Upper abdominal pain, unspecified: Secondary | ICD-10-CM

## 2016-07-24 DIAGNOSIS — R945 Abnormal results of liver function studies: Secondary | ICD-10-CM | POA: Diagnosis not present

## 2016-07-24 DIAGNOSIS — R7989 Other specified abnormal findings of blood chemistry: Secondary | ICD-10-CM

## 2016-07-24 DIAGNOSIS — R5383 Other fatigue: Secondary | ICD-10-CM

## 2016-07-24 LAB — CBC WITH DIFFERENTIAL/PLATELET
BASOS ABS: 0.1 10*3/uL (ref 0.0–0.1)
Basophils Relative: 1.2 % (ref 0.0–3.0)
EOS ABS: 0.3 10*3/uL (ref 0.0–0.7)
Eosinophils Relative: 3.4 % (ref 0.0–5.0)
HEMATOCRIT: 39.1 % (ref 36.0–46.0)
Hemoglobin: 13 g/dL (ref 12.0–15.0)
LYMPHS ABS: 2.3 10*3/uL (ref 0.7–4.0)
LYMPHS PCT: 29.9 % (ref 12.0–46.0)
MCHC: 33.3 g/dL (ref 30.0–36.0)
MCV: 89.9 fl (ref 78.0–100.0)
MONOS PCT: 8.1 % (ref 3.0–12.0)
Monocytes Absolute: 0.6 10*3/uL (ref 0.1–1.0)
NEUTROS ABS: 4.4 10*3/uL (ref 1.4–7.7)
NEUTROS PCT: 57.4 % (ref 43.0–77.0)
PLATELETS: 289 10*3/uL (ref 150.0–400.0)
RBC: 4.35 Mil/uL (ref 3.87–5.11)
RDW: 15 % (ref 11.5–15.5)
WBC: 7.7 10*3/uL (ref 4.0–10.5)

## 2016-07-24 LAB — COMPREHENSIVE METABOLIC PANEL
ALK PHOS: 79 U/L (ref 39–117)
ALT: 49 U/L — AB (ref 0–35)
AST: 78 U/L — AB (ref 0–37)
Albumin: 4.5 g/dL (ref 3.5–5.2)
BILIRUBIN TOTAL: 0.7 mg/dL (ref 0.2–1.2)
BUN: 25 mg/dL — ABNORMAL HIGH (ref 6–23)
CO2: 26 meq/L (ref 19–32)
CREATININE: 1.14 mg/dL (ref 0.40–1.20)
Calcium: 9.5 mg/dL (ref 8.4–10.5)
Chloride: 102 mEq/L (ref 96–112)
GFR: 53.09 mL/min — ABNORMAL LOW (ref >60.00)
GLUCOSE: 225 mg/dL — AB (ref 70–99)
Potassium: 4.2 mEq/L (ref 3.5–5.1)
Sodium: 139 mEq/L (ref 135–145)
TOTAL PROTEIN: 8.3 g/dL (ref 6.0–8.3)

## 2016-07-24 LAB — PROTIME-INR
INR: 1.3 ratio — ABNORMAL HIGH (ref 0.8–1.0)
Prothrombin Time: 13.7 s — ABNORMAL HIGH (ref 9.6–13.1)

## 2016-07-24 NOTE — Progress Notes (Signed)
Subjective:    Patient ID: Emma Glenn, female    DOB: July 25, 1965, 51 y.o.   MRN: [id number]  HPI Mellie Buccellato is a 51 year old female with a past medical history of GERD, Graves' disease status post radioactive iodine therapy, hypertension who was seen initially in April 2018 to evaluate hepatomegaly with right upper quadrant abdominal pain, weight gain and muscle cramps. We performed a myriad of lab tests and she was found to have very elevated glucose. I was suspicious for possible Cushing's and she did have an elevated urine free cortisol. She was sent to endocrinology and she underwent a dexamethasone suppression test which was negative for Cushing's. They are working with her 1 sugars/diabetes. She is taking metformin 1 g twice a day  She has continued to have upper abdominal pain as well as back pain and right flank pain. Pain is more often in her right upper quadrant and right back then all the way across the upper abdomen. Energy levels have continued to be poor. She has frequent muscle cramps as well as muscle soreness. She still feels upper abdominal bloating with eating. She's continue omeprazole 40 mg daily and has had no heartburn. No dysphagia. Bowel movements vary from loose and urgent to formed and regular. Denies blood in her stool and melena. She has lost 6 pounds being diet conscious. Overall still up over 40 pounds in the past year. She denies fever and chills.  Average blood sugars recently have been between 180 and 280 when she checks them at home.  Prior Eval: Workup of right upper quadrant pain has included abdominal imaging specifically ultrasound in February 2018 which showed fatty liver but no other focal abnormality. Follow-up HIDA scan in March 2018 showed normal visualization of the gallbladder and bowel activity. Ejection fraction was low normal at 39%.  CT abdomen was performed 6 days ago which showed no abdominal mass other than hepatomegaly. There was hepatic  steatosis. There is new mild upper abdominal adenopathy favored to be benign but CT pelvis was recommended.  Review of Systems As per history of present illness, otherwise negative  Current Medications, Allergies, Past Medical History, Past Surgical History, Family History and Social History were reviewed in [employer] record.     Objective:   Physical Exam BP 118/78   Pulse 93   Ht _0  (1.575 m)   Wt 179 lb (81.2 kg)   BMI 32.74 kg/m  Constitutional: Well-developed and well-nourished. No distress. HEENT: Normocephalic and atraumatic. Oropharynx is clear and moist. Conjunctivae are normal.  No scleral icterus. Neck: Neck supple. Trachea midline. Cardiovascular: Normal rate, regular rhythm and intact distal pulses. No M/R/G Pulmonary/chest: Effort normal and breath sounds normal. No wheezing, rales or rhonchi. Abdominal: Soft, Hepatomegaly, obese, tender across the upper abdomen without rebound or guarding, nondistended. Bowel sounds active throughout. Extremities: no clubbing, cyanosis, or edema Neurological: Alert and oriented to person place and time. Skin: Skin is warm and dry. Psychiatric: Normal mood and affect. Behavior is normal.  CBC    Component Value Date/Time   WBC 7.7 07/24/2016 1051   RBC 4.35 07/24/2016 1051   HGB 13.0 07/24/2016 1051   HCT 39.1 07/24/2016 1051   PLT 289.0 07/24/2016 1051   MCV 89.9 07/24/2016 1051   MCH 31.4 09/20/2015 1309   MCHC 33.3 07/24/2016 1051   RDW 15.0 07/24/2016 1051   LYMPHSABS 2.3 07/24/2016 1051   MONOABS 0.6 07/24/2016 1051   EOSABS 0.3 07/24/2016 1051   BASOSABS  0.1 07/24/2016 1051   CMP     Component Value Date/Time   NA 139 07/24/2016 1051   NA 137 06/25/2016 1347   K 4.2 07/24/2016 1051   CL 102 07/24/2016 1051   CO2 26 07/24/2016 1051   GLUCOSE 225 (H) 07/24/2016 1051   BUN 25 (H) 07/24/2016 1051   BUN 14 06/25/2016 1347   CREATININE 1.14 07/24/2016 1051   CALCIUM 9.5 07/24/2016 1051     PROT 8.3 07/24/2016 1051   ALBUMIN 4.5 07/24/2016 1051   AST 78 (H) 07/24/2016 1051   ALT 49 (H) 07/24/2016 1051   ALKPHOS 79 07/24/2016 1051   BILITOT 0.7 07/24/2016 1051   GFRNONAA 97 06/25/2016 1347   GFRAA 111 06/25/2016 1347   ANA + 1:320 ASMA + AMA neg IgG normal Ceruloplasmin normal Alpha 1, slightly high at 230 Viral hep all neg Aldolase normal Cortisol normal  Iron/TIBC/Ferritin/ %Sat    Component Value Date/Time   IRON 47 06/04/2016 1656   FERRITIN 56.5 06/04/2016 1656   IRONPCTSAT 10.6 (L) 06/04/2016 1656     Lab Results  Component Value Date   TSH 4.45 06/04/2016       Assessment & Plan:  51 year old female with a past medical history of GERD, Graves' disease status post radioactive iodine therapy, hypertension who was seen initially in April 2018 to evaluate hepatomegaly with right upper quadrant abdominal pain, weight gain and muscle cramps.  1.  Mild elevation in liver enzymes/positive ANA and smooth muscle antibody/upper abdominal pain/fatigue/muscle cramps -- repeat CBC, CMP and INR today. I recommended liver biopsy to fully exclude autoimmune hepatitis given the elevated liver enzymes, ANA and anti-smooth muscle antibody. Interestingly IgG was normal. Cushing's with unexplained weight gain, fatty liver, hyperglycemia, etc. but this evaluation was negative per endocrinology. Await liver biopsy findings. Upper endoscopy will be pursued to evaluate upper abdominal pain. We discussed the risks, benefits and alternatives and she is agreeable to proceed. Finally refer to rheumatology as I expect there will be await for this appointment, particularly needed if liver biopsy unremarkable other than fatty liver given her very positive ANA and concern for autoimmune process (muscle aches, cramping).  2. Diabetes  -- following with endocrinology.   She has requested that I complete short-term disability. 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 Muscle cramps also her problem for her with pain and difficulty performing her work duties. I will complete this paperwork once I receive it while we continue her evaluation.   25 minutes spent with the patient today. Greater than 50% was spent in counseling and coordination of care with the patient

## 2016-07-24 NOTE — Patient Instructions (Signed)
Your physician has requested that you go to the basement for the following lab work before leaving today: CBC, CMP, INR  You have been scheduled for an endoscopy. Please follow written instructions given to you at your visit today. If you use inhalers (even only as needed), please bring them with you on the day of your procedure. Your physician has requested that you go to www.startemmi.com and enter the access code given to you at your visit today. This web site gives a general overview about your procedure. However, you should still follow specific instructions given to you by our office regarding your preparation for the procedure.  We have ordered a liver biopsy on you. You should be getting a call from [facility] radiology within the next several days. If you do not, please call Dottie at [phone].  We will be scheduling you to see Dr Amil Amen. We will contact you with an appointment date and time once we have gotten one.  If you are age 29 or older, your body mass index should be between 23-30. Your Body mass index is 32.74 kg/m. If this is out of the aforementioned range listed, please consider follow up with your Primary Care Provider.  If you are age 24 or younger, your body mass index should be between 19-25. Your Body mass index is 32.74 kg/m. If this is out of the aformentioned range listed, please consider follow up with your Primary Care Provider.

## 2016-07-25 ENCOUNTER — Encounter: Payer: Self-pay | Admitting: Internal Medicine

## 2016-07-29 ENCOUNTER — Other Ambulatory Visit: Payer: Self-pay | Admitting: Radiology

## 2016-07-30 ENCOUNTER — Other Ambulatory Visit: Payer: Self-pay | Admitting: General Surgery

## 2016-07-31 ENCOUNTER — Telehealth: Payer: Self-pay | Admitting: *Deleted

## 2016-07-31 ENCOUNTER — Ambulatory Visit (HOSPITAL_COMMUNITY)
Admission: RE | Admit: 2016-07-31 | Discharge: 2016-07-31 | Disposition: A | Discharge status: Home / Self Care | Payer: BLUE CROSS/BLUE SHIELD | Source: Ambulatory Visit | Attending: Internal Medicine | Admitting: Internal Medicine

## 2016-07-31 ENCOUNTER — Encounter (HOSPITAL_COMMUNITY): Payer: Self-pay

## 2016-07-31 DIAGNOSIS — K7581 Nonalcoholic steatohepatitis (NASH): Secondary | ICD-10-CM | POA: Diagnosis not present

## 2016-07-31 DIAGNOSIS — I10 Essential (primary) hypertension: Secondary | ICD-10-CM | POA: Diagnosis not present

## 2016-07-31 DIAGNOSIS — E039 Hypothyroidism, unspecified: Secondary | ICD-10-CM | POA: Diagnosis not present

## 2016-07-31 DIAGNOSIS — K219 Gastro-esophageal reflux disease without esophagitis: Secondary | ICD-10-CM | POA: Insufficient documentation

## 2016-07-31 DIAGNOSIS — E119 Type 2 diabetes mellitus without complications: Secondary | ICD-10-CM | POA: Insufficient documentation

## 2016-07-31 DIAGNOSIS — R899 Unspecified abnormal finding in specimens from other organs, systems and tissues: Secondary | ICD-10-CM

## 2016-07-31 DIAGNOSIS — Z79899 Other long term (current) drug therapy: Secondary | ICD-10-CM | POA: Insufficient documentation

## 2016-07-31 DIAGNOSIS — R768 Other specified abnormal immunological findings in serum: Secondary | ICD-10-CM | POA: Diagnosis present

## 2016-07-31 DIAGNOSIS — Z7984 Long term (current) use of oral hypoglycemic drugs: Secondary | ICD-10-CM | POA: Insufficient documentation

## 2016-07-31 DIAGNOSIS — Z87891 Personal history of nicotine dependence: Secondary | ICD-10-CM | POA: Diagnosis not present

## 2016-07-31 LAB — CBC WITH DIFFERENTIAL/PLATELET
BASOS ABS: 0.1 10*3/uL (ref 0.0–0.1)
Basophils Relative: 1 %
EOS ABS: 0.3 10*3/uL (ref 0.0–0.7)
EOS PCT: 4 %
HCT: 37.5 % (ref 36.0–46.0)
Hemoglobin: 12.9 g/dL (ref 12.0–15.0)
LYMPHS PCT: 29 %
Lymphs Abs: 2.3 10*3/uL (ref 0.7–4.0)
MCH: 30.4 pg (ref 26.0–34.0)
MCHC: 34.4 g/dL (ref 30.0–36.0)
MCV: 88.4 fL (ref 78.0–100.0)
MONO ABS: 0.6 10*3/uL (ref 0.1–1.0)
Monocytes Relative: 7 %
Neutro Abs: 4.8 10*3/uL (ref 1.7–7.7)
Neutrophils Relative %: 59 %
PLATELETS: 284 10*3/uL (ref 150–400)
RBC: 4.24 MIL/uL (ref 3.87–5.11)
RDW: 14.4 % (ref 11.5–15.5)
WBC: 8.1 10*3/uL (ref 4.0–10.5)

## 2016-07-31 LAB — COMPREHENSIVE METABOLIC PANEL
ALT: 55 U/L — ABNORMAL HIGH (ref 14–54)
ANION GAP: 13 (ref 5–15)
AST: 81 U/L — ABNORMAL HIGH (ref 15–41)
Albumin: 4.3 g/dL (ref 3.5–5.0)
Alkaline Phosphatase: 80 U/L (ref 38–126)
BILIRUBIN TOTAL: 0.9 mg/dL (ref 0.3–1.2)
BUN: 14 mg/dL (ref 6–20)
CO2: 22 mmol/L (ref 22–32)
Calcium: 9.3 mg/dL (ref 8.9–10.3)
Chloride: 104 mmol/L (ref 101–111)
Creatinine, Ser: 0.93 mg/dL (ref 0.44–1.00)
GFR calc Af Amer: >60 mL/min (ref >60)
Glucose, Bld: 216 mg/dL — ABNORMAL HIGH (ref 65–99)
POTASSIUM: 4 mmol/L (ref 3.5–5.1)
Sodium: 139 mmol/L (ref 135–145)
TOTAL PROTEIN: 8.7 g/dL — AB (ref 6.5–8.1)

## 2016-07-31 LAB — GLUCOSE, CAPILLARY: GLUCOSE-CAPILLARY: 210 mg/dL — AB (ref 65–99)

## 2016-07-31 LAB — PROTIME-INR
INR: 1.04
Prothrombin Time: 13.6 seconds (ref 11.4–15.2)

## 2016-07-31 MED ORDER — OXYCODONE HCL 5 MG PO TABS
5.0000 mg | ORAL_TABLET | ORAL | Status: DC | PRN
  Filled 2016-07-31: qty 1

## 2016-07-31 MED ORDER — SODIUM CHLORIDE 0.9 % IV SOLN
INTRAVENOUS | Status: DC
  Administered 2016-07-31: 11:00:00 via INTRAVENOUS

## 2016-07-31 MED ORDER — MIDAZOLAM HCL 2 MG/2ML IJ SOLN
INTRAMUSCULAR | Status: AC | PRN
  Administered 2016-07-31 (×4): 1 mg via INTRAVENOUS

## 2016-07-31 MED ORDER — FENTANYL CITRATE (PF) 100 MCG/2ML IJ SOLN
INTRAMUSCULAR | Status: AC | PRN
  Administered 2016-07-31 (×2): 50 ug via INTRAVENOUS

## 2016-07-31 NOTE — Procedures (Signed)
US guided core biopsy from right hepatic lobe.  3 cores obtained. Minimal blood loss.  No immediate complication.

## 2016-07-31 NOTE — Telephone Encounter (Signed)
Patient is scheduled to see Dr Amil Amen for + ANA and concern for autoimmune process on 10/03/16 @ 1:30 pm. Patient states that she is already aware of this appointment.

## 2016-07-31 NOTE — Consult Note (Signed)
Chief Complaint: Patient was seen in consultation today for image guided random core liver biopsy  Referring Physician(s): Pyrtle,Jay M  Supervising Physician: Markus Daft  Patient Status: [facility] - Out-pt  History of Present Illness: Emma Glenn is a 51 y.o. female with past medical history of hypertension, diabetes, GERD as well as abdominal pain, elevated liver function tests, positive ANA and anti-smooth muscle antibody, hepatomegaly and fatty liver by imaging. She presents today for image guided random core liver biopsy for further evaluation.  Past Medical History:  Diagnosis Date  . CIN I (cervical intraepithelial neoplasia I)   . GERD (gastroesophageal reflux disease)   . Hepatic steatosis   . Hypertension   . Hypothyroidism   . Labial cyst    Inclusion cyst  . Seizures ([place])    last seizure 3/17? over dose wellbutrin  . Thyroid disease    Graves dis-Radioactive Iodine    Past Surgical History:  Procedure Laterality Date  . CERVICAL BIOPSY  W/ LOOP ELECTRODE EXCISION     Excision labial inclusion cyst  . CESAREAN SECTION    . COLPOSCOPY    . HAND SURGERY    . SHOULDER ARTHROSCOPY WITH OPEN ROTATOR CUFF REPAIR AND DISTAL CLAVICLE ACROMINECTOMY Left 06/13/2015   Procedure: LEFT SHOULDER ARTHROSCOPY, DEBRIDEMENT, OPEN ROTATOR CUFF REPAIR, CORACOID FRACTURE FIXATION, BICEPS TENODESIS;  Surgeon: Meredith Pel, MD;  Location: [place];  Service: Orthopedics;  Laterality: Left;  . SHOULDER CLOSED REDUCTION Left 09/26/2015   Procedure: CLOSED MANIPULATION SHOULDER UNDER ANESTHESIA;  Surgeon: Meredith Pel, MD;  Location: [place];  Service: Orthopedics;  Laterality: Left;  . TUBAL LIGATION      Allergies: Doxycycline; Nitrofurantoin monohyd macro; Septra [sulfamethoxazole-trimethoprim]; and Lamictal [lamotrigine]  Medications: Prior to Admission medications   Medication Sig Start Date End Date Taking? Authorizing Provider  dexamethasone (DECADRON) 1 MG tablet Take  1 tablet by mouth once at 11 pm, before coming for labs at 8 am the next morning 07/01/16   Philemon Kingdom, MD  gabapentin (NEURONTIN) 300 MG capsule Take 1 capsule (300 mg total) by mouth 3 (three) times daily as needed. Patient taking differently: Take 300 mg by mouth 3 (three) times daily as needed.  06/25/16   Melvenia Beam, MD  glucose blood (BAYER CONTOUR TEST) test strip Use 2x a day 07/01/16   Philemon Kingdom, MD  Glycopyrrolate-Formoterol (BEVESPI AEROSPHERE) 9-4.8 MCG/ACT AERO Inhale 2 puffs into the lungs 2 (two) times daily. 05/09/16   Mannam, Hart Robinsons, MD  levothyroxine (SYNTHROID, LEVOTHROID) 175 MCG tablet Take 175 mcg by mouth daily before breakfast.    [provider]  losartan (COZAAR) 50 MG tablet Take 50 mg by mouth daily.    [provider]  metFORMIN (GLUCOPHAGE) 500 MG tablet Take 2,000 mg by mouth daily. Patient takes two 530m tabs in the morning and two 5036mtabs at night 06/17/16   [provider]  omeprazole (PRILOSEC) 40 MG capsule Take 40 mg by mouth daily.    [provider]     Family History  Problem Relation Age of Onset  . Hypertension Mother   . Heart disease Mother   . Diabetes Father   . Heart disease Father   . Heart disease Brother   . Breast cancer Maternal Grandmother   . Seizures Neg Hx     Social History   Social History  . Marital status: Married    Spouse name: RaTommie Raymond. Number of children: 1  . Years  of education: 54   Social History Main Topics  . Smoking status: Former Smoker    Packs/day: 1.00    Types: Cigarettes    Quit date: 02/14/2015  . Smokeless tobacco: Never Used  . Alcohol use 0.0 oz/week     Comment: rare  . Drug use: No     Comment: Hx of marijuana use  . Sexual activity: Yes    Birth control/ protection: Surgical   Other Topics Concern  . Not on file   Social History Narrative   Lives with husband and sister   Caffeine use: Tea/coffee daily         Review of Systems  Denies fever, headache, chest pain, dyspnea, cough, back pain, nausea, vomiting or abnormal bleeding. She does have some minimal right upper quadrant discomfort.  Vital Signs: Vitals:   07/31/16 1100  BP: 136/90  Pulse: (!) 106  Resp: 18  Temp: 98.2 F (36.8 C)    LMP 06/23/2016   Physical Exam Awake, alert. Chest clear to auscultation bilaterally. Heart with tachycardic but regular rhythm. Abdomen obese, soft, positive bowel sounds, mild right upper quadrant tenderness to palpation; no lower extremity edema  Mallampati Score:     Imaging: No results found.  Labs:  CBC:  Recent Labs  09/20/15 1309 06/04/16 1656 07/24/16 1051 07/31/16 1119  WBC 10.1 9.0 7.7 8.1  HGB 14.2 13.7 13.0 12.9  HCT 42.6 40.6 39.1 37.5  PLT 219 315.0 289.0 284    COAGS:  Recent Labs  06/04/16 1656 07/24/16 1051  INR 1.2* 1.3*    BMP:  Recent Labs  09/20/15 1309 06/04/16 1656 06/25/16 1347 07/24/16 1051  NA 139 131* 137 139  K 4.5 4.3 5.0 4.2  CL 104 95* 94* 102  CO2 27 27 24 26   GLUCOSE 143* 463* 396* 225*  BUN 17 12 14  25*  CALCIUM 8.9 9.5 9.7 9.5  CREATININE 0.81 0.93 0.72 1.14  GFRNONAA >60  --  97  --   GFRAA >60  --  111  --     LIVER FUNCTION TESTS:  Recent Labs  06/04/16 1656 07/24/16 1051  BILITOT 0.7 0.7  AST 63* 78*  ALT 43* 49*  ALKPHOS 107 79  PROT 8.1 8.3  ALBUMIN 4.0 4.5    TUMOR MARKERS: No results for input(s): AFPTM, CEA, CA199, CHROMGRNA in the last 8760 hours.  Assessment and Plan: 51 y.o. female with past medical history of hypertension, diabetes, GERD as well as abdominal pain, elevated liver function tests, positive ANA and anti-smooth muscle antibody, hepatomegaly and fatty liver by imaging. She presents today for image guided random core liver biopsy for further evaluation.Risks and benefits discussed with the patient/mother including, but not limited to bleeding, infection, damage to adjacent structures or low yield requiring  additional tests.All of the patient's questions were answered, patient is agreeable to proceed.Consent signed and in chart.Labs pending.     Thank you for this interesting consult.  I greatly enjoyed meeting Emma Glenn and look forward to participating in their care.  A copy of this report was sent to the requesting provider on this date.  Electronically Signed: D. Rowe Robert, PA-C 07/31/2016, 11:38 AM   I spent a total of  25 minutes   in face to face in clinical consultation, greater than 50% of which was counseling/coordinating care for image guided random core liver biopsy

## 2016-07-31 NOTE — Discharge Instructions (Signed)
Moderate Conscious Sedation, Adult, Care After °These instructions provide you with information about caring for yourself after your procedure. Your health care provider may also give you more specific instructions. Your treatment has been planned according to current medical practices, but problems sometimes occur. Call your health care provider if you have any problems or questions after your procedure. °What can I expect after the procedure? °After your procedure, it is common: °· To feel sleepy for several hours. °· To feel clumsy and have poor balance for several hours. °· To have poor judgment for several hours. °· To vomit if you eat too soon. ° °Follow these instructions at home: °For at least 24 hours after the procedure: ° °· Do not: °? Participate in activities where you could fall or become injured. °? Drive. °? Use heavy machinery. °? Drink alcohol. °? Take sleeping pills or medicines that cause drowsiness. °? Make important decisions or sign legal documents. °? Take care of children on your own. °· Rest. °Eating and drinking °· Follow the diet recommended by your health care provider. °· If you vomit: °? Drink water, juice, or soup when you can drink without vomiting. °? Make sure you have little or no nausea before eating solid foods. °General instructions °· Have a responsible adult stay with you until you are awake and alert. °· Take over-the-counter and prescription medicines only as told by your health care provider. °· If you smoke, do not smoke without supervision. °· Keep all follow-up visits as told by your health care provider. This is important. °Contact a health care provider if: °· You keep feeling nauseous or you keep vomiting. °· You feel light-headed. °· You develop a rash. °· You have a fever. °Get help right away if: °· You have trouble breathing. °This information is not intended to replace advice given to you by your health care provider. Make sure you discuss any questions you have  with your health care provider. °Document Released: 11/18/2012 Document Revised: 07/03/2015 Document Reviewed: 05/20/2015 °Elsevier Interactive Patient Education © 2018 [employer] ° ° °Liver Biopsy, Care After °These instructions give you information on caring for yourself after your procedure. Your doctor may also give you more specific instructions. Call your doctor if you have any problems or questions after your procedure. °Follow these instructions at home: °· Rest at home for 1-2 days or as told by your doctor. °· Have someone stay with you for at least 24 hours. °· Do not do these things in the first 24 hours: °? Drive. °? Use machinery. °? Take care of other people. °? Sign legal documents. °? Take a bath or shower. °· There are many different ways to close and cover a cut (incision). For example, a cut can be closed with stitches, skin glue, or adhesive strips. Follow your doctor's instructions on: °? Taking care of your cut. °? Changing and removing your bandage (dressing). °? Removing whatever was used to close your cut. °· Do not drink alcohol in the first week. °· Do not lift more than 5 pounds or play contact sports for the first 2 weeks. °· Take medicines only as told by your doctor. For 1 week, do not take medicine that has aspirin in it or medicines like ibuprofen. °· Get your test results. °Contact a doctor if: °· A cut bleeds and leaves more than just a small spot of blood. °· A cut is red, puffs up (swells), or hurts more than before. °· Fluid or something else   comes from a cut. °· A cut smells bad. °· You have a fever or chills. °Get help right away if: °· You have swelling, bloating, or pain in your belly (abdomen). °· You get dizzy or faint. °· You have a rash. °· You feel sick to your stomach (nauseous) or throw up (vomit). °· You have trouble breathing, feel short of breath, or feel faint. °· Your chest hurts. °· You have problems talking or seeing. °· You have trouble balancing or moving  your arms or legs. °This information is not intended to replace advice given to you by your health care provider. Make sure you discuss any questions you have with your health care provider. °Document Released: 11/07/2007 Document Revised: 07/06/2015 Document Reviewed: 03/26/2013 °Elsevier Interactive Patient Education © 2018 Elsevier Inc. ° ° °

## 2016-08-08 ENCOUNTER — Encounter: Payer: Self-pay | Admitting: Internal Medicine

## 2016-08-08 ENCOUNTER — Ambulatory Visit (AMBULATORY_SURGERY_CENTER): Payer: BLUE CROSS/BLUE SHIELD | Admitting: Internal Medicine

## 2016-08-08 VITALS — BP 127/78 | HR 88 | Temp 98.6°F | Resp 11 | Ht 62.0 in | Wt 179.0 lb

## 2016-08-08 DIAGNOSIS — R1010 Upper abdominal pain, unspecified: Secondary | ICD-10-CM | POA: Diagnosis present

## 2016-08-08 DIAGNOSIS — R197 Diarrhea, unspecified: Secondary | ICD-10-CM

## 2016-08-08 DIAGNOSIS — K2950 Unspecified chronic gastritis without bleeding: Secondary | ICD-10-CM | POA: Diagnosis not present

## 2016-08-08 DIAGNOSIS — K3189 Other diseases of stomach and duodenum: Secondary | ICD-10-CM

## 2016-08-08 MED ORDER — SODIUM CHLORIDE 0.9 % IV SOLN: 500.0000 mL | INTRAVENOUS | Status: DC

## 2016-08-08 NOTE — Patient Instructions (Signed)
YOU HAD AN ENDOSCOPIC PROCEDURE TODAY AT [facility]:   Refer to the procedure report that was given to you for any specific questions about what was found during the examination.  If the procedure report does not answer your questions, please call your gastroenterologist to clarify.  If you requested that your care partner not be given the details of your procedure findings, then the procedure report has been included in a sealed envelope for you to review at your convenience later.  YOU SHOULD EXPECT: Some feelings of bloating in the abdomen. Passage of more gas than usual.  Walking can help get rid of the air that was put into your GI tract during the procedure and reduce the bloating. If you had a lower endoscopy (such as a colonoscopy or flexible sigmoidoscopy) you may notice spotting of blood in your stool or on the toilet paper. If you underwent a bowel prep for your procedure, you may not have a normal bowel movement for a few days.  Please Note:  You might notice some irritation and congestion in your nose or some drainage.  This is from the oxygen used during your procedure.  There is no need for concern and it should clear up in a day or so.  SYMPTOMS TO REPORT IMMEDIATELY:    Following upper endoscopy (EGD)  Vomiting of blood or coffee ground material  New chest pain or pain under the shoulder blades  Painful or persistently difficult swallowing  New shortness of breath  Fever of 100F or higher  Black, tarry-looking stools  For urgent or emergent issues, a gastroenterologist can be reached at any hour by calling [phone].   DIET:  We do recommend a small meal at first, but then you may proceed to your regular diet.  Drink plenty of fluids but you should avoid alcoholic beverages for 24 hours.  ACTIVITY:  You should plan to take it easy for the rest of today and you should NOT DRIVE or use heavy machinery until tomorrow (because of the sedation medicines used  during the test).    FOLLOW UP: Our staff will call the number listed on your records the next business day following your procedure to check on you and address any questions or concerns that you may have regarding the information given to you following your procedure. If we do not reach you, we will leave a message.  However, if you are feeling well and you are not experiencing any problems, there is no need to return our call.  We will assume that you have returned to your regular daily activities without incident.  If any biopsies were taken you will be contacted by phone or by letter within the next 1-3 weeks.  Please call us at [phone] if you have not heard about the biopsies in 3 weeks.    SIGNATURES/CONFIDENTIALITY: You and/or your care partner have signed paperwork which will be entered into your electronic medical record.  These signatures attest to the fact that that the information above on your After Visit Summary has been reviewed and is understood.  Full responsibility of the confidentiality of this discharge information lies with you and/or your care-partner.    Handout was given to your care partner on a hiatal hernia. You may resume your current medications today. Await biopsy results. To the lab for blood work on discharge. Please call if any questions or concerns.

## 2016-08-08 NOTE — Progress Notes (Signed)
Pt soiled her panties, I placed her in a diaper to to to lab then d/c to home. maw

## 2016-08-08 NOTE — Progress Notes (Signed)
Called to room to assist during endoscopic procedure.  Patient ID and intended procedure confirmed with present staff. Received instructions for my participation in the procedure from the performing physician.  

## 2016-08-08 NOTE — Op Note (Signed)
[facility] Patient Name: Emma Glenn Procedure Date: 08/08/2016 10:11 AM MRN: [id number] Endoscopist: Jerene Bears , MD Age: 51 Referring MD:  Date of Birth: [birth year] Gender: Female Account #: [id number] Procedure:                Upper GI endoscopy Indications:              Upper abdominal pain, NAFLD Medicines:                Monitored Anesthesia Care Procedure:                Pre-Anesthesia Assessment:                           - Prior to the procedure, a History and Physical                            was performed, and patient medications and                            allergies were reviewed. The patient's tolerance of                            previous anesthesia was also reviewed. The risks                            and benefits of the procedure and the sedation                            options and risks were discussed with the patient.                            All questions were answered, and informed consent                            was obtained. Prior Anticoagulants: The patient has                            taken no previous anticoagulant or antiplatelet                            agents. ASA Grade Assessment: II - A patient with                            mild systemic disease. After reviewing the risks                            and benefits, the patient was deemed in                            satisfactory condition to undergo the procedure.                           After obtaining informed consent, the endoscope was  passed under direct vision. Throughout the                            procedure, the patient's blood pressure, pulse, and                            oxygen saturations were monitored continuously. The                            Endoscope was introduced through the mouth, and                            advanced to the second part of duodenum. The upper                            GI endoscopy was accomplished  without difficulty.                            The patient tolerated the procedure well. Scope In: Scope Out: Findings:                 The examined esophagus was normal. Z-line regular                            at 36 cm. No varices.                           A 2 cm hiatal hernia was present.                           Mild portal hypertensive gastropathy was found in                            the gastric fundus and in the gastric body.                           The exam of the stomach was otherwise normal.                           Biopsies were taken with a cold forceps in the                            gastric body, at the incisura and in the gastric                            antrum for histology and Helicobacter pylori                            testing.                           The examined duodenum was normal. Complications:            No immediate complications. Estimated Blood Loss:     Estimated blood loss was minimal. Impression:               -  Normal esophagus.                           - 2 cm hiatal hernia.                           - Mild portal hypertensive gastropathy.                           - Normal examined duodenum.                           - Biopsies were taken with a cold forceps for                            histology and Helicobacter pylori testing. Recommendation:           - Patient has a contact number available for                            emergencies. The signs and symptoms of potential                            delayed complications were discussed with the                            patient. Return to normal activities tomorrow.                            Written discharge instructions were provided to the                            patient.                           - Resume previous diet.                           - Continue present medications.                           - Await pathology results. Jerene Bears, MD 08/08/2016 10:30:44 AM This  report has been signed electronically.

## 2016-08-08 NOTE — Progress Notes (Signed)
Report given to PACU, vss 

## 2016-08-08 NOTE — Progress Notes (Signed)
Per Dr. Hilarie Fredrickson pt to lab on discharge.  He has order in EPIC. Maw   No problems noted in the recovery room. maw

## 2016-08-09 ENCOUNTER — Telehealth: Payer: Self-pay | Admitting: *Deleted

## 2016-08-09 NOTE — Telephone Encounter (Signed)
  Follow up Call-  Call back number 08/08/2016  Post procedure Call Back phone  # [phone]  Permission to leave phone message Yes  Some recent data might be hidden     Patient questions:  Do you have a fever, pain , or abdominal swelling? No. Pain Score  0 *  Have you tolerated food without any problems? Yes.    Have you been able to return to your normal activities? Yes.    Do you have any questions about your discharge instructions: Diet   No. Medications  No. Follow up visit  No.  Do you have questions or concerns about your Care? No.  Actions: * If pain score is 4 or above: No action needed, pain <4.

## 2016-08-15 ENCOUNTER — Telehealth: Payer: Self-pay | Admitting: Internal Medicine

## 2016-08-15 NOTE — Telephone Encounter (Signed)
I have left a voicemail for Emma Glenn with Dr Vena Rua response (Debbie's voicemail stated her name and that it is okay to leave detailed patient information).

## 2016-08-15 NOTE — Telephone Encounter (Signed)
Treatment plan is to await rheumatology evaluation for -- elevated ANA, muscle cramping, muscle weakness, extreme fatigue. She is also following with endocrinology for her diabetes Functionally due to muscle weakness she is having a hard time performing activities of daily living and have extremely difficult time working More information will be available based on rheumatology opinion

## 2016-08-16 ENCOUNTER — Encounter: Payer: Self-pay | Admitting: Internal Medicine

## 2016-08-16 NOTE — Telephone Encounter (Signed)
ANA-1.320 Pts appt with Dr. Amil Amen 10/03/16@1 :30pm  Information given to [facility]

## 2016-08-16 NOTE — Telephone Encounter (Addendum)
Debbie calling back needing  1. Needs # for elevated ANA and when is patients appointment with rheumatology  Best # [phone]  Jackelyn Poling needs to have this completed by end of day today.

## 2016-08-20 ENCOUNTER — Encounter: Payer: Self-pay | Admitting: Internal Medicine

## 2016-08-20 NOTE — Progress Notes (Signed)
Labs from Emma Glenn, drawn on 07/29/2016: - HbA1c still very high, at 12.6%, previously 12.7% when I saw her a 06/2016. She refused insulin at that time, and only wanted to increase the metformin... - Her urine ACR was high, 148.9 - Her TSH was high, at 6.18. Her hypothyroidism is managed by PCP I believe the dose of levothyroxine has been increased by PCP from 175 to 200 g daily.

## 2016-08-26 ENCOUNTER — Ambulatory Visit: Payer: BLUE CROSS/BLUE SHIELD | Admitting: Pulmonary Disease

## 2016-09-11 ENCOUNTER — Telehealth: Payer: Self-pay | Admitting: Internal Medicine

## 2016-09-11 NOTE — Telephone Encounter (Signed)
Pt scheduled to see Dr. Hilarie Fredrickson 09/23/16@2pm . Pt aware of appt.

## 2016-09-20 ENCOUNTER — Encounter: Payer: Self-pay | Admitting: *Deleted

## 2016-09-23 ENCOUNTER — Other Ambulatory Visit: Payer: Self-pay | Admitting: *Deleted

## 2016-09-23 ENCOUNTER — Other Ambulatory Visit (INDEPENDENT_AMBULATORY_CARE_PROVIDER_SITE_OTHER): Payer: BLUE CROSS/BLUE SHIELD

## 2016-09-23 ENCOUNTER — Ambulatory Visit (INDEPENDENT_AMBULATORY_CARE_PROVIDER_SITE_OTHER): Payer: BLUE CROSS/BLUE SHIELD | Admitting: Internal Medicine

## 2016-09-23 ENCOUNTER — Encounter: Payer: Self-pay | Admitting: Internal Medicine

## 2016-09-23 VITALS — BP 112/70 | HR 99 | Ht 61.5 in | Wt 170.0 lb

## 2016-09-23 DIAGNOSIS — R1011 Right upper quadrant pain: Secondary | ICD-10-CM

## 2016-09-23 DIAGNOSIS — R7989 Other specified abnormal findings of blood chemistry: Secondary | ICD-10-CM | POA: Diagnosis not present

## 2016-09-23 DIAGNOSIS — R760 Raised antibody titer: Secondary | ICD-10-CM

## 2016-09-23 DIAGNOSIS — R195 Other fecal abnormalities: Secondary | ICD-10-CM

## 2016-09-23 DIAGNOSIS — K648 Other hemorrhoids: Secondary | ICD-10-CM

## 2016-09-23 DIAGNOSIS — R945 Abnormal results of liver function studies: Principal | ICD-10-CM

## 2016-09-23 DIAGNOSIS — R197 Diarrhea, unspecified: Secondary | ICD-10-CM | POA: Diagnosis not present

## 2016-09-23 DIAGNOSIS — R160 Hepatomegaly, not elsewhere classified: Secondary | ICD-10-CM

## 2016-09-23 LAB — IBC PANEL
IRON: 62 ug/dL (ref 42–145)
Saturation Ratios: 12.8 % — ABNORMAL LOW (ref 20.0–50.0)
TRANSFERRIN: 346 mg/dL (ref 212.0–360.0)

## 2016-09-23 LAB — CBC WITH DIFFERENTIAL/PLATELET
BASOS PCT: 1.1 % (ref 0.0–3.0)
Basophils Absolute: 0.1 10*3/uL (ref 0.0–0.1)
EOS PCT: 3.6 % (ref 0.0–5.0)
Eosinophils Absolute: 0.3 10*3/uL (ref 0.0–0.7)
HEMATOCRIT: 37.5 % (ref 36.0–46.0)
HEMOGLOBIN: 12.3 g/dL (ref 12.0–15.0)
LYMPHS PCT: 26.5 % (ref 12.0–46.0)
Lymphs Abs: 2.3 10*3/uL (ref 0.7–4.0)
MCHC: 32.8 g/dL (ref 30.0–36.0)
MCV: 92.5 fl (ref 78.0–100.0)
MONO ABS: 0.7 10*3/uL (ref 0.1–1.0)
Monocytes Relative: 7.7 % (ref 3.0–12.0)
Neutro Abs: 5.3 10*3/uL (ref 1.4–7.7)
Neutrophils Relative %: 61.1 % (ref 43.0–77.0)
Platelets: 294 10*3/uL (ref 150.0–400.0)
RBC: 4.06 Mil/uL (ref 3.87–5.11)
RDW: 17.2 % — ABNORMAL HIGH (ref 11.5–15.5)
WBC: 8.7 10*3/uL (ref 4.0–10.5)

## 2016-09-23 LAB — HEPATIC FUNCTION PANEL
ALBUMIN: 4.5 g/dL (ref 3.5–5.2)
ALK PHOS: 74 U/L (ref 39–117)
ALT: 64 U/L — ABNORMAL HIGH (ref 0–35)
AST: 80 U/L — ABNORMAL HIGH (ref 0–37)
BILIRUBIN DIRECT: 0.3 mg/dL (ref 0.0–0.3)
Total Bilirubin: 0.6 mg/dL (ref 0.2–1.2)
Total Protein: 7.9 g/dL (ref 6.0–8.3)

## 2016-09-23 LAB — FERRITIN: Ferritin: 91.5 ng/mL (ref 10.0–291.0)

## 2016-09-23 LAB — PROTIME-INR
INR: 1.3 ratio — ABNORMAL HIGH (ref 0.8–1.0)
PROTHROMBIN TIME: 13.8 s — AB (ref 9.6–13.1)

## 2016-09-23 MED ORDER — HYDROCORTISONE ACETATE 25 MG RE SUPP: 25.0000 mg | Freq: Every day | RECTAL | 11 refills | Status: AC

## 2016-09-23 NOTE — Patient Instructions (Signed)
Your physician has requested that you go to the basement for lab work before leaving today.    We are referring you to  [facility].  They will contact you to schedule an appointment.  If you have not heard from them in the next couple of days, please let us know.    If you are age 53 or older, your body mass index should be between 23-30. Your Body mass index is 31.6 kg/m. If this is out of the aforementioned range listed, please consider follow up with your Primary Care Provider.  If you are age 65 or younger, your body mass index should be between 19-25. Your Body mass index is 31.6 kg/m. If this is out of the aformentioned range listed, please consider follow up with your Primary Care Provider.

## 2016-09-23 NOTE — Progress Notes (Signed)
Subjective:    Patient ID: Emma Glenn, female    DOB: Feb 04, 1966, 51 y.o.   MRN: [id number]  HPI Emma Glenn back is a 50 year old female with past medical history of GERD, Graves' disease status post radioactive iodine therapy, hypertension and subacute issues including elevated liver enzymes, hepatomegaly, weight gain, muscle cramps, and new diabetes who is here for follow-up. She was last seen in the office on 07/24/2016 and for upper endoscopy on 08/08/2016.  To this point we have been evaluating her elevated liver enzymes along with positive ANA and smooth muscle antibody. She went for liver biopsy on 07/31/2016. Liver biopsy showed nonalcoholic fatty liver disease with bridging fibrosis. There is no evidence of iron overload or autoimmune hepatitis by biopsy. Her upper endoscopy revealed mild portal gastropathy. Biopsies negative for H. pylori. No varices seen.  She was referred to rheumatology and they completed an extensive evaluation but found no evidence of rheumatologic condition. They felt like most of her symptoms were hepatic in origin. She did have a positive ANA at 1:320, a positive anti-smooth muscle antibody felt to explain the ANA, elevated ESR at 81, elevated CRP of 18, normal IgG across all subclasses, normal CK 2. Her platelets are normal at 317. AST and ALT remain modestly elevated at 133 and 74 respectively. Serum glucose remains elevated. Bilirubin and alkaline phosphatase are normal. She was found to have an elevated serum ferritin at 173 though her transferrin was normal. This led to hemochromatosis gene analysis which proved homozygous for H63D. she was referred back here for ongoing evaluation.  She has continued to have right upper quadrant abdominal discussion and upper abdominal fullness. Appetite fluctuates and she continues to have significant fatigue. Muscle cramps of been somewhat better but do bother her when she is active. She's had some exertional dyspnea. Nausea  has been a bit of a problem without vomiting. Bowel movements fluctuate from rarely normal to loose stools which are urgent. She's had some rectal pressure which she attributes to internal hemorrhoids. Rare scant blood with wiping. Rectal pressure is only a problem after multiple episodes of loose stools. This is not an issue for her today. Blood sugars have ranged from 220 to 2:30 on average for her at home.  She denies fever and chills. Menstrual cycles have become much more irregular over the last year or so.  Review of Systems As per history of present illness, otherwise negative  Current Medications, Allergies, Past Medical History, Past Surgical History, Family History and Social History were reviewed in [employer] record.     Objective:   Physical Exam BP 112/70   Pulse 99   Ht 5' 1.5" (1.562 m)   Wt 170 lb (77.1 kg)   BMI 31.60 kg/m  Constitutional: Well-developed and well-nourished. No distress. HEENT: Normocephalic and atraumatic. Oropharynx is clear and moist. Conjunctivae are normal.  No scleral icterus. Neck: Neck supple. Trachea midline. Cardiovascular: Normal rate, regular rhythm and intact distal pulses. No M/R/G Pulmonary/chest: Effort normal and breath sounds normal. No wheezing, rales or rhonchi. Abdominal: Soft, significant hepatomegaly which is mildly tender without rebound or guarding, obese, mild distention but not tense, no overt ascites Bowel sounds active throughout. There are no masses palpable.  Extremities: no clubbing, cyanosis, or edema Neurological: Alert and oriented to person place and time. Skin: Skin is warm and dry. Psychiatric: Normal mood and affect. Behavior is normal.  Labs reviewed from rheumatology -- see HPI    Assessment & Plan:  51 year old  female with past medical history of GERD, Graves' disease status post radioactive iodine therapy, hypertension and subacute issues including elevated liver enzymes, hepatomegaly,  weight gain, muscle cramps, and new diabetes who is here for follow-up.  1. Elevated liver enzymes/hepatomegaly/fatigue/fatty liver disease/elevated ANA and anti-smooth muscle antibody/homozygote H63D  Overall this is a very confusing picture particularly in light of her liver biopsy. Liver biopsy showed nonalcoholic steatohepatitis with bridging fibrosis. The iron stain was unremarkable and there was no evidence of autoimmune hepatitis. In contrast to the biopsy results she has positive ANA and anti-smooth muscle antibody, though IgG is normal. She also now has been found to be a homozygote for H63D which can lead to iron overload and hemochromatosis (we discussed together that most people with these genetics do not have iron overload but it is certainly possible. She does not have the classic gene abnormality).  Her ferritin was not elevated nor was her percent iron saturation when I checked it in April however ferritin was elevated greater than 150 when checked in July by rheumatology. I'm going to recheck iron studies today and we discussed how ferritin could be elevated due to an acute phase reactant. Some of her other symptoms would be consistent with iron overload including diabetes and arthropathies. --I'm going to repeat CBC, iron studies, INR today --Urgent referral to [facility] liver clinic for hepatology opinion regarding elevated liver enzymes, biochemical and autoimmune abnormalities and hepatomegaly. They will likely review her liver biopsy   2. Loose stools -- possibly secondary to metformin. Check fecally last Ace and GI pathogen panel. GI pathogen panel was ordered in June but not returned as the symptoms seemed to improve for a short time.  3. Symptomatic internal hemorrhoids -- patient reports she was told she had internal hemorrhoids a colonoscopy last year performed in [place], [place]. Likely exacerbated by #2. See #2. Hydrocortisone suppository 25 mg daily at bedtime 2-3  days. She knows to not remain on this long-term. We re-requested her colonoscopy for completeness and for my review  4. Diabetes -- blood sugar still not adequately controlled. She has endocrinology follow-up later this month.  2-3 month return with me, await [facility] opinion 30 minutes spent with the patient today. Greater than 50% was spent in counseling and coordination of care with the patient

## 2016-09-25 ENCOUNTER — Telehealth: Payer: Self-pay | Admitting: *Deleted

## 2016-09-25 ENCOUNTER — Other Ambulatory Visit: Payer: BLUE CROSS/BLUE SHIELD

## 2016-09-25 DIAGNOSIS — R1011 Right upper quadrant pain: Secondary | ICD-10-CM

## 2016-09-25 DIAGNOSIS — R945 Abnormal results of liver function studies: Secondary | ICD-10-CM

## 2016-09-25 DIAGNOSIS — R195 Other fecal abnormalities: Secondary | ICD-10-CM

## 2016-09-25 DIAGNOSIS — R7989 Other specified abnormal findings of blood chemistry: Secondary | ICD-10-CM

## 2016-09-25 DIAGNOSIS — R197 Diarrhea, unspecified: Secondary | ICD-10-CM

## 2016-09-25 NOTE — Telephone Encounter (Signed)
Dr Hilarie Fredrickson has received patient's GI records from [facility]. He states, "Reviewed colon from 03/2016. 1 hyperplastic sigmoid polyp. Repeat screening 03/2026. JMP"  I have placed a recall in EPIC for colonoscopy 03/2026.

## 2016-09-26 LAB — GASTROINTESTINAL PATHOGEN PANEL PCR
C. DIFFICILE TOX A/B, PCR: NOT DETECTED
CAMPYLOBACTER, PCR: NOT DETECTED
Cryptosporidium, PCR: NOT DETECTED
E COLI (ETEC) LT/ST, PCR: NOT DETECTED
E COLI 0157, PCR: NOT DETECTED
E coli (STEC) stx1/stx2, PCR: NOT DETECTED
Giardia lamblia, PCR: NOT DETECTED
Norovirus, PCR: NOT DETECTED
Rotavirus A, PCR: NOT DETECTED
SALMONELLA, PCR: NOT DETECTED
Shigella, PCR: NOT DETECTED

## 2016-09-27 ENCOUNTER — Telehealth: Payer: Self-pay | Admitting: Internal Medicine

## 2016-09-27 ENCOUNTER — Telehealth: Payer: Self-pay | Admitting: *Deleted

## 2016-09-27 NOTE — Telephone Encounter (Addendum)
Per [facility], [place] wanted patient to be seen by physician on 11/05/16 at 10 am. Patient to be contacted today with this information by [facility].

## 2016-10-03 LAB — PANCREATIC ELASTASE, FECAL

## 2016-10-04 ENCOUNTER — Other Ambulatory Visit: Payer: Self-pay

## 2016-10-04 MED ORDER — DIPHENOXYLATE-ATROPINE 2.5-0.025 MG PO TABS: 1.0000 | ORAL_TABLET | Freq: Four times a day (QID) | ORAL | 2 refills | Status: DC | PRN

## 2016-10-11 ENCOUNTER — Ambulatory Visit (INDEPENDENT_AMBULATORY_CARE_PROVIDER_SITE_OTHER): Payer: BLUE CROSS/BLUE SHIELD | Admitting: Internal Medicine

## 2016-10-11 ENCOUNTER — Encounter: Payer: Self-pay | Admitting: Internal Medicine

## 2016-10-11 VITALS — BP 130/93 | HR 90 | Resp 16 | Wt 169.4 lb

## 2016-10-11 DIAGNOSIS — E1165 Type 2 diabetes mellitus with hyperglycemia: Secondary | ICD-10-CM

## 2016-10-11 LAB — POCT GLYCOSYLATED HEMOGLOBIN (HGB A1C): HEMOGLOBIN A1C: 9.5

## 2016-10-11 MED ORDER — LEVOTHYROXINE SODIUM 150 MCG PO TABS: ORAL_TABLET | ORAL | 1 refills | Status: DC

## 2016-10-11 MED ORDER — METFORMIN HCL ER 500 MG PO TB24: 500.0000 mg | ORAL_TABLET | Freq: Every day | ORAL | 3 refills | Status: DC

## 2016-10-11 MED ORDER — INSULIN GLARGINE 100 UNIT/ML SOLOSTAR PEN: 12.0000 [IU] | PEN_INJECTOR | Freq: Every day | SUBCUTANEOUS | 11 refills | Status: DC

## 2016-10-11 MED ORDER — INSULIN PEN NEEDLE 32G X 4 MM MISC: 3 refills | Status: DC

## 2016-10-11 NOTE — Progress Notes (Signed)
Patient ID: Emma Glenn, female   DOB: 19-Mar-1965, 51 y.o.   MRN: [id number]   HPI: Emma Glenn is a 51 y.o.-year-old female, initially referred by her GI Dr, Dr. Hilarie Fredrickson, for evaluation and tx of high urinary cortisol. She also had very uncontrolled DM2 at last visit and she refused insulin. Last visit 3 months ago.   She lost 10 pounds in the last 2 months!   She had a liver Bx  >> has level 3 scarring. 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  Reviewed and addended history: She started to have severe generalized mm cramps in 03/2016.   Around that time >> RUQ pain >> HIDA scan >> GB w/o clear stones, but with sludge. She had an abd. CT scan: increased size of her liver + liver steatosis.  She was also found to have a 24-hour urine free cortisol, which returned slightly elevated: Component     Latest Ref Rng & Units 06/04/2016 06/14/2016  Cortisol (Ur), Free     4.0 - 50.0 mcg/24 h  57.0 (H)  Results received     0.63 - 2.50 g/24 h  1.27  Cortisol, Plasma     Ug/dL (collected at 5 pm) 6.8    She is not on OCPs.  A CT abdomen (05/2016) showed normal adrenals.  A brain MRI (04/2015) showed normal pituitary gland.  At last visit, we checked a dexamethasone suppression test: normal >> no signs of [place] sd.  Component     Latest Ref Rng & Units 07/02/2016  Cortisol - AM     mcg/dL 1.8 (L)  Dexamethasone, Serum     ng/dL 385    DM2: Before last visit, as her sugars were very high: 463, 396 >> started Metformin.  She refused insulin at last visit! We increased the metformin at that time.  Reviewed HbA1c levels: Labs from [facility], drawn on 07/29/2016: HbA1c still very high, at 12.6% Lab Results  Component Value Date   HGBA1C 12.7 07/02/2016   HGBA1C 5.6 04/28/2015   Pt is on a regimen of: - Metformin 1000 mg 2x a day, with meals >> diarrhea >> want to stop.  Pt was not checking her blood sugars at home at last visit  - now checks 1x a  day: - am: 203-232 - 2h after b'fast: n/c - before lunch: n/c - 2h after lunch: n/c - before dinner: n/c - 2h after dinner: n/c - bedtime: n/c - nighttime: n/c No lows. Lowest sugar was 203; ? If she has hypoglycemia awareness. Highest sugar was 257 (sweet tea).  [name] meals are: - Breakfast: Cereals or 2 eggs and toast - Lunch: Skips - Dinner: Meat and veggies - Snacks: Fruit   Meter: Bayer contour meter at home.  - no CKD, last BUN/creatinine:  Lab Results  Component Value Date   BUN 14 07/31/2016   BUN 25 (H) 07/24/2016   CREATININE 0.93 07/31/2016   CREATININE 1.14 07/24/2016  07/29/2016: UACR was high, 148.9 - last set of lipids: No results found for: CHOL, HDL, LDLCALC, LDLDIRECT, TRIG, CHOLHDL  - no numbness and tingling in her feet.  Pt has FH of DM in father and brother.  She also has hypothyroidism:  Sh is on LT4 200 mcg daily. - in am - fasting - at least 30 min from b'fast - no Ca, Fe, MVI - + Prilosec (!) - not on Biotin  Labs from [facility]: 07/29/2016: Her TSH was  high, at 6.18.  Lab Results  Component Value Date   TSH 4.45 06/04/2016    ROS: Constitutional: + Both weight gain/weight loss, + fatigue, no subjective hyperthermia, no subjective hypothermia Eyes: no blurry vision, no xerophthalmia ENT: no sore throat, no nodules palpated in throat, no dysphagia, no odynophagia, no hoarseness Cardiovascular: no CP/+ SOB/no palpitations/no leg swelling Respiratory: no cough/+ SOB/no wheezing Gastrointestinal: + N/no V/+ D/no C/no acid reflux Musculoskeletal: no muscle aches/no joint aches Skin: no rashes, + hair loss Neurological: no tremors/no numbness/no tingling/no dizziness  I reviewed pt's medications, allergies, PMH, social hx, family hx, and changes were documented in the history of present illness. Otherwise, unchanged from my initial visit note.   Past Medical History:  Diagnosis Date  . CIN I (cervical intraepithelial neoplasia I)   .  COPD (chronic obstructive pulmonary disease) ([place])   . GERD (gastroesophageal reflux disease)   . Hepatic steatosis   . Hypertension   . Hypothyroidism   . Labial cyst    Inclusion cyst  . Positive ANA (antinuclear antibody)   . Seizures ([place])    last seizure 3/17? over dose wellbutrin  . Thyroid disease    Graves dis-Radioactive Iodine   Past Surgical History:  Procedure Laterality Date  . CERVICAL BIOPSY  W/ LOOP ELECTRODE EXCISION     Excision labial inclusion cyst  . CESAREAN SECTION    . COLPOSCOPY    . HAND SURGERY    . SHOULDER ARTHROSCOPY WITH OPEN ROTATOR CUFF REPAIR AND DISTAL CLAVICLE ACROMINECTOMY Left 06/13/2015   Procedure: LEFT SHOULDER ARTHROSCOPY, DEBRIDEMENT, OPEN ROTATOR CUFF REPAIR, CORACOID FRACTURE FIXATION, BICEPS TENODESIS;  Surgeon: Meredith Pel, MD;  Location: [facility];  Service: Orthopedics;  Laterality: Left;  . SHOULDER CLOSED REDUCTION Left 09/26/2015   Procedure: CLOSED MANIPULATION SHOULDER UNDER ANESTHESIA;  Surgeon: Meredith Pel, MD;  Location: [facility];  Service: Orthopedics;  Laterality: Left;  . TUBAL LIGATION     Social History   Social History  . Marital status: Married    Spouse name: Tommie Raymond  . Number of children: 1  . Years of education: 79   Occupational History  . Material handler    Social History Main Topics  . Smoking status: Former Smoker    Packs/day: 1.00    Types: Cigarettes    Quit date: 02/14/2015  . Smokeless tobacco: Never Used  . Alcohol use 0.0 oz/week     Comment: rare  . Drug use: No     Comment: Hx of marijuana use  . Sexual activity: Yes    Birth control/ protection: Surgical   Social History Narrative   Lives with husband and sister   Caffeine use: Tea/coffee daily      Current Outpatient Prescriptions on File Prior to Visit  Medication Sig Dispense Refill  . diphenoxylate-atropine (LOMOTIL) 2.5-0.025 MG tablet Take 1 tablet by mouth 4 (four) times daily as needed for diarrhea or loose stools. 90  tablet 2  . glucose blood (BAYER CONTOUR TEST) test strip Use 2x a day 200 each 3  . Glycopyrrolate-Formoterol (BEVESPI AEROSPHERE) 9-4.8 MCG/ACT AERO Inhale 2 puffs into the lungs 2 (two) times daily. 10.7 g 6  . levothyroxine (SYNTHROID, LEVOTHROID) 200 MCG tablet     . losartan (COZAAR) 50 MG tablet Take 50 mg by mouth daily.    . metFORMIN (GLUCOPHAGE) 500 MG tablet Take 2,000 mg by mouth daily. Patient takes two 562m tabs in the morning and two 5053mtabs at night    .  omeprazole (PRILOSEC) 40 MG capsule Take 40 mg by mouth daily.     Current Facility-Administered Medications on File Prior to Visit  Medication Dose Route Frequency Provider Last Rate Last Dose  . 0.9 %  sodium chloride infusion  500 mL Intravenous Continuous Pyrtle, Lajuan Lines, MD       Allergies  Allergen Reactions  . Doxycycline Other (See Comments)    Flu like symptoms (high fever, chills)  . Nitrofurantoin Monohyd Macro Other (See Comments)    Flu like symptoms (high fever, chills)  . Septra [Sulfamethoxazole-Trimethoprim] Other (See Comments)    Flu like symptoms (high fever, chills)  . Lamictal [Lamotrigine] Rash   Family History  Problem Relation Age of Onset  . Hypertension Mother   . Heart disease Mother   . Diabetes Father   . Heart disease Father   . Heart disease Brother   . Breast cancer Maternal Grandmother   . Seizures Neg Hx   . Stomach cancer Neg Hx   . Colon cancer Neg Hx    PE: BP (!) 130/93   Pulse 90   Resp 16   Wt 169 lb 6.4 oz (76.8 kg)   SpO2 99%   BMI 31.49 kg/m  Wt Readings from Last 3 Encounters:  10/11/16 169 lb 6.4 oz (76.8 kg)  09/23/16 170 lb (77.1 kg)  08/08/16 179 lb (81.2 kg)   Constitutional: overweight, in NAD Eyes: PERRLA, EOMI, no exophthalmos ENT: moist mucous membranes, no thyromegaly, no cervical lymphadenopathy Cardiovascular: Tachycardia, RR, No MRG Respiratory: CTA B Gastrointestinal: abdomen soft, NT, ND, BS+ Musculoskeletal: no deformities, strength  intact in all 4 Skin: moist, warm, no rashes Neurological: no tremor with outstretched hands, DTR normal in all 4  ASSESSMENT: 1. DM2   2. Hypothyroidism   PLAN: 1. DM2 Patient with long-standing, uncontrolled diabetes, on oral antidiabetic regimen, which became insufficient.At last visit, she only wanted to increase the metformin and refused addition of insulin despite very high blood sugars and HbA1c. However, at this visit, she tells me that she has diarrhea from metformin and would like to stop the medication. - Since last visit, she started to check her CBGs at home and they appear better, but she only checks in the morning. -  An HbA1c obtained today is indeed, better, at 9.5%  -  we again discussed about starting basal insulin to improve her sugars and hopefully will be able to decrease this as her sugars improve >> She agrees with this today  -  I demonstrated insulin pen use and discussed about correct injection techniques  - will stop the regular metformin and will try to add a lower dose of metformin extended-release  - I suggested to:  Patient Instructions  Please change to: - Metformin ER 500 mg 2x a day  Please start: - Lantus 12 units at bedtime. Increase by 3 units every 3 days to get the am sugars <130 (stop at 30 units).  When injecting insulin:  Inject in the abdomen  Rotate the injection sites around the belly button  Change needle for each injection  Keep needle in for 10 sec after last unit of insulin in  Keep the insulin in use out of the fridge  Please return in 1.5 months with your sugar log.   - today, HbA1c is 9.5% (better) - continue checking sugars at different times of the day - check 2x a day, rotating checks - Return to clinic in 1.5 mo with sugar log  2. Hypothyroidism - uncontrolled   -  reviewed latest TSH obtained by PCP: 6.18, high - Upon questioning, patient is not taking her levothyroxine correctly: She takes it along with her  Prilosec.  this is probably the reason why her levothyroxine dose is so high (200 g daily) -  at this visit, we discussed about correct intake of levothyroxine: Take the thyroid hormone every day, with water, at least 30 minutes before breakfast, separated by at least 4 hours from: - acid reflux medications - calcium - iron - multivitamins - I advised her to move Prilosec at least 4 hours after levothyroxine - As she will now start to absorb the entire dose of levothyroxine, we will decrease the dose to 150 g daily -  I will recheck her TFTs when she comes back in 1.5 months  - time spent with the patient: 40 min, of which >50% was spent in obtaining information about her symptoms, reviewing her previous labs, evaluations, and treatments, counseling her about her condition (please see the discussed topics above), and developing a plan to further investigate it; she had a number of questions which I addressed.   Philemon Kingdom, MD PhD [facility]

## 2016-10-11 NOTE — Patient Instructions (Addendum)
Please change to: - Metformin ER 500 mg 2x a day  Please start: - Lantus 12 units at bedtime. Increase by 3 units every 3 days to get the am sugars <130 (stop at 30 units).  When injecting insulin:  Inject in the abdomen  Rotate the injection sites around the belly button  Change needle for each injection  Keep needle in for 10 sec after last unit of insulin in  Keep the insulin in use out of the fridge  Please decrease the Levothyroxine dose to 150 mcg daily.  Take the thyroid hormone every day, with water, at least 30 minutes before breakfast, separated by at least 4 hours from: - acid reflux medications - calcium - iron - multivitamins  Please return in 1.5 months with your sugar log.

## 2016-10-17 ENCOUNTER — Telehealth: Payer: Self-pay | Admitting: Internal Medicine

## 2016-10-17 NOTE — Telephone Encounter (Signed)
Discussed with pt that there are no openings on the date she requested. Pt wants to be notified if there are cancellations. Pt scheduled for 12/10/16@1 :45pm. Pt aware of appt.

## 2016-10-29 ENCOUNTER — Telehealth: Payer: Self-pay | Admitting: Internal Medicine

## 2016-10-29 ENCOUNTER — Other Ambulatory Visit: Payer: Self-pay

## 2016-10-29 MED ORDER — METFORMIN HCL ER 500 MG PO TB24: ORAL_TABLET | ORAL | 3 refills | Status: DC

## 2016-10-29 NOTE — Telephone Encounter (Signed)
Patient called to advise that she is out metFORMIN (GLUCOPHAGE-XR) 500 MG 24 hr tablet. Patient accidentally took 2 a day instead of 1 a day. Pharmacy will not fill because it is too early. Call pharmacy to advise and update patient. [place], [place] - [phone] (Phone) [phone] (Fax)

## 2016-10-29 NOTE — Telephone Encounter (Signed)
Let's send a Rx for 2x a day

## 2016-10-29 NOTE — Telephone Encounter (Signed)
Submitted the rx for 2 times daily.

## 2016-10-29 NOTE — Telephone Encounter (Signed)
Called and advised patient that new RX for 2 times daily was submitted into pharmacy.

## 2016-10-29 NOTE — Telephone Encounter (Signed)
How to advise patient? If she took 2 daily instead of 1 daily. Insurance may not cover it early.? Please advise. Thanks!

## 2016-10-31 ENCOUNTER — Telehealth: Payer: Self-pay | Admitting: Internal Medicine

## 2016-10-31 ENCOUNTER — Other Ambulatory Visit: Payer: Self-pay

## 2016-10-31 MED ORDER — INSULIN GLARGINE 100 UNIT/ML SOLOSTAR PEN: 12.0000 [IU] | PEN_INJECTOR | Freq: Every day | SUBCUTANEOUS | 11 refills | Status: DC

## 2016-10-31 NOTE — Telephone Encounter (Signed)
MEDICATION: Insulin Glargine (LANTUS SOLOSTAR) 100 UNIT/ML Solostar Pen  PHARMACY:   [place], [place] - [address] [phone] (Phone) [phone] (Fax)     IS THIS A 90 DAY SUPPLY : NO  IS PATIENT OUT OF MEDICATION: NO  IF NOT; HOW MUCH IS LEFT: 70 UNITS- 2 DAYS  LAST APPOINTMENT DATE: 10/11/16  NEXT APPOINTMENT DATE:@11 /03/2016  OTHER COMMENTS: Patient states that it was filled incorrectly for 12 units and she has increased per Dr. Arman Filter instructions to 30 units. Please correct this RX.    **Let patient know to contact pharmacy at the end of the day to make sure medication is ready. **  ** Please notify patient to allow 48-72 hours to process**  **Encourage patient to contact the pharmacy for refills or they can request refills through [facility]**

## 2016-10-31 NOTE — Telephone Encounter (Signed)
Submitted into pharmacy.

## 2016-11-01 ENCOUNTER — Other Ambulatory Visit: Payer: Self-pay

## 2016-11-01 ENCOUNTER — Telehealth: Payer: Self-pay | Admitting: Internal Medicine

## 2016-11-01 MED ORDER — INSULIN GLARGINE 100 UNIT/ML SOLOSTAR PEN: PEN_INJECTOR | SUBCUTANEOUS | 1 refills | Status: DC

## 2016-11-01 NOTE — Telephone Encounter (Signed)
Pharmacy states that the dosage for the Insulin Glargine (LANTUS SOLOSTAR) 100 UNIT/ML Solostar Pen was to be increased however it was not. Patient will be out of the medication by the end of today. Please call to correct.  [place], [place] - [phone] (Phone) [phone] (Fax)

## 2016-11-01 NOTE — Telephone Encounter (Signed)
Submitted with the sliding scale dose.

## 2016-11-07 ENCOUNTER — Encounter: Payer: Self-pay | Admitting: Internal Medicine

## 2016-11-07 ENCOUNTER — Other Ambulatory Visit: Payer: Self-pay | Admitting: Nurse Practitioner

## 2016-11-07 DIAGNOSIS — K7469 Other cirrhosis of liver: Secondary | ICD-10-CM

## 2016-11-11 ENCOUNTER — Ambulatory Visit: Payer: BLUE CROSS/BLUE SHIELD | Admitting: Internal Medicine

## 2016-11-12 ENCOUNTER — Ambulatory Visit
Admission: RE | Admit: 2016-11-12 | Discharge: 2016-11-12 | Disposition: A | Discharge status: Home / Self Care | Payer: BLUE CROSS/BLUE SHIELD | Source: Ambulatory Visit | Attending: Nurse Practitioner | Admitting: Nurse Practitioner

## 2016-11-12 DIAGNOSIS — K7469 Other cirrhosis of liver: Secondary | ICD-10-CM

## 2016-12-04 NOTE — Telephone Encounter (Signed)
Note made in error

## 2016-12-10 ENCOUNTER — Other Ambulatory Visit (INDEPENDENT_AMBULATORY_CARE_PROVIDER_SITE_OTHER): Payer: BLUE CROSS/BLUE SHIELD

## 2016-12-10 ENCOUNTER — Encounter: Payer: Self-pay | Admitting: Internal Medicine

## 2016-12-10 ENCOUNTER — Ambulatory Visit (INDEPENDENT_AMBULATORY_CARE_PROVIDER_SITE_OTHER): Payer: BLUE CROSS/BLUE SHIELD | Admitting: Internal Medicine

## 2016-12-10 VITALS — BP 112/76 | HR 82 | Ht 62.0 in | Wt 169.1 lb

## 2016-12-10 DIAGNOSIS — R1011 Right upper quadrant pain: Secondary | ICD-10-CM

## 2016-12-10 DIAGNOSIS — R160 Hepatomegaly, not elsewhere classified: Secondary | ICD-10-CM

## 2016-12-10 DIAGNOSIS — R760 Raised antibody titer: Secondary | ICD-10-CM

## 2016-12-10 DIAGNOSIS — K7581 Nonalcoholic steatohepatitis (NASH): Secondary | ICD-10-CM

## 2016-12-10 LAB — COMPREHENSIVE METABOLIC PANEL
ALT: 70 U/L — AB (ref 0–35)
AST: 104 U/L — AB (ref 0–37)
Albumin: 4.3 g/dL (ref 3.5–5.2)
Alkaline Phosphatase: 71 U/L (ref 39–117)
BILIRUBIN TOTAL: 0.7 mg/dL (ref 0.2–1.2)
BUN: 12 mg/dL (ref 6–23)
CO2: 27 meq/L (ref 19–32)
CREATININE: 0.64 mg/dL (ref 0.40–1.20)
Calcium: 10.2 mg/dL (ref 8.4–10.5)
Chloride: 99 mEq/L (ref 96–112)
GFR: 103.22 mL/min (ref >60.00)
GLUCOSE: 276 mg/dL — AB (ref 70–99)
Potassium: 4.1 mEq/L (ref 3.5–5.1)
Sodium: 137 mEq/L (ref 135–145)
Total Protein: 8.5 g/dL — ABNORMAL HIGH (ref 6.0–8.3)

## 2016-12-10 LAB — FERRITIN: FERRITIN: 47.9 ng/mL (ref 10.0–291.0)

## 2016-12-10 LAB — CBC WITH DIFFERENTIAL/PLATELET
BASOS ABS: 0.1 10*3/uL (ref 0.0–0.1)
Basophils Relative: 1 % (ref 0.0–3.0)
EOS PCT: 3.2 % (ref 0.0–5.0)
Eosinophils Absolute: 0.3 10*3/uL (ref 0.0–0.7)
HCT: 38.2 % (ref 36.0–46.0)
Hemoglobin: 12.5 g/dL (ref 12.0–15.0)
LYMPHS PCT: 27.1 % (ref 12.0–46.0)
Lymphs Abs: 2.6 10*3/uL (ref 0.7–4.0)
MCHC: 32.8 g/dL (ref 30.0–36.0)
MCV: 92.4 fl (ref 78.0–100.0)
MONOS PCT: 6.1 % (ref 3.0–12.0)
Monocytes Absolute: 0.6 10*3/uL (ref 0.1–1.0)
NEUTROS PCT: 62.6 % (ref 43.0–77.0)
Neutro Abs: 6 10*3/uL (ref 1.4–7.7)
PLATELETS: 292 10*3/uL (ref 150.0–400.0)
RBC: 4.13 Mil/uL (ref 3.87–5.11)
RDW: 14.3 % (ref 11.5–15.5)
WBC: 9.6 10*3/uL (ref 4.0–10.5)

## 2016-12-10 LAB — PROTIME-INR
INR: 1.3 ratio — ABNORMAL HIGH (ref 0.8–1.0)
Prothrombin Time: 14.1 s — ABNORMAL HIGH (ref 9.6–13.1)

## 2016-12-10 MED ORDER — DICYCLOMINE HCL 20 MG PO TABS: 20.0000 mg | ORAL_TABLET | Freq: Three times a day (TID) | ORAL | 2 refills | Status: DC | PRN

## 2016-12-10 NOTE — Patient Instructions (Addendum)
We have sent the following medications to your pharmacy for you to pick up at your convenience: Bentyl 20 mg three times daiyl as needed for increased abdominal pain, spasm  Your physician has requested that you go to the basement for the following lab work before leaving today: CBC, CMP, INR, ferritin  Please follow up with Dr Hilarie Fredrickson on Tuesday, 02/25/17 at 2:30 pm  If you are age 53 or older, your body mass index should be between 23-30. Your Body mass index is 30.93 kg/m. If this is out of the aforementioned range listed, please consider follow up with your Primary Care Provider.  If you are age 70 or younger, your body mass index should be between 19-25. Your Body mass index is 30.93 kg/m. If this is out of the aformentioned range listed, please consider follow up with your Primary Care Provider.

## 2016-12-10 NOTE — Progress Notes (Signed)
Subjective:    Patient ID: Emma Glenn, female    DOB: 1965/12/16, 51 y.o.   MRN: [id number]  HPI Vita Currin is a 51 year old female with a history of NASH with advanced fibrosis, GERD, Graves' disease status post radioactive iodine, hypertension and diabetes who is here for follow-up. She was last seen on 09/23/2016.  Since her last visit with me she was seen by Dr. Zollie Scale with [facility] hepatology. Additional testing revealed the possibility of this metabolic iron overload syndrome with a minor variant for hemachromatosis. Phlebotomy was recommended. They also felt that her liver fibrosis was secondary to nonalcoholic steatohepatitis and not consistent with autoimmune hepatitis despite her history of positive ANA and mild elevation in smooth muscle antibody. Their plan was to have her pathology reviewed in [place].  She was seen also by rheumatology for her positive ANA and evaluation for rheumatologic diseases. She saw one of the advanced practitioners with Dr. Amil Amen. I'm not aware of a new diagnosis from rheumatology standpoint.  She has continued to have intermittent right upper quadrant abdominal pain. This is crampy in nature. It seems to be worse with movement. It is inconsistent. It is worse with twisting or bending to pick something up. She will feel that something is "catching" in her right upper quadrant. Doesn't seem to relate to eating or bowel movement. Also bothers her when she stands from a seated position. She's has loose stools which she relates to metformin. No blood in her stool or melena. No lower extremity edema or increasing abdominal girth. She continued to have significant fatigue. No jaundice. No confusion.  She has endocrine follow-up later this week. Previously elevated urine free cortisol was elevated but her dexamethasone suppression test was negative for Cushing's. She has worked with endocrinology on blood glucose control.  She had an upper endoscopy on 08/08/2016  which showed a 2 cm hiatal hernia. No varices. Mild portal gastropathy in the proximal stomach.  She saw dermatology and had a negative skin check  Review of Systems As per history of present illness, otherwise negative  Current Medications, Allergies, Past Medical History, Past Surgical History, Family History and Social History were reviewed in [employer] record.     Objective:   Physical Exam BP 112/76   Pulse 82   Ht 5' 2"  (1.575 m)   Wt 169 lb 2 oz (76.7 kg)   BMI 30.93 kg/m  Constitutional: Well-developed and well-nourished. No distress. HEENT: Normocephalic and atraumatic. Oropharynx is clear and moist. Conjunctivae are normal.  No scleral icterus. Neck: Neck supple. Trachea midline. Cardiovascular: Normal rate, regular rhythm and intact distal pulses. No M/R/G Pulmonary/chest: Effort normal and breath sounds normal. No wheezing, rales or rhonchi. Abdominal: Soft,obese, mild hepatomegaly, no appreciable splenomegaly, right upper quadrant tenderness, nondistended. Bowel sounds active throughout.  Extremities: no clubbing, cyanosis, or edema Neurological: Alert and oriented to person place and time. Skin: Skin is warm and dry. Psychiatric: Normal mood and affect. Behavior is normal.  CBC    Component Value Date/Time   WBC 8.7 09/23/2016 1457   RBC 4.06 09/23/2016 1457   HGB 12.3 09/23/2016 1457   HCT 37.5 09/23/2016 1457   PLT 294.0 09/23/2016 1457   MCV 92.5 09/23/2016 1457   MCH 30.4 07/31/2016 1119   MCHC 32.8 09/23/2016 1457   RDW 17.2 (H) 09/23/2016 1457   LYMPHSABS 2.3 09/23/2016 1457   MONOABS 0.7 09/23/2016 1457   EOSABS 0.3 09/23/2016 1457   BASOSABS 0.1 09/23/2016 1457  CMP     Component Value Date/Time   NA 139 07/31/2016 1119   NA 137 06/25/2016 1347   K 4.0 07/31/2016 1119   CL 104 07/31/2016 1119   CO2 22 07/31/2016 1119   GLUCOSE 216 (H) 07/31/2016 1119   BUN 14 07/31/2016 1119   BUN 14 06/25/2016 1347   CREATININE  0.93 07/31/2016 1119   CALCIUM 9.3 07/31/2016 1119   PROT 7.9 09/23/2016 1457   ALBUMIN 4.5 09/23/2016 1457   AST 80 (H) 09/23/2016 1457   ALT 64 (H) 09/23/2016 1457   ALKPHOS 74 09/23/2016 1457   BILITOT 0.6 09/23/2016 1457   GFRNONAA >60 07/31/2016 1119   GFRAA >60 07/31/2016 1119   Lab Results  Component Value Date   INR 1.3 (H) 09/23/2016   INR 1.04 07/31/2016   INR 1.3 (H) 07/24/2016   Iron/TIBC/Ferritin/ %Sat    Component Value Date/Time   IRON 62 09/23/2016 1457   FERRITIN 91.5 09/23/2016 1457   IRONPCTSAT 12.8 (L) 09/23/2016 1457     ULTRASOUND ABDOMEN LIMITED RIGHT UPPER QUADRANT   COMPARISON:  CT abdomen pelvis of 05/29/2016   FINDINGS: Gallbladder:   The gallbladder is visualized and no gallstones are noted. There is no pain over the gallbladder with compression.   Common bile duct:   Diameter: The common bile duct is normal measuring 3.9 mm in diameter.   Liver:   The liver parenchyma is echogenic and inhomogeneous consistent with fatty infiltration. The contours the liver are not particularly nodular. No focal hepatic abnormality is seen. The liver may be slightly enlarged. Portal vein is patent on color Doppler imaging with normal direction of blood flow towards the liver.   IMPRESSION: 1. No gallstones. 2. Inhomogeneous echogenic liver parenchyma consistent with diffuse fatty infiltration. No focal hepatic abnormality. Question mild hepatomegaly.     Electronically Signed   By: Ivar Drape M.D.   On: 11/12/2016 13:26       Assessment & Plan:  51 year old female with a history of NASH with advanced fibrosis, GERD, Graves' disease status post radioactive iodine, hypertension and diabetes who is here for follow-up  1. NASH/possible iron overload -- she has fatty liver disease by biopsy. She had a very positive ANA and also positive smooth muscle antibody. Our pathologists did not feel that this was consistent with autoimmune hepatitis nor  did Dr. Zollie Scale with transplant hepatology/[facility]. I really appreciate their input and help with her care. She attempted to donate blood recently but was denied due to a hemoglobin of 12.3. When last checked here her ferritin was less than 100. I will recheck ferritin and iron studies today. If less than 100 she likely does not need phlebotomy at this time. --Fibrosis is advanced, bridging by biopsy. She did have mild portal gastropathy in her stomach suggestive of portal hypertension. --Recent liver ultrasound did not show lesions concerning for HCC, repeat in 6 months --No varices by upper endoscopy in June 2018, repeat in 2-3 years --I will follow-up with Dr. Zollie Scale or Windell Hummingbird re: liver pathology review in [place] --Repeat CBC, CMP and INR today along with iron studies --Three-month follow-up  2. Right upper quadrant abdominal pain -- no gallstones by ultrasound. Possibly this is secondary to liver capsule pain due to hepatomegaly and NASH. Will give her Bentyl 20 mg 3 times a day when necessary in case this pain is from bowel spasm. Controlling NASH risk factors and reducing fatty liver would be expected to improve this pain.  3.  Loose stools -- likely secondary to metformin. Bentyl may help with this symptom.  4. Diabetes -- has endocrinology follow-up later this week  She inquires about long-term disability which I think she would meet criteria for. She will send paperwork.  25 minutes spent with the patient today. Greater than 50% was spent in counseling and coordination of care with the patient

## 2016-12-13 ENCOUNTER — Ambulatory Visit (INDEPENDENT_AMBULATORY_CARE_PROVIDER_SITE_OTHER): Payer: BLUE CROSS/BLUE SHIELD | Admitting: Internal Medicine

## 2016-12-13 ENCOUNTER — Encounter: Payer: Self-pay | Admitting: Internal Medicine

## 2016-12-13 VITALS — BP 126/72 | HR 93 | Temp 98.0°F | Ht 62.0 in | Wt 168.6 lb

## 2016-12-13 DIAGNOSIS — E038 Other specified hypothyroidism: Secondary | ICD-10-CM | POA: Diagnosis not present

## 2016-12-13 DIAGNOSIS — Z23 Encounter for immunization: Secondary | ICD-10-CM | POA: Diagnosis not present

## 2016-12-13 DIAGNOSIS — E1165 Type 2 diabetes mellitus with hyperglycemia: Secondary | ICD-10-CM | POA: Diagnosis not present

## 2016-12-13 LAB — LIPID PANEL
CHOL/HDL RATIO: 11.3 (calc) — AB (ref <5.0)
CHOLESTEROL: 180 mg/dL (ref <200)
HDL: 16 mg/dL — AB (ref >50)
LDL Cholesterol (Calc): 131 mg/dL (calc) — ABNORMAL HIGH
Non-HDL Cholesterol (Calc): 164 mg/dL (calc) — ABNORMAL HIGH (ref <130)
Triglycerides: 190 mg/dL — ABNORMAL HIGH (ref <150)

## 2016-12-13 MED ORDER — DULAGLUTIDE 0.75 MG/0.5ML ~~LOC~~ SOAJ: SUBCUTANEOUS | 1 refills | Status: DC

## 2016-12-13 NOTE — Progress Notes (Addendum)
Patient ID: Emma Glenn, female   DOB: [birth year], 51 y.o.   MRN: [id number]   HPI: Emma Glenn is a 51 y.o.-year-old female, initially referred by her GI Dr, Dr. Hilarie Fredrickson, for evaluation and tx of high urinary cortisol. She also had very uncontrolled DM2, insulin dependent (but refused insulin in the past) and hypothyroidism. Last visit 2 mo ago.  DM2: Reviewed HbA1c levels: Lab Results  Component Value Date   HGBA1C 9.5 10/11/2016   HGBA1C 12.7 07/02/2016   HGBA1C 5.6 04/28/2015  Labs from [place], drawn on 07/29/2016: HbA1c still very high, at 12.6%  Pt is on a regimen of: - Metformin 1000 mg 2x a day, with meals >> diarrhea >> Metformin ER 500 mg 2x a day >> still diarrhea >> saw Dr. Hilarie Fredrickson >> started Dicyclomine - Lantus 12 >> 30 units at bedtime  Pt checks sugars 1x a day: - am: 203-232 >> 152-239, 254 - 2h after b'fast: n/c - before lunch: n/c - 2h after lunch: n/c - before dinner: n/c - 2h after dinner: n/c - bedtime: n/c - nighttime: n/c Lowest sugar was 203 >> 152; ? If she has hypoglycemia awareness. Highest sugar was 257 (sweet tea) >> 254  [name] meals are: - Breakfast: Cereals or 2 eggs and toast - Lunch: Skips - Dinner: Meat and veggies - Snacks: Fruit   Meter: Bayer contour meter at home.  - No CKD, last BUN/creatinine:  Lab Results  Component Value Date   BUN 12 12/10/2016   BUN 14 07/31/2016   CREATININE 0.64 12/10/2016   CREATININE 0.93 07/31/2016  07/29/2016: UACR was high, 148.9 - last set of lipids: No results found for: CHOL, HDL, LDLCALC, LDLDIRECT, TRIG, CHOLHDL - last eye exam: 01/2015 - No numbness and tingling in her feet.  Pt has FH of DM in father and brother.  She also has hypothyroidism:  Sh is on LT4 200 >> 150 mcg daily: - in am - fasting - at least 30 min from b'fast - no Ca, Fe, MVI, PPIs - not on Biotin - now Prilosec moved later  Labs from [place]:  07/29/2016: Her TSH was high, at 6.18.  Lab Results  Component Value  Date   TSH 4.45 06/04/2016   Reviewed hx: She started to have severe generalized mm cramps in 03/2016.   Around that time >> RUQ pain >> HIDA scan >> GB w/o clear stones, but with sludge. She had an abd. CT scan: increased size of her liver + liver steatosis.  She was also found to have a 24-hour urine free cortisol, which returned slightly elevated: Component     Latest Ref Rng & Units 06/04/2016 06/14/2016  Cortisol (Ur), Free     4.0 - 50.0 mcg/24 h  57.0 (H)  Results received     0.63 - 2.50 g/24 h  1.27  Cortisol, Plasma     Ug/dL (collected at 5 pm) 6.8    She is not on OCPs.  A CT abdomen (05/2016) showed normal adrenals.  A brain MRI (04/2015) showed normal pituitary gland.  At last visit, we checked a dexamethasone suppression test: normal >> no signs of Cushing sd.  Component     Latest Ref Rng & Units 07/02/2016  Cortisol - AM     mcg/dL 1.8 (L)  Dexamethasone, Serum     ng/dL 385    She had a liver Bx  >> has level 3 scarring. 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  ROS: Constitutional: no weight gain/no weight loss, + fatigue, no subjective hyperthermia, no subjective hypothermia Eyes: no blurry vision, no xerophthalmia ENT: no sore throat, no nodules palpated in throat, no dysphagia, no odynophagia, no hoarseness Cardiovascular: no CP/no SOB/no palpitations/no leg swelling Respiratory: no cough/no SOB/no wheezing Gastrointestinal: + N/no V/+ D/no C/no acid reflux Musculoskeletal: no muscle aches/no joint aches Skin: no rashes, + hair loss Neurological: no tremors/no numbness/no tingling/no dizziness  I reviewed pt's medications, allergies, PMH, social hx, family hx, and changes were documented in the history of present illness. Otherwise, unchanged from my initial visit note.  Past Medical History:  Diagnosis Date  . CIN I (cervical intraepithelial neoplasia I)   . COPD (chronic obstructive pulmonary disease) ([place])   .  GERD (gastroesophageal reflux disease)   . Hepatic steatosis   . Hypertension   . Hypothyroidism   . Labial cyst    Inclusion cyst  . Positive ANA (antinuclear antibody)   . Seizures ([place])    last seizure 3/17? over dose wellbutrin  . Thyroid disease    Graves dis-Radioactive Iodine   Past Surgical History:  Procedure Laterality Date  . CERVICAL BIOPSY  W/ LOOP ELECTRODE EXCISION     Excision labial inclusion cyst  . CESAREAN SECTION    . COLPOSCOPY    . HAND SURGERY    . SHOULDER ARTHROSCOPY WITH OPEN ROTATOR CUFF REPAIR AND DISTAL CLAVICLE ACROMINECTOMY Left 06/13/2015   Procedure: LEFT SHOULDER ARTHROSCOPY, DEBRIDEMENT, OPEN ROTATOR CUFF REPAIR, CORACOID FRACTURE FIXATION, BICEPS TENODESIS;  Surgeon: Meredith Pel, MD;  Location: [facility];  Service: Orthopedics;  Laterality: Left;  . SHOULDER CLOSED REDUCTION Left 09/26/2015   Procedure: CLOSED MANIPULATION SHOULDER UNDER ANESTHESIA;  Surgeon: Meredith Pel, MD;  Location: [facility];  Service: Orthopedics;  Laterality: Left;  . TUBAL LIGATION     Social History   Social History  . Marital status: Married    Spouse name: Tommie Raymond  . Number of children: 1  . Years of education: 37   Occupational History  . Material handler    Social History Main Topics  . Smoking status: Former Smoker    Packs/day: 1.00    Types: Cigarettes    Quit date: 02/14/2015  . Smokeless tobacco: Never Used  . Alcohol use 0.0 oz/week     Comment: rare  . Drug use: No     Comment: Hx of marijuana use  . Sexual activity: Yes    Birth control/ protection: Surgical   Social History Narrative   Lives with husband and sister   Caffeine use: Tea/coffee daily      Current Outpatient Prescriptions on File Prior to Visit  Medication Sig Dispense Refill  . dicyclomine (BENTYL) 20 MG tablet Take 1 tablet (20 mg total) by mouth 3 (three) times daily as needed for spasms (increased abdominal pain). 90 tablet 2  . diphenoxylate-atropine (LOMOTIL)  2.5-0.025 MG tablet Take 1 tablet by mouth 4 (four) times daily as needed for diarrhea or loose stools. 90 tablet 2  . glucose blood (BAYER CONTOUR TEST) test strip Use 2x a day 200 each 3  . Glycopyrrolate-Formoterol (BEVESPI AEROSPHERE) 9-4.8 MCG/ACT AERO Inhale 2 puffs into the lungs 2 (two) times daily. 10.7 g 6  . Insulin Glargine (LANTUS SOLOSTAR) 100 UNIT/ML Solostar Pen - Lantus 12 units at bedtime. Increase by 3 units every 3 days to get the am sugars <130 (stop at 30 units). 15 pen 1  . Insulin Pen Needle (  CAREFINE PEN NEEDLES) 32G X 4 MM MISC Use 1x a day 100 each 3  . levothyroxine (SYNTHROID, LEVOTHROID) 150 MCG tablet Take 15o mcg daily in am 60 tablet 1  . losartan (COZAAR) 50 MG tablet Take 50 mg by mouth daily.    . metFORMIN (GLUCOPHAGE-XR) 500 MG 24 hr tablet Take 2 tablets daily 180 tablet 3  . omeprazole (PRILOSEC) 40 MG capsule Take 40 mg by mouth daily.     Current Facility-Administered Medications on File Prior to Visit  Medication Dose Route Frequency Provider Last Rate Last Dose  . 0.9 %  sodium chloride infusion  500 mL Intravenous Continuous Pyrtle, Lajuan Lines, MD       Allergies  Allergen Reactions  . Doxycycline Other (See Comments)    Flu like symptoms (high fever, chills)  . Nitrofurantoin Monohyd Macro Other (See Comments)    Flu like symptoms (high fever, chills)  . Septra [Sulfamethoxazole-Trimethoprim] Other (See Comments)    Flu like symptoms (high fever, chills)  . Lamictal [Lamotrigine] Rash   Family History  Problem Relation Age of Onset  . Hypertension Mother   . Heart disease Mother   . Diabetes Father   . Heart disease Father   . Heart disease Brother   . Breast cancer Maternal Grandmother   . Seizures Neg Hx   . Stomach cancer Neg Hx   . Colon cancer Neg Hx    PE: BP 126/72 (BP Location: Right Arm, Patient Position: Sitting, Cuff Size: Normal)   Pulse 93   Temp 98 F (36.7 C) (Oral)   Ht 5' 2"  (1.575 m)   Wt 168 lb 9.3 oz (76.5 kg)    SpO2 95%   BMI 30.83 kg/m  Wt Readings from Last 3 Encounters:  12/13/16 168 lb 9.3 oz (76.5 kg)  12/10/16 169 lb 2 oz (76.7 kg)  10/11/16 169 lb 6.4 oz (76.8 kg)   Constitutional: overweight, in NAD Eyes: PERRLA, EOMI, no exophthalmos ENT: moist mucous membranes, no thyromegaly, no cervical lymphadenopathy Cardiovascular: RRR, No MRG Respiratory: CTA B Gastrointestinal: abdomen soft, NT, ND, BS+ Musculoskeletal: no deformities, strength intact in all 4 Skin: moist, warm, no rashes Neurological: no tremor with outstretched hands, DTR normal in all 4  ASSESSMENT: 1. DM2   2. Hypothyroidism   PLAN: 1. DM2 Patient with long-standing, uncontrolled DM. She previously refused insulin and also wanted to stop metformin due to diarrhea. At last visit, we changed to metformin ER and we continued at the lower dose. She also finally agreed to start insulin and we started at the low dose of Lantus, 12 units at bedtime. - At this visit, her sugars are approximately the same as before, despite increasing the Lantus to 30 units daily. She is still using metformin ER at 500 mg daily and I would not suggest to increase it due to her liver status. We discussed that adding a GLP-1 receptor agonist will help both her diabetes and her liver steatosis so I suggested to start Trulicity. She agrees with the plan. I explained how this works and expected benefits/side effects. - HbA1c 2 mo ago was 9.5 % >> we started insulin at that point - I suggested to:  Patient Instructions  Please continue - Lantus 30 units at bedtime - Metformin ER 500 mg 2x a day  Please start Trulicity 7.51 mg weekly. Let me know when you are close to running out to call in the higher dose to your pharmacy (1.5 mg).  Please continue  Levothyroxine 150 mcg daily.  Take the thyroid hormone every day, with water, at least 30 minutes before breakfast, separated by at least 4 hours from: - acid reflux medications - calcium - iron -  multivitamins  Please stop at the lab.  Please return in 1.5 months with your sugar log.   - continue checking sugars at different times of the day - check 2x a day, rotating checks - advised for yearly eye exams >> she needs one - Return to clinic in 1.5 mo with sugar log    2. Hypothyroidism - uncontrolled   - latest thyroid labs reviewed with pt >> high, but she was taking LT4 along with Prilosec >> we moved this later but also decreased LT4 dose from 200 to 150 mcg at that time - she continues on LT4 150 mcg daily - pt feels good on this dose. - we discussed about taking the thyroid hormone every day, with water, >30 minutes before breakfast, separated by >4 hours from acid reflux medications, calcium, iron, multivitamins. Pt. is now taking it correctly. - will check thyroid tests today: TSH and fT4 - If labs are abnormal, we'll need to repeat TFTs in 1.5 months  Component     Latest Ref Rng & Units 12/13/2016  Cholesterol     <200 mg/dL 180  HDL Cholesterol     >50 mg/dL 16 (L)  Triglycerides     <150 mg/dL 190 (H)  LDL Cholesterol (Calc)     mg/dL (calc) 131 (H)  Total CHOL/HDL Ratio     <5.0 (calc) 11.3 (H)  Non-HDL Cholesterol (Calc)     <130 mg/dL (calc) 164 (H)   High LDL, TG and low HDL. She will benefit from a statin (or at least Zetia). I discussed with Dr. Hilarie Fredrickson >> we can use a statin, but check LFT's q 2 weeks for 4-6 weeks. Will d/w pt.  Philemon Kingdom, MD PhD [facility] Endocrinology

## 2016-12-13 NOTE — Patient Instructions (Addendum)
Please continue - Lantus 30 units at bedtime - Metformin ER 500 mg 2x a day  Please start Trulicity 1.65 mg weekly. Let me know when you are close to running out to call in the higher dose to your pharmacy (1.5 mg).  Please continue Levothyroxine 150 mcg daily.  Take the thyroid hormone every day, with water, at least 30 minutes before breakfast, separated by at least 4 hours from: - acid reflux medications - calcium - iron - multivitamins  Please stop at the lab.  Please return in 1.5 months with your sugar log.

## 2016-12-18 ENCOUNTER — Encounter: Payer: Self-pay | Admitting: Internal Medicine

## 2016-12-19 ENCOUNTER — Other Ambulatory Visit: Payer: Self-pay | Admitting: Internal Medicine

## 2016-12-19 DIAGNOSIS — K7581 Nonalcoholic steatohepatitis (NASH): Secondary | ICD-10-CM

## 2016-12-19 MED ORDER — LIVALO 4 MG PO TABS: ORAL_TABLET | ORAL | 11 refills | Status: DC

## 2016-12-24 ENCOUNTER — Other Ambulatory Visit: Payer: Self-pay

## 2016-12-24 MED ORDER — LIVALO 4 MG PO TABS: ORAL_TABLET | ORAL | 11 refills | Status: DC

## 2016-12-27 ENCOUNTER — Other Ambulatory Visit: Payer: Self-pay

## 2016-12-27 MED ORDER — PRAVASTATIN SODIUM 20 MG PO TABS: 20.0000 mg | ORAL_TABLET | Freq: Every day | ORAL | 2 refills | Status: DC

## 2017-01-13 ENCOUNTER — Other Ambulatory Visit (INDEPENDENT_AMBULATORY_CARE_PROVIDER_SITE_OTHER): Payer: BLUE CROSS/BLUE SHIELD

## 2017-01-13 DIAGNOSIS — K7581 Nonalcoholic steatohepatitis (NASH): Secondary | ICD-10-CM

## 2017-01-14 LAB — HEPATIC FUNCTION PANEL
ALK PHOS: 63 U/L (ref 39–117)
ALT: 73 U/L — ABNORMAL HIGH (ref 0–35)
AST: 96 U/L — ABNORMAL HIGH (ref 0–37)
Albumin: 4.5 g/dL (ref 3.5–5.2)
BILIRUBIN DIRECT: 0.1 mg/dL (ref 0.0–0.3)
BILIRUBIN TOTAL: 0.6 mg/dL (ref 0.2–1.2)
Total Protein: 8.8 g/dL — ABNORMAL HIGH (ref 6.0–8.3)

## 2017-01-20 ENCOUNTER — Ambulatory Visit (INDEPENDENT_AMBULATORY_CARE_PROVIDER_SITE_OTHER): Payer: BLUE CROSS/BLUE SHIELD | Admitting: Orthopedic Surgery

## 2017-01-29 NOTE — Telephone Encounter (Signed)
Dr.Pyrtle notified.

## 2017-02-07 ENCOUNTER — Telehealth: Payer: Self-pay

## 2017-02-07 ENCOUNTER — Telehealth: Payer: Self-pay | Admitting: Internal Medicine

## 2017-02-07 MED ORDER — DULAGLUTIDE 1.5 MG/0.5ML ~~LOC~~ SOAJ: SUBCUTANEOUS | 3 refills | Status: DC

## 2017-02-07 NOTE — Telephone Encounter (Signed)
Should we increase to the 1.6m?

## 2017-02-07 NOTE — Telephone Encounter (Signed)
Received a fax stating that Lantus was denied for the patient. Would you like [place] to do a PA for this?  Key: [id number]

## 2017-02-07 NOTE — Telephone Encounter (Signed)
Pt is calling to check with her insurance. Will call us back

## 2017-02-07 NOTE — Telephone Encounter (Signed)
Yes, let's increase to 1.5 mg weekly. #4 pens with 3 refills.

## 2017-02-07 NOTE — Telephone Encounter (Signed)
No, she needs to find out if her insurance covers 1 of the following: - Basaglar - Tyler Aas - Toujeo - Levemir We can work with whichever of the above is covered

## 2017-02-07 NOTE — Telephone Encounter (Signed)
LMTCB

## 2017-02-10 ENCOUNTER — Other Ambulatory Visit: Payer: Self-pay | Admitting: Internal Medicine

## 2017-02-14 MED ORDER — INSULIN GLARGINE 100 UNIT/ML SOLOSTAR PEN: 30.0000 [IU] | PEN_INJECTOR | Freq: Every day | SUBCUTANEOUS | 0 refills | Status: DC

## 2017-02-14 NOTE — Telephone Encounter (Signed)
Insurance company now states they will pay for Lantus. Sent to pharmacy

## 2017-02-14 NOTE — Telephone Encounter (Signed)
Need a new prescription for Lantus for 30 units  Send to Pharmacy:  [facility], [place] - [phone] [place] [address]send to

## 2017-02-18 ENCOUNTER — Encounter: Payer: Self-pay | Admitting: Internal Medicine

## 2017-02-18 ENCOUNTER — Ambulatory Visit (INDEPENDENT_AMBULATORY_CARE_PROVIDER_SITE_OTHER): Payer: BLUE CROSS/BLUE SHIELD | Admitting: Internal Medicine

## 2017-02-18 VITALS — BP 130/82 | HR 96 | Ht 62.0 in | Wt 167.3 lb

## 2017-02-18 DIAGNOSIS — E038 Other specified hypothyroidism: Secondary | ICD-10-CM

## 2017-02-18 DIAGNOSIS — E1165 Type 2 diabetes mellitus with hyperglycemia: Secondary | ICD-10-CM | POA: Diagnosis not present

## 2017-02-18 DIAGNOSIS — E785 Hyperlipidemia, unspecified: Secondary | ICD-10-CM

## 2017-02-18 LAB — LIPID PANEL
CHOLESTEROL: 124 mg/dL (ref 0–200)
HDL: 17.5 mg/dL — AB (ref >39.00)
LDL Cholesterol: 79 mg/dL (ref 0–99)
NonHDL: 106.51
Total CHOL/HDL Ratio: 7
Triglycerides: 137 mg/dL (ref 0.0–149.0)
VLDL: 27.4 mg/dL (ref 0.0–40.0)

## 2017-02-18 LAB — MICROALBUMIN / CREATININE URINE RATIO
Creatinine,U: 228.3 mg/dL
MICROALB UR: 36.9 mg/dL — AB (ref 0.0–1.9)
Microalb Creat Ratio: 16.1 mg/g (ref 0.0–30.0)

## 2017-02-18 LAB — HEPATIC FUNCTION PANEL
ALK PHOS: 62 U/L (ref 39–117)
ALT: 59 U/L — AB (ref 0–35)
AST: 82 U/L — AB (ref 0–37)
Albumin: 4.2 g/dL (ref 3.5–5.2)
BILIRUBIN DIRECT: 0.3 mg/dL (ref 0.0–0.3)
BILIRUBIN TOTAL: 0.7 mg/dL (ref 0.2–1.2)
TOTAL PROTEIN: 8.1 g/dL (ref 6.0–8.3)

## 2017-02-18 LAB — T4, FREE: FREE T4: 0.96 ng/dL (ref 0.60–1.60)

## 2017-02-18 LAB — POCT GLYCOSYLATED HEMOGLOBIN (HGB A1C): Hemoglobin A1C: 9

## 2017-02-18 LAB — TSH: TSH: 17.64 u[IU]/mL — ABNORMAL HIGH (ref 0.35–4.50)

## 2017-02-18 NOTE — Patient Instructions (Addendum)
Please increase: - Lantus 35 units at bedtime  Please continue: - Metformin ER 500 mg 2x a day - Trulicity 1.5 mg weekly  Please continue Levothyroxine 150 mcg daily.  Take the thyroid hormone every day, with water, at least 30 minutes before breakfast, separated by at least 4 hours from: - acid reflux medications - calcium - iron - multivitamins  Please stop at the lab.  Please return in 3 months with your sugar log.

## 2017-02-18 NOTE — Progress Notes (Signed)
Patient ID: Emma Glenn, female   DOB: 1965/12/23, 52 y.o.   MRN: [id number]   HPI: Emma Glenn is a 52 y.o.-year-old female, initially referred by her GI Dr, Dr. Hilarie Fredrickson, for evaluation and tx of high urinary cortisol. She also had very uncontrolled DM2, insulin dependent (but refused insulin in the past) and hypothyroidism. Last visit  2 mo ago. On Cobra now.  Because of the change in insurance, she was off her insulin for 1 week.  She is now back on Lantus.  DM2: Reviewed HbA1c levels: Lab Results  Component Value Date   HGBA1C 9.5 10/11/2016   HGBA1C 12.7 07/02/2016   HGBA1C 5.6 04/28/2015  Labs from [facility], drawn on 07/29/2016: HbA1c still very high, at 12.6%  Pt is on a regimen of: - Lantus 30 units at bedtime - Metformin ER 500 mg 2x a day - she tolerates this only with dicyclomine due to diarrhea - Trulicity 1.5 mg weekly  Pt checks sugars 1-2x a day: - am: 203-232 >> 152-239, 254 >> 160-170 - 2h after b'fast: n/c - before lunch: n/c - 2h after lunch: n/c - before dinner: n/c >> 150 - 2h after dinner: n/c - bedtime: n/c  - nighttime: n/c Lowest sugar was 203 >> 152 >> 155; ? If she has hypoglycemia awareness. Highest sugar was 257 (sweet tea) >> 254 >> 178  [name] meals are: - Breakfast: Cereals or 2 eggs and toast - Lunch: Skips - Dinner: Meat and veggies - Snacks: Fruit   Meter: Bayer contour   - No CKD, last BUN/creatinine:  Lab Results  Component Value Date   BUN 12 12/10/2016   BUN 14 07/31/2016   CREATININE 0.64 12/10/2016   CREATININE 0.93 07/31/2016  07/29/2016: UACR was high, 148.9 -+ HL; last set of lipids: Lab Results  Component Value Date   CHOL 180 12/13/2016   HDL 16 (L) 12/13/2016   TRIG 190 (H) 12/13/2016   CHOLHDL 11.3 (H) 12/13/2016  We started pravastatin in 12/2016 after we consulted Dr. Hilarie Fredrickson >> he advised that we can use a statin, but check LFT's q 2 weeks for 4-6. She came for labs once after we started -LFTs were not worse:    Component     Latest Ref Rng & Units 09/23/2016 12/10/2016 01/13/2017  Total Bilirubin     0.2 - 1.2 mg/dL 0.6 0.7 0.6  Alkaline Phosphatase     39 - 117 U/L 74 71 63  AST     0 - 37 U/L 80 (H) 104 (H) 96 (H)  ALT     0 - 35 U/L 64 (H) 70 (H) 73 (H)  Total Protein     6.0 - 8.3 g/dL 7.9 8.5 (H) 8.8 (H)  Albumin     3.5 - 5.2 g/dL 4.5 4.3 4.5  Calcium     8.4 - 10.5 mg/dL  10.2   GFR     >60.00 mL/min  103.22   Bilirubin, Direct     0.0 - 0.3 mg/dL 0.3  0.1   - last eye exam: 01/2015: No DR reportedly - She denies numbness and tingling in her feet.  She also has hypothyroidism:  Pt is on levothyroxine 150 mcg daily, taken: - misses this only rarely - in am - fasting - at least 60 min from b'fast - no Ca, Fe, MVI - We moved Prilosec at least 4 hours after levothyroxine at last visit - not on Biotin  Labs from [place]:  07/29/2016:  TSH was high: 6.18.  Lab Results  Component Value Date   TSH 4.45 06/04/2016   Reviewed previous history: She started to have severe generalized mm cramps in 03/2016.   Around that time >> RUQ pain >> HIDA scan >> GB w/o clear stones, but with sludge. She had an abd. CT scan: increased size of her liver + liver steatosis.  She was also found to have a 24-hour urine free cortisol, which returned slightly elevated: Component     Latest Ref Rng & Units 06/04/2016 06/14/2016  Cortisol (Ur), Free     4.0 - 50.0 mcg/24 h  57.0 (H)  Results received     0.63 - 2.50 g/24 h  1.27  Cortisol, Plasma     Ug/dL (collected at 5 pm) 6.8    She is not on OCPs.  A CT abdomen (05/2016) showed normal adrenals.  A brain MRI (04/2015) showed normal pituitary gland.  At last visit, we checked a dexamethasone suppression test: normal >> no signs of Cushing syndrome:  Component     Latest Ref Rng & Units 07/02/2016  Cortisol - AM     mcg/dL 1.8 (L)  Dexamethasone, Serum     ng/dL 385   She had a liver Bx  >> has level 3 scarring. Also she was found to  have the HFE H63D - homozygous >> hemochromatosis.  ROS: Constitutional: no weight gain/no weight loss, + fatigue, no subjective hyperthermia, no subjective hypothermia Eyes: no blurry vision, no xerophthalmia ENT: no sore throat, no nodules palpated in throat, no dysphagia, no odynophagia, no hoarseness Cardiovascular: no CP/no SOB/no palpitations/no leg swelling Respiratory: no cough/no SOB/no wheezing Gastrointestinal: no N/no V/no D/no C/no acid reflux Musculoskeletal: no muscle aches/no joint aches Skin: no rashes, no hair loss Neurological: no tremors/no numbness/no tingling/no dizziness  I reviewed pt's medications, allergies, PMH, social hx, family hx, and changes were documented in the history of present illness. Otherwise, unchanged from my initial visit note.  Past Medical History:  Diagnosis Date  . CIN I (cervical intraepithelial neoplasia I)   . COPD (chronic obstructive pulmonary disease) ([place])   . GERD (gastroesophageal reflux disease)   . Hepatic steatosis   . Hypertension   . Hypothyroidism   . Labial cyst    Inclusion cyst  . Positive ANA (antinuclear antibody)   . Seizures ([place])    last seizure 3/17? over dose wellbutrin  . Thyroid disease    Graves dis-Radioactive Iodine   Past Surgical History:  Procedure Laterality Date  . CERVICAL BIOPSY  W/ LOOP ELECTRODE EXCISION     Excision labial inclusion cyst  . CESAREAN SECTION    . COLPOSCOPY    . HAND SURGERY    . SHOULDER ARTHROSCOPY WITH OPEN ROTATOR CUFF REPAIR AND DISTAL CLAVICLE ACROMINECTOMY Left 06/13/2015   Procedure: LEFT SHOULDER ARTHROSCOPY, DEBRIDEMENT, OPEN ROTATOR CUFF REPAIR, CORACOID FRACTURE FIXATION, BICEPS TENODESIS;  Surgeon: Meredith Pel, MD;  Location: [facility];  Service: Orthopedics;  Laterality: Left;  . SHOULDER CLOSED REDUCTION Left 09/26/2015   Procedure: CLOSED MANIPULATION SHOULDER UNDER ANESTHESIA;  Surgeon: Meredith Pel, MD;  Location: [facility];  Service: Orthopedics;   Laterality: Left;  . TUBAL LIGATION     Social History   Social History  . Marital status: Married    Spouse name: Tommie Raymond  . Number of children: 1  . Years of education: 80   Occupational History  . Material handler    Social History Main Topics  . Smoking  status: Former Smoker    Packs/day: 1.00    Types: Cigarettes    Quit date: 02/14/2015  . Smokeless tobacco: Never Used  . Alcohol use 0.0 oz/week     Comment: rare  . Drug use: No     Comment: Hx of marijuana use  . Sexual activity: Yes    Birth control/ protection: Surgical   Social History Narrative   Lives with husband and sister   Caffeine use: Tea/coffee daily      Current Outpatient Medications on File Prior to Visit  Medication Sig Dispense Refill  . dicyclomine (BENTYL) 20 MG tablet Take 1 tablet (20 mg total) by mouth 3 (three) times daily as needed for spasms (increased abdominal pain). 90 tablet 2  . Dulaglutide (TRULICITY) 4.43 XV/4.0GQ SOPN Inject 0.75 mg weekly under skin 4 pen 1  . Dulaglutide (TRULICITY) 1.5 QP/6.1PJ SOPN Inject 1.19m into skin weekly 4 pen 3  . glucose blood (BAYER CONTOUR TEST) test strip Use 2x a day 200 each 3  . Glycopyrrolate-Formoterol (BEVESPI AEROSPHERE) 9-4.8 MCG/ACT AERO Inhale 2 puffs into the lungs 2 (two) times daily. 10.7 g 6  . Insulin Glargine (LANTUS SOLOSTAR) 100 UNIT/ML Solostar Pen Inject 30 Units into the skin at bedtime. Dx: E11.65 27 pen 0  . Insulin Pen Needle (CAREFINE PEN NEEDLES) 32G X 4 MM MISC Use 1x a day 100 each 3  . levothyroxine (SYNTHROID, LEVOTHROID) 150 MCG tablet TAKE ONE TABLET BY MOUTH EVERY MORNING 60 tablet 0  . losartan (COZAAR) 50 MG tablet Take 50 mg by mouth daily.    . metFORMIN (GLUCOPHAGE-XR) 500 MG 24 hr tablet Take 2 tablets daily 180 tablet 3  . omeprazole (PRILOSEC) 40 MG capsule Take 40 mg by mouth daily.    . pravastatin (PRAVACHOL) 20 MG tablet Take 1 tablet (20 mg total) daily by mouth. 30 tablet 2   No current  facility-administered medications on file prior to visit.    Allergies  Allergen Reactions  . Doxycycline Other (See Comments)    Flu like symptoms (high fever, chills)  . Nitrofurantoin Monohyd Macro Other (See Comments)    Flu like symptoms (high fever, chills)  . Septra [Sulfamethoxazole-Trimethoprim] Other (See Comments)    Flu like symptoms (high fever, chills)  . Lamictal [Lamotrigine] Rash   Family History  Problem Relation Age of Onset  . Hypertension Mother   . Heart disease Mother   . Diabetes Father   . Heart disease Father   . Heart disease Brother   . Breast cancer Maternal Grandmother   . Seizures Neg Hx   . Stomach cancer Neg Hx   . Colon cancer Neg Hx    PE: BP 130/82   Pulse 96   Ht 5' 2"  (1.575 m)   Wt 167 lb 4.8 oz (75.9 kg)   SpO2 96%   BMI 30.60 kg/m  Wt Readings from Last 3 Encounters:  02/18/17 167 lb 4.8 oz (75.9 kg)  12/13/16 168 lb 9.3 oz (76.5 kg)  12/10/16 169 lb 2 oz (76.7 kg)   Constitutional: overweight, in NAD Eyes: PERRLA, EOMI, no exophthalmos ENT: moist mucous membranes, no thyromegaly, no cervical lymphadenopathy Cardiovascular: tachycardia, RR, No MRG Respiratory: CTA B Gastrointestinal: abdomen soft, NT, ND, BS+ Musculoskeletal: no deformities, strength intact in all 4 Skin: moist, warm, no rashes Neurological: no tremor with outstretched hands, DTR normal in all 4  ASSESSMENT: 1. DM2   2. Hypothyroidism   3. HL  PLAN: 1. DM2 Patient with  long-standing, uncontrolled diabetes, on metformin, GLP-1 receptor agonist and Lantus.  Unfortunately, she was off her insulin for 1 week due to insurance changes.  She is now back on it and sugars are improved, but still slightly above target.  She just started the higher dose of Trulicity last week.  We will continue with 1.5 mg weekly.  However, since sugars are still high in a.m., will increase her Lantus slightly. - I suggested to:  Patient Instructions  Please increase: - Lantus  35 units at bedtime  Please continue: - Metformin ER 500 mg 2x a day - Trulicity 1.5 mg weekly  Please continue Levothyroxine 150 mcg daily.  Take the thyroid hormone every day, with water, at least 30 minutes before breakfast, separated by at least 4 hours from: - acid reflux medications - calcium - iron - multivitamins  Please stop at the lab.  Please return in 3 months with your sugar log.   - today, HbA1c is 9.0% (slightly better) - continue checking sugars at different times of the day - check 1-2x a day, rotating checks - advised for yearly eye exams >> she is due - We will check a microalbumin to creatinine ratio today since it was high before  - Return to clinic in 3 mo with sugar log     2. Hypothyroidism - uncontrolled   - latest thyroid labs reviewed with pt >> slightly high >> but she was taking LT4 along with Prilosec >> we moved this later but also decreased LT4 dose from 200 to 150 mcg at that time - she continues on LT4 150 mcg daily - pt feels good on this dose. - we discussed about taking the thyroid hormone every day, with water, >30 minutes before breakfast, separated by >4 hours from acid reflux medications, calcium, iron, multivitamins. Pt. is taking it correctly now. - will check thyroid tests today: TSH and fT4 - If labs are abnormal, she will need to return for repeat TFTs in 1.5 months  3. HL  - reviewed together her previous Lipid panel from last visit.  Cholesterol fractions were abnormal so I suggested to start pravastatin.  I checked with her GI doctor, Dr. Hilarie Fredrickson, and he agreed with the medication as long as we check LFTs every 2 weeks for every 4-6 weeks.  We checked one set of tests, however, she did not return since she had problems with insurance.  However, after we started the statin, LFTs did not change at the first check. - We will check another set of liver tests today.  - She is tolerating the medication well.   - We will check another  cholesterol level today.  Orders Placed This Encounter  Procedures  . TSH  . T4, free  . Lipid panel  . Microalbumin / creatinine urine ratio  . Hepatic Function Panel   Component     Latest Ref Rng & Units 02/18/2017  Total Bilirubin     0.2 - 1.2 mg/dL 0.7  Bilirubin, Direct     0.0 - 0.3 mg/dL 0.3  Alkaline Phosphatase     39 - 117 U/L 62  AST     0 - 37 U/L 82 (H)  ALT     0 - 35 U/L 59 (H)  Total Protein     6.0 - 8.3 g/dL 8.1  Albumin     3.5 - 5.2 g/dL 4.2  Cholesterol     0 - 200 mg/dL 124  Triglycerides  0.0 - 149.0 mg/dL 137.0  HDL Cholesterol     >39.00 mg/dL 17.50 (L)  VLDL     0.0 - 40.0 mg/dL 27.4  LDL (calc)     0 - 99 mg/dL 79  Total CHOL/HDL Ratio      7  NonHDL      106.51  Microalb, Ur     0.0 - 1.9 mg/dL 36.9 (H)  Creatinine,U     mg/dL 228.3  MICROALB/CREAT RATIO     0.0 - 30.0 mg/g 16.1  TSH     0.35 - 4.50 uIU/mL 17.64 (H)  Hemoglobin A1C      9.0  T4,Free(Direct)     0.60 - 1.60 ng/dL 0.96   AST and ALT slightly improved.  We can continue with the statin. Microalbumin to creatinine ratio also improved. LDL at goal. TSH is much higher, and I suspect that she missed more doses that she remembers.  We will continue the current dose of levothyroxine and will recheck her TFTs at next visit.  In the meantime, I will strongly advised her to take levothyroxine every day.  Philemon Kingdom, MD PhD [facility] Endocrinology

## 2017-02-25 ENCOUNTER — Encounter: Payer: Self-pay | Admitting: Internal Medicine

## 2017-02-25 ENCOUNTER — Ambulatory Visit (INDEPENDENT_AMBULATORY_CARE_PROVIDER_SITE_OTHER): Payer: BLUE CROSS/BLUE SHIELD | Admitting: Internal Medicine

## 2017-02-25 VITALS — BP 112/74 | HR 64 | Ht 62.0 in | Wt 166.4 lb

## 2017-02-25 DIAGNOSIS — K7581 Nonalcoholic steatohepatitis (NASH): Secondary | ICD-10-CM | POA: Diagnosis not present

## 2017-02-25 DIAGNOSIS — E1165 Type 2 diabetes mellitus with hyperglycemia: Secondary | ICD-10-CM

## 2017-02-25 DIAGNOSIS — K760 Fatty (change of) liver, not elsewhere classified: Secondary | ICD-10-CM

## 2017-02-25 DIAGNOSIS — R110 Nausea: Secondary | ICD-10-CM | POA: Diagnosis not present

## 2017-02-25 DIAGNOSIS — R1011 Right upper quadrant pain: Secondary | ICD-10-CM | POA: Diagnosis not present

## 2017-02-25 MED ORDER — ONDANSETRON 4 MG PO TBDP: 4.0000 mg | ORAL_TABLET | Freq: Three times a day (TID) | ORAL | 0 refills | Status: DC | PRN

## 2017-02-25 MED ORDER — OMEPRAZOLE 40 MG PO CPDR: 40.0000 mg | DELAYED_RELEASE_CAPSULE | Freq: Every day | ORAL | 3 refills | Status: DC

## 2017-02-25 NOTE — Progress Notes (Signed)
Subjective:    Patient ID: Emma Glenn, female    DOB: 02-10-1966, 52 y.o.   MRN: [id number]  HPI Emma Glenn is a 52 year old female with a history of NASH with advanced, F3, fibrosis, GERD, Graves' disease, significant diabetes and hypertension, hyperlipidemia who is here for follow-up.  She was last seen on 12/10/2016.  Recall her NASH is biopsy-proven.  She also had a very elevated ANA, positive anti-smooth muscle antibody and normal IgG.  She has been seen by [facility], endocrinology, rheumatology, dermatology, and primary care.  She reports that she has been doing about the same.  She is working closely with endocrinology for management of her diabetes, thyroid disease and hyperlipidemia.  She started pravastatin about 1 month ago.  She is also had titration of her diabetes medications.  Her last A1c was 9.0, down from over 12.  Sugars still fluctuate at home.  From a GI perspective she still has her chronic right upper quadrant discomfort which is stable.  She has had 2 episodes of pain between her shoulder blades radiating around to her right upper quadrant.  This last occurred on 01/25/2017 and lasted about 30 minutes.  This occurred while she was at a Christmas party.  She is noticed more early morning nausea without vomiting.  Her loose stools have improved with Bentyl.  She is using this but not often.  When she takes that she uses 20 mg.  He has had no blood in her stool or melena.  Review of Systems As per HPI, otherwise negative  Current Medications, Allergies, Past Medical History, Past Surgical History, Family History and Social History were reviewed in [employer] record.     Objective:   Physical Exam BP 112/74   Pulse 64   Ht 5' 2"  (1.575 m)   Wt 166 lb 6.4 oz (75.5 kg)   BMI 30.43 kg/m  Constitutional: Well-developed and well-nourished. No distress. HEENT: Normocephalic and atraumatic. Oropharynx is clear and moist. Conjunctivae are  normal.  No scleral icterus. Neck: Neck supple. Trachea midline. Cardiovascular: Normal rate, regular rhythm and intact distal pulses.  Pulmonary/chest: Effort normal and breath sounds normal. No wheezing, rales or rhonchi. Abdominal: Soft, nontender, nondistended. Bowel sounds active throughout. Obese. Extremities: no clubbing, cyanosis, or edema Neurological: Alert and oriented to person place and time. Skin: Skin is warm and dry. Psychiatric: Normal mood and affect. Behavior is normal.  CBC    Component Value Date/Time   WBC 9.6 12/10/2016 1444   RBC 4.13 12/10/2016 1444   HGB 12.5 12/10/2016 1444   HCT 38.2 12/10/2016 1444   PLT 292.0 12/10/2016 1444   MCV 92.4 12/10/2016 1444   MCH 30.4 07/31/2016 1119   MCHC 32.8 12/10/2016 1444   RDW 14.3 12/10/2016 1444   LYMPHSABS 2.6 12/10/2016 1444   MONOABS 0.6 12/10/2016 1444   EOSABS 0.3 12/10/2016 1444   BASOSABS 0.1 12/10/2016 1444   CMP     Component Value Date/Time   NA 137 12/10/2016 1444   NA 137 06/25/2016 1347   K 4.1 12/10/2016 1444   CL 99 12/10/2016 1444   CO2 27 12/10/2016 1444   GLUCOSE 276 (H) 12/10/2016 1444   BUN 12 12/10/2016 1444   BUN 14 06/25/2016 1347   CREATININE 0.64 12/10/2016 1444   CALCIUM 10.2 12/10/2016 1444   PROT 8.1 02/18/2017 1627   ALBUMIN 4.2 02/18/2017 1627   AST 82 (H) 02/18/2017 1627   ALT 59 (H) 02/18/2017 1627  ALKPHOS 62 02/18/2017 1627   BILITOT 0.7 02/18/2017 1627   GFRNONAA >60 07/31/2016 1119   GFRAA >60 07/31/2016 1119   Lab Results  Component Value Date   INR 1.3 (H) 12/10/2016   INR 1.3 (H) 09/23/2016   INR 1.04 07/31/2016   Iron/TIBC/Ferritin/ %Sat    Component Value Date/Time   IRON 62 09/23/2016 1457   FERRITIN 47.9 12/10/2016 1444   IRONPCTSAT 12.8 (L) 09/23/2016 1457        Assessment & Plan:  52 year old female with a history of NASH with advanced, F3, fibrosis, GERD, Graves' disease, significant diabetes and hypertension, hyperlipidemia who is here  for follow-up.   1.  NASH --biopsy-proven.  I spoke to [facility] today by phone to follow-up the second opinion on her liver biopsy at [facility].  This was requested but it does not appear ever performed.  She will expedite this today.  I would like to prove that there is no evidence of overlapping autoimmune hepatitis, particularly in light of her positive ANA and smooth muscle antibody. --We discussed the importance of fatty liver management which for her primarily is getting her blood sugars under better control, but also controlling her weight, cholesterol and blood pressure.  She is working with primary care and endocrinology to this regard --Follow-up secondary path opinion from [facility] --No evidence of varices in June 2018, repeat in 2-3 years. --3-58-monthfollow-up  2.  Right upper quadrant pain --I feel that some of her pain is due to capsular distention by fatty liver.  That said the episodes that she has had between her scapula radiating to the right upper quadrant may be gallbladder disease.  Her HIDA scan performed last year showed borderline gallbladder function raising the question of dyskinesia.  If these episodes continue I asked that she notify me and we would likely reorder HIDA with [facility] and consider surgery  3.  Loose stools --improved overall likely due to better tolerance of her diabetes medication.  Continue Bentyl 20 mg twice daily to 3 times daily as needed  4.  Nausea --possibly secondary to diabetic gastroparesis.  No vomiting.  Will give Zofran 4 mg every 8 hours as needed severe nausea.  She continues once daily PPI, continue Prilosec 40 mg daily  3-634-monthollow-up, sooner if needed 25 minutes spent with the patient today. Greater than 50% was spent in counseling and coordination of care with the patient

## 2017-02-25 NOTE — Patient Instructions (Addendum)
Please follow up with Dr Hilarie Fredrickson in 3-6 months.  Continue Bentyl as needed.  We have sent the following medications to your pharmacy for you to pick up at your convenience: Prilosec  zofran odt 4 mg every 8 hours as needed for nausea/vomiting  If you are age 54 or older, your body mass index should be between 23-30. Your Body mass index is 30.43 kg/m. If this is out of the aforementioned range listed, please consider follow up with your Primary Care Provider.  If you are age 39 or younger, your body mass index should be between 19-25. Your Body mass index is 30.43 kg/m. If this is out of the aformentioned range listed, please consider follow up with your Primary Care Provider.

## 2017-03-20 DIAGNOSIS — Z0271 Encounter for disability determination: Secondary | ICD-10-CM

## 2017-03-21 IMAGING — NM NM HEPATO W/GB/PHARM/[PERSON_NAME]
3 series · 13 of 13 positions shown · non-contrast
Comparison: Ultrasound 03/18/2016

CLINICAL DATA: History of right upper quadrant abdominal pain

EXAM:
NUCLEAR MEDICINE HEPATOBILIARY IMAGING WITH GALLBLADDER EF
TECHNIQUE: Sequential images of the abdomen were obtained [DATE] minutes
following intravenous administration of radiopharmaceutical. After
oral ingestion of Ensure, gallbladder ejection fraction was
determined. At 60 min, normal ejection fraction is greater than 33%.
RADIOPHARMACEUTICALS:  5.25 mCi 6c-CCm  Choletec IV

[Series 1: biliary · 4.14mm/px · 6 of 60 frames shown]
[frame 6/60]
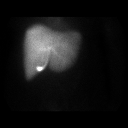
[frame 16/60]
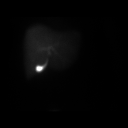
[frame 26/60]
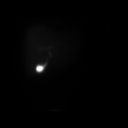
[frame 36/60]
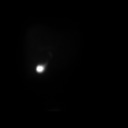
[frame 46/60]
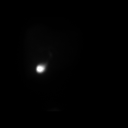
[frame 56/60]
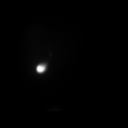

[Series 2: rt lat · 4.14mm/px · 1 of 1 slices shown]
[im 1/1]
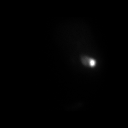

[Series 3: gbef · 4.14mm/px · 6 of 60 frames shown]
[frame 6/60]
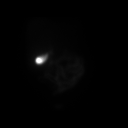
[frame 16/60]
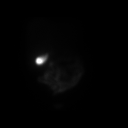
[frame 26/60]
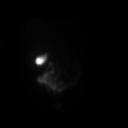
[frame 36/60]
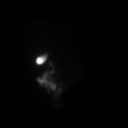
[frame 46/60]
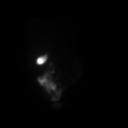
[frame 56/60]
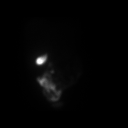

[13 of 13 positions shown; findings below may reference images not displayed]

FINDINGS: Prompt uptake and biliary excretion of activity by the liver is
seen. Gallbladder activity is visualized, consistent with patency of
cystic duct. Biliary activity passes into small bowel, consistent
with patent common bile duct.

Calculated gallbladder ejection fraction is 39%%. (Normal
gallbladder ejection fraction with Ensure is greater than 33%.)
IMPRESSION: 1. Normal visualization of the gallbladder an bowel activity
2. Low-normal gallbladder ejection fraction.

## 2017-04-12 ENCOUNTER — Other Ambulatory Visit: Payer: Self-pay | Admitting: Internal Medicine

## 2017-04-15 ENCOUNTER — Other Ambulatory Visit: Payer: Self-pay | Admitting: Internal Medicine

## 2017-04-29 ENCOUNTER — Other Ambulatory Visit: Payer: Self-pay | Admitting: Internal Medicine

## 2017-04-30 ENCOUNTER — Telehealth: Payer: Self-pay | Admitting: Internal Medicine

## 2017-04-30 ENCOUNTER — Other Ambulatory Visit: Payer: Self-pay

## 2017-04-30 MED ORDER — INSULIN GLARGINE 100 UNIT/ML SOLOSTAR PEN: PEN_INJECTOR | SUBCUTANEOUS | 3 refills | Status: DC

## 2017-04-30 NOTE — Telephone Encounter (Signed)
I increased Lantus to 35 units per Dr. Arman Filter last office note

## 2017-04-30 NOTE — Telephone Encounter (Signed)
LANTUS SOLOSTAR 100 UNIT/ML Solostar Pen  [facility] pharmacy is calling in regards to patients mediation. This was sent in as 30 units and they stated patient is telling them she takes 35 units.   They need clarification on this.  Please advise [phone]

## 2017-05-06 IMAGING — CT CT PELVIS W/ CM
2 of 3 series · 16 of 46 positions shown, 18 images · IV contrast (ISOVUE 300)
Comparison: Abdomen CT 05/29/2016

CLINICAL DATA: Mild lymphadenopathy upper abdomen on recent abdomen
CT.

EXAM:
CT PELVIS WITH CONTRAST
TECHNIQUE: Multidetector CT imaging of the pelvis was performed using the
standard protocol following the bolus administration of intravenous
contrast.
CONTRAST:  80mL T3WL2P-UKK IOPAMIDOL (T3WL2P-UKK) INJECTION 61%

[Series 2: pelvis st · axial · 0.75mm/px · z∈[-292,-92]mm · 13 of 48 slices shown, 15 images]
[im 4/48  soft-tissue]
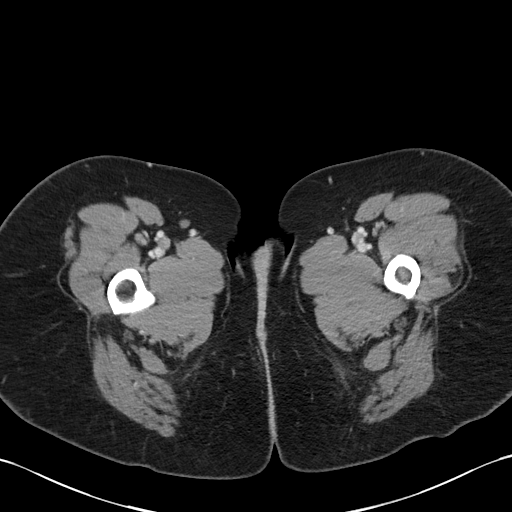
[im 4/48  bone]
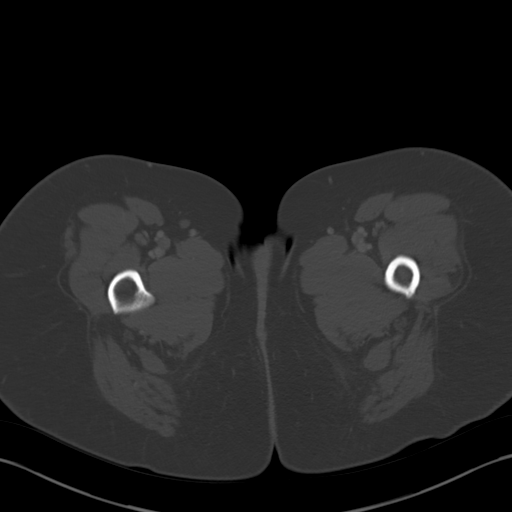
[im 7/48  soft-tissue]
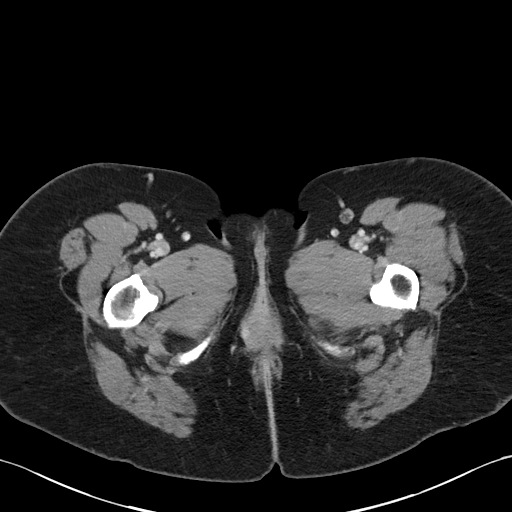
[im 10/48  soft-tissue]
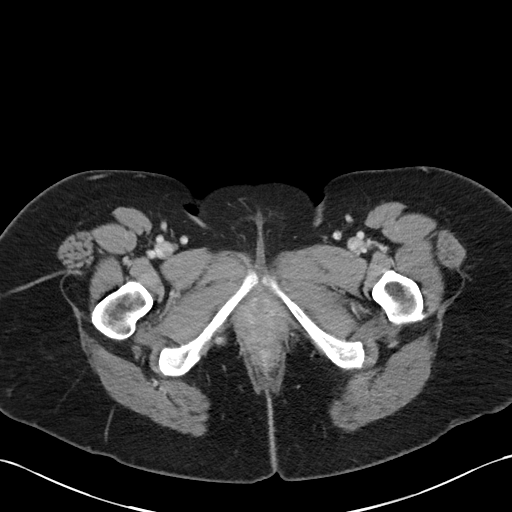
[im 14/48  soft-tissue]
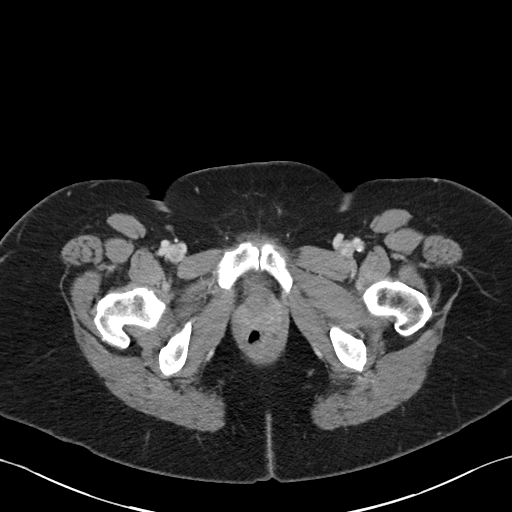
[im 17/48  soft-tissue]
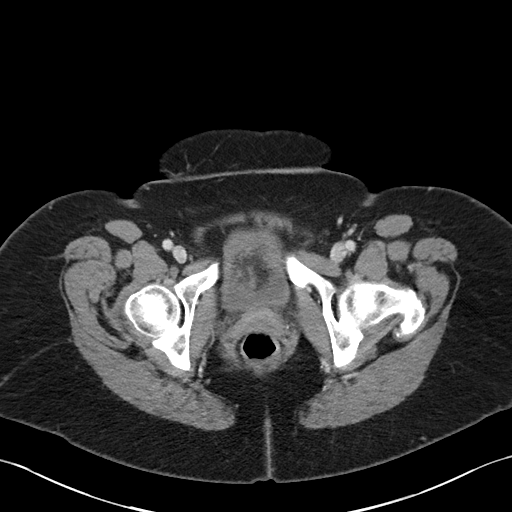
[im 20/48  soft-tissue]
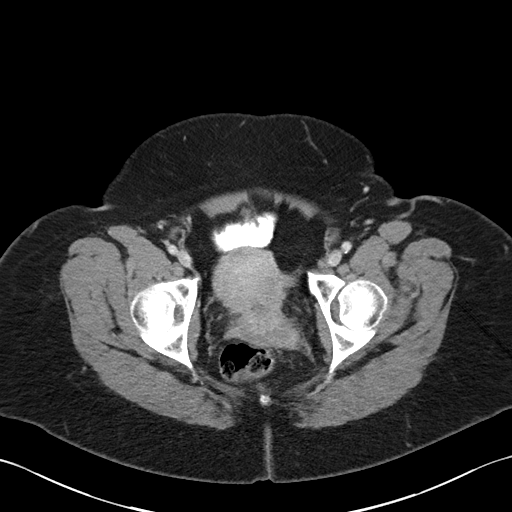
[im 25/48  soft-tissue]
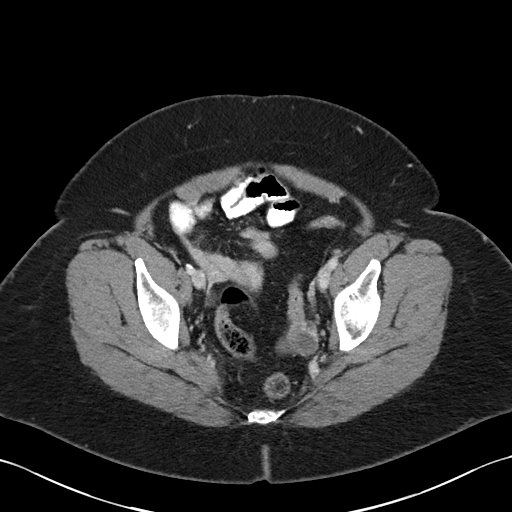
[im 28/48  soft-tissue]
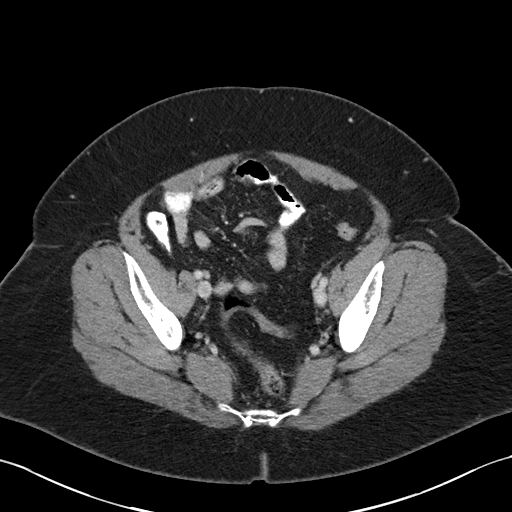
[im 31/48  soft-tissue]
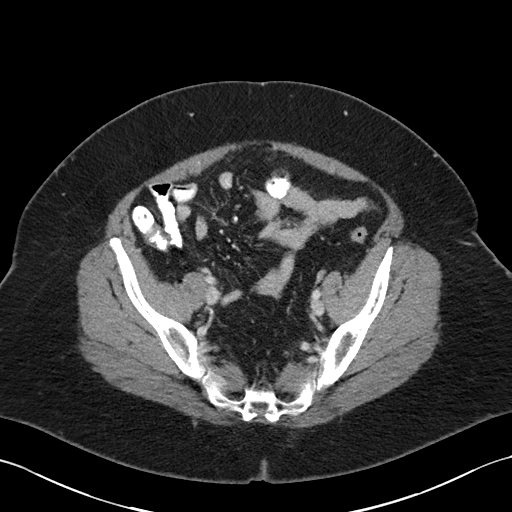
[im 31/48  bone]
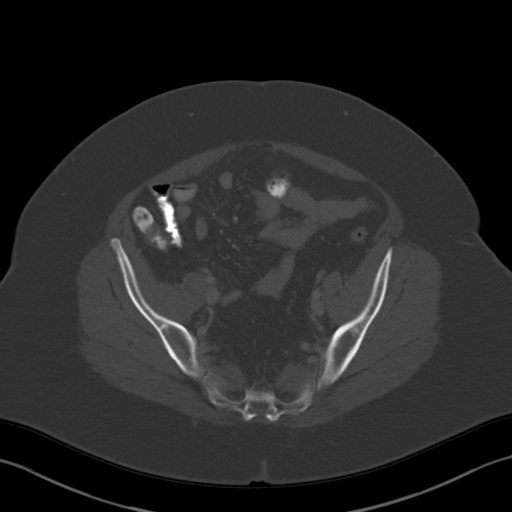
[im 34/48  soft-tissue]
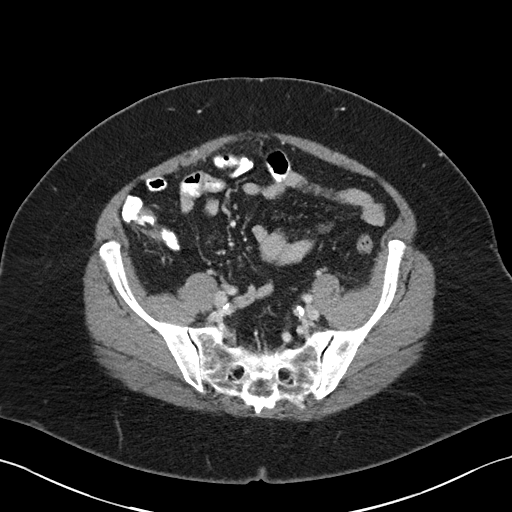
[im 38/48  soft-tissue]
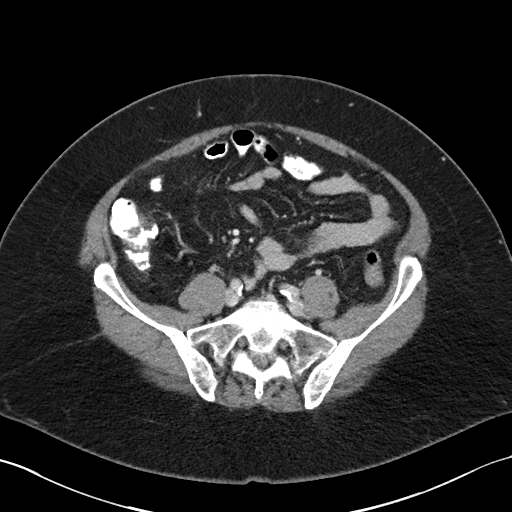
[im 41/48  soft-tissue]
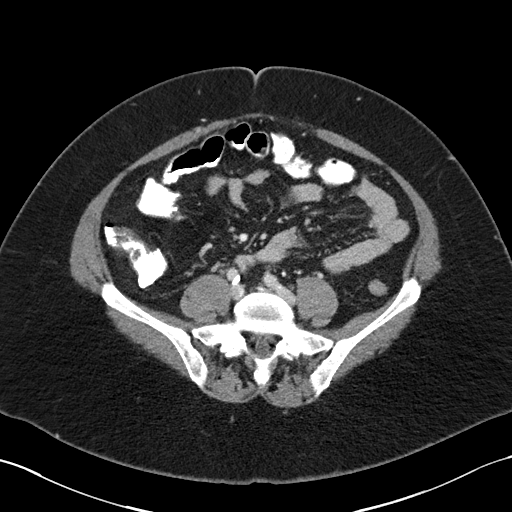
[im 44/48  soft-tissue]
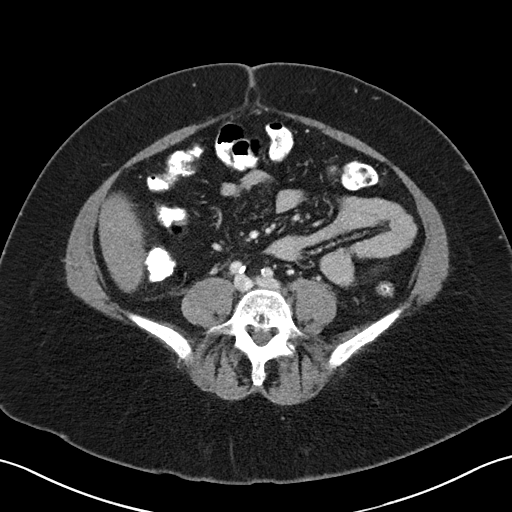

[Series 4: coronal st · coronal · 0.46mm/px · 3 of 78 slices shown]
[im 26/78  soft-tissue]
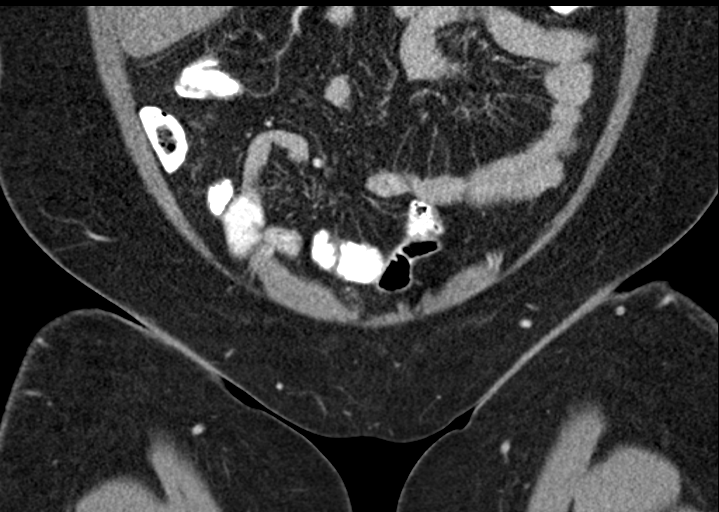
[im 35/78  soft-tissue]
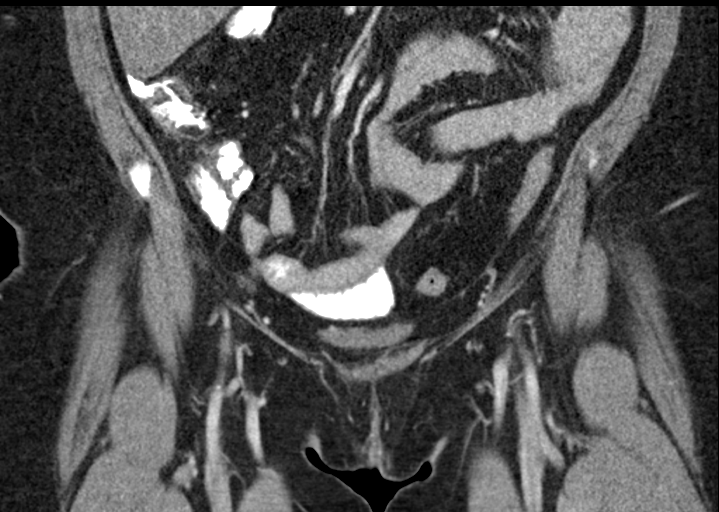
[im 43/78  soft-tissue]
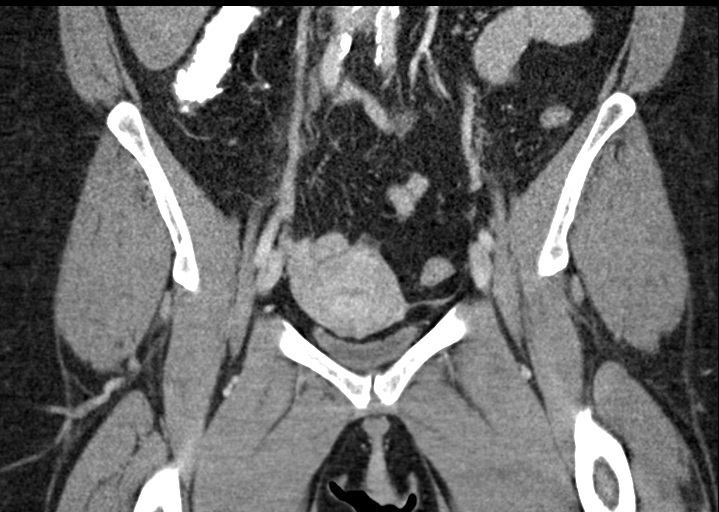

[16 of 46 positions shown; findings below may reference images not displayed]

FINDINGS: Urinary Tract:  Unremarkable.

Bowel: No bowel dilatation or wall thickening within the visualized
large or small bowel.

Vascular/Lymphatic: Atherosclerotic calcification noted common iliac
arteries bilaterally. No pelvic sidewall lymphadenopathy.

Reproductive: The uterus has normal CT imaging appearance. There is
no adnexal mass. Small benign appearing cystic areas identified in
each ovary measuring up to a maximum of 18 mm.

Other:  No intraperitoneal free fluid.

Musculoskeletal: Bone windows reveal no worrisome lytic or sclerotic
osseous lesions.
IMPRESSION: 1. No lymphadenopathy in the pelvis.
2. Tiny cystic areas in each ovary have benign appearance by CT
imaging. Based on lesion size and patient age, these can be
considered benign with no additional follow-up imaging indicated.
This recommendation follows ACR consensus guidelines: White Paper of
the ACR Incidental Findings Committee II on Adnexal Findings. [HOSPITAL] [DATE].

## 2017-05-20 ENCOUNTER — Ambulatory Visit: Payer: BLUE CROSS/BLUE SHIELD | Admitting: Internal Medicine

## 2017-05-21 ENCOUNTER — Encounter: Payer: Self-pay | Admitting: Internal Medicine

## 2017-05-21 ENCOUNTER — Ambulatory Visit (INDEPENDENT_AMBULATORY_CARE_PROVIDER_SITE_OTHER): Payer: BLUE CROSS/BLUE SHIELD | Admitting: Internal Medicine

## 2017-05-21 VITALS — BP 112/70 | HR 88 | Ht 62.0 in | Wt 160.6 lb

## 2017-05-21 DIAGNOSIS — E785 Hyperlipidemia, unspecified: Secondary | ICD-10-CM | POA: Diagnosis not present

## 2017-05-21 DIAGNOSIS — E038 Other specified hypothyroidism: Secondary | ICD-10-CM

## 2017-05-21 DIAGNOSIS — E1165 Type 2 diabetes mellitus with hyperglycemia: Secondary | ICD-10-CM

## 2017-05-21 LAB — TSH: TSH: 2.65 u[IU]/mL (ref 0.35–4.50)

## 2017-05-21 LAB — HEMOGLOBIN A1C: Hemoglobin A1C: 7.7

## 2017-05-21 LAB — T4, FREE: Free T4: 1.36 ng/dL (ref 0.60–1.60)

## 2017-05-21 NOTE — Patient Instructions (Addendum)
Please continue: - Lantus 35 units at bedtime - Metformin ER 500 mg 2x a day - Trulicity 1.5 mg weekly  Please continue Levothyroxine 150 mcg daily.  Take the thyroid hormone every day, with water, at least 30 minutes before breakfast, separated by at least 4 hours from: - acid reflux medications - calcium - iron - multivitamins  Please stop at the lab.  Please schedule a new eye appt.  Please schedule an appt with Antonieta Iba with nutrition.  Please return in 3 months with your sugar log.

## 2017-05-21 NOTE — Progress Notes (Addendum)
Patient ID: Emma Glenn, female   DOB: 07/04/1965, 52 y.o.   MRN: [id number]   HPI: Emma Glenn is a 52 y.o.-year-old female, initially referred by her GI Dr, Dr. Hilarie Fredrickson, for evaluation and tx of high urinary cortisol. She also had very uncontrolled DM2, insulin dependent (but refused insulin in the past) and hypothyroidism. Last visit  3 mo ago.  DM2: Reviewed HbA1c levels: Lab Results  Component Value Date   HGBA1C 9.0 02/18/2017   HGBA1C 9.5 10/11/2016   HGBA1C 12.7 07/02/2016  Labs from [place], drawn on 07/29/2016: HbA1c still very high, at 12.6%  Pt is on a regimen of: - Lantus 30 >> 35 units at bedtime - Metformin ER 500 mg 2x a day - she tolerates this only with dicyclomine due to diarrhea - Trulicity 1.5 mg weekly  Pt checks sugars 1-2 times a day: - am: 203-232 >> 152-239, 254 >> 160-170 >> 122-130 - 2h after b'fast: n/c - before lunch: n/c - 2h after lunch: n/c - before dinner: n/c >> 150 >> 140-170 (snack0 - 2h after dinner: n/c >> 170 - bedtime: n/c  - nighttime: n/c Lowest sugar was 155 >> 122; it is unclear whether she has hypoglycemia awareness or not. Highest sugar was 178 >> 174  [name] meals are: - Breakfast: Cereals or 2 eggs and toast - Lunch: Skips - Dinner: Meat and veggies - Snacks: Fruit   Meter: Bayer contour   -No CKD, last BUN/creatinine:  Lab Results  Component Value Date   BUN 12 12/10/2016   BUN 14 07/31/2016   CREATININE 0.64 12/10/2016   CREATININE 0.93 07/31/2016  07/29/2016: UACR was high, 148.9 -+ HL; last set of lipids: Lab Results  Component Value Date   CHOL 124 02/18/2017   HDL 17.50 (L) 02/18/2017   LDLCALC 79 02/18/2017   TRIG 137.0 02/18/2017   CHOLHDL 7 02/18/2017   We started pravastatin 12/2016, after we consulted with her gastroenterologist, Dr. Hilarie Fredrickson.  LFTs remained stable after starting the statin.  Component     Latest Ref Rng & Units 09/23/2016 12/10/2016 01/13/2017  Total Bilirubin     0.2 - 1.2 mg/dL 0.6  0.7 0.6  Alkaline Phosphatase     39 - 117 U/L 74 71 63  AST     0 - 37 U/L 80 (H) 104 (H) 96 (H)  ALT     0 - 35 U/L 64 (H) 70 (H) 73 (H)  Total Protein     6.0 - 8.3 g/dL 7.9 8.5 (H) 8.8 (H)  Albumin     3.5 - 5.2 g/dL 4.5 4.3 4.5  Calcium     8.4 - 10.5 mg/dL  10.2   GFR     >60.00 mL/min  103.22   Bilirubin, Direct     0.0 - 0.3 mg/dL 0.3  0.1   - last eye exam: 01/2015: Reportedly no DR reportedly - No numbness and tingling in her feet.  Hypothyroidism:  Pt is on levothyroxine 150 mcg daily, taken: - now every day - in am - fasting - at least 30 min from b'fast - no Ca, Fe, MVI - We have moved her PPIs to dinnertime - not on Biotin  Reviewed most recent TFTs -last TSH was higher, suspected noncompliance.  We did not change her levothyroxine dose then. Lab Results  Component Value Date   TSH 17.64 (H) 02/18/2017   FREET4 0.96 02/18/2017  07/29/2016: TSH was high: 6.18.   Reviewed previous history: She  started to have severe generalized mm cramps in 03/2016.   Around that time >> RUQ pain >> HIDA scan >> GB w/o clear stones, but with sludge. She had an abd. CT scan: increased size of her liver + liver steatosis.  She was also found to have a 24-hour urine free cortisol, which returned slightly elevated: Component     Latest Ref Rng & Units 06/04/2016 06/14/2016  Cortisol (Ur), Free     4.0 - 50.0 mcg/24 h  57.0 (H)  Results received     0.63 - 2.50 g/24 h  1.27  Cortisol, Plasma     Ug/dL (collected at 5 pm) 6.8    She is not on OCPs.  A CT abdomen (05/2016) showed normal adrenals.  A brain MRI (04/2015) showed normal pituitary gland.  At last visit, we checked a dexamethasone suppression test: normal >> no signs of Cushing syndrome:  Component     Latest Ref Rng & Units 07/02/2016  Cortisol - AM     mcg/dL 1.8 (L)  Dexamethasone, Serum     ng/dL 385   She had a liver Bx  >> has level 3 scarring. 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  ROS: Constitutional: no weight gain/no weight loss, no fatigue, no subjective hyperthermia, no subjective hypothermia Eyes: no blurry vision, no xerophthalmia ENT: no sore throat, no nodules palpated in throat, no dysphagia, no odynophagia, no hoarseness Cardiovascular: no CP/no SOB/no palpitations/no leg swelling Respiratory: no cough/no SOB/no wheezing Gastrointestinal: no N/no V/no D/no C/no acid reflux Musculoskeletal: no muscle aches/no joint aches Skin: no rashes, no hair loss Neurological: no tremors/no numbness/no tingling/no dizziness  I reviewed pt's medications, allergies, PMH, social hx, family hx, and changes were documented in the history of present illness. Otherwise, unchanged from my initial visit note.  Past Medical History:  Diagnosis Date  . CIN I (cervical intraepithelial neoplasia I)   . COPD (chronic obstructive pulmonary disease) ([place])   . GERD (gastroesophageal reflux disease)   . Hepatic steatosis   . Hypertension   . Hypothyroidism   . Labial cyst    Inclusion cyst  . Positive ANA (antinuclear antibody)   . Seizures ([place])    last seizure 3/17? over dose wellbutrin  . Thyroid disease    Graves dis-Radioactive Iodine   Past Surgical History:  Procedure Laterality Date  . CERVICAL BIOPSY  W/ LOOP ELECTRODE EXCISION     Excision labial inclusion cyst  . CESAREAN SECTION    . COLPOSCOPY    . HAND SURGERY    . SHOULDER ARTHROSCOPY WITH OPEN ROTATOR CUFF REPAIR AND DISTAL CLAVICLE ACROMINECTOMY Left 06/13/2015   Procedure: LEFT SHOULDER ARTHROSCOPY, DEBRIDEMENT, OPEN ROTATOR CUFF REPAIR, CORACOID FRACTURE FIXATION, BICEPS TENODESIS;  Surgeon: Meredith Pel, MD;  Location: [facility];  Service: Orthopedics;  Laterality: Left;  . SHOULDER CLOSED REDUCTION Left 09/26/2015   Procedure: CLOSED MANIPULATION SHOULDER UNDER ANESTHESIA;  Surgeon: Meredith Pel, MD;  Location: [facility];  Service: Orthopedics;  Laterality: Left;  . TUBAL LIGATION      Social History   Social History  . Marital status: Married    Spouse name: Tommie Raymond  . Number of children: 1  . Years of education: 32   Occupational History  . Material handler    Social History Main Topics  . Smoking status: Former Smoker    Packs/day: 1.00    Types: Cigarettes    Quit date: 02/14/2015  . Smokeless  tobacco: Never Used  . Alcohol use 0.0 oz/week     Comment: rare  . Drug use: No     Comment: Hx of marijuana use  . Sexual activity: Yes    Birth control/ protection: Surgical   Social History Narrative   Lives with husband and sister   Caffeine use: Tea/coffee daily      Current Outpatient Medications on File Prior to Visit  Medication Sig Dispense Refill  . dicyclomine (BENTYL) 20 MG tablet Take 1 tablet (20 mg total) by mouth 3 (three) times daily as needed for spasms (increased abdominal pain). 90 tablet 2  . Dulaglutide (TRULICITY) 1.02 HE/5.2DP SOPN Inject 0.75 mg weekly under skin 4 pen 1  . glucose blood (BAYER CONTOUR TEST) test strip Use 2x a day 200 each 3  . Glycopyrrolate-Formoterol (BEVESPI AEROSPHERE) 9-4.8 MCG/ACT AERO Inhale 2 puffs into the lungs 2 (two) times daily. 10.7 g 6  . Insulin Glargine (LANTUS SOLOSTAR) 100 UNIT/ML Solostar Pen INJECT 35 UNITS INTO THE SKIN AT BEDTIME 30 mL 3  . Insulin Pen Needle (CAREFINE PEN NEEDLES) 32G X 4 MM MISC Use 1x a day 100 each 3  . levothyroxine (SYNTHROID, LEVOTHROID) 150 MCG tablet TAKE ONE TABLET BY MOUTH EVERY MORNING 60 tablet 0  . losartan (COZAAR) 50 MG tablet Take 50 mg by mouth daily.    . metFORMIN (GLUCOPHAGE-XR) 500 MG 24 hr tablet Take 2 tablets daily 180 tablet 3  . omeprazole (PRILOSEC) 40 MG capsule Take 1 capsule (40 mg total) by mouth daily. 30 capsule 3  . ondansetron (ZOFRAN ODT) 4 MG disintegrating tablet Take 1 tablet (4 mg total) by mouth every 8 (eight) hours as needed for nausea or vomiting. 30 tablet 0  . pravastatin (PRAVACHOL) 20 MG tablet TAKE ONE TABLET BY MOUTH EVERY  DAY 30 tablet 2   No current facility-administered medications on file prior to visit.    Allergies  Allergen Reactions  . Doxycycline Other (See Comments)    Flu like symptoms (high fever, chills)  . Nitrofurantoin Monohyd Macro Other (See Comments)    Flu like symptoms (high fever, chills)  . Septra [Sulfamethoxazole-Trimethoprim] Other (See Comments)    Flu like symptoms (high fever, chills)  . Lamictal [Lamotrigine] Rash   Family History  Problem Relation Age of Onset  . Hypertension Mother   . Heart disease Mother   . Diabetes Father   . Heart disease Father   . Heart disease Brother   . Breast cancer Maternal Grandmother   . Seizures Neg Hx   . Stomach cancer Neg Hx   . Colon cancer Neg Hx    PE: BP 112/70   Pulse 88   Ht 5' 2"  (1.575 m)   Wt 160 lb 9.6 oz (72.8 kg)   SpO2 96%   BMI 29.37 kg/m  Wt Readings from Last 3 Encounters:  05/21/17 160 lb 9.6 oz (72.8 kg)  02/25/17 166 lb 6.4 oz (75.5 kg)  02/18/17 167 lb 4.8 oz (75.9 kg)   Constitutional: overweight, in NAD Eyes: PERRLA, EOMI, no exophthalmos ENT: moist mucous membranes, no thyromegaly, no cervical lymphadenopathy Cardiovascular: RRR, No MRG Respiratory: CTA B Gastrointestinal: abdomen soft, NT, ND, BS+ Musculoskeletal: no deformities, strength intact in all 4 Skin: moist, warm, no rashes Neurological: no tremor with outstretched hands, DTR normal in all 4  ASSESSMENT: 1. DM2   2. Hypothyroidism   3. HL  PLAN: 1. DM2 -Patient with long-standing, uncontrolled, type 2 diabetes, on metformin, GLP-1  receptor agonist, and Lantus.  Before last visit, she was off her insulin for 1 week due to insurance changes.  Sugars have improved after she restarted it.  At last visit, HbA1c was 9%, improved from 12%. Yesterday, at the hepatologist office HbA1c was 7.7% (much better)! - At today's visit, her sugars are at goal in the morning and at bedtime, but they are still higher than target before dinner.  She  tells me that she occasionally has a snack after dinner. - I would not change her medicine regimen for now, but we discussed about improving lunch (decreasing the size of the meal) and also including physical activity. - We will refer her to nutrition per her request - I suggested to:  Patient Instructions  Please continue: - Lantus 35 units at bedtime - Metformin ER 500 mg 2x a day - Trulicity 1.5 mg weekly  Please continue Levothyroxine 150 mcg daily.  Take the thyroid hormone every day, with water, at least 30 minutes before breakfast, separated by at least 4 hours from: - acid reflux medications - calcium - iron - multivitamins  Please stop at the lab.  Please schedule a new eye appt.  Please schedule an appt with Antonieta Iba with nutrition.  Please return in 3 months with your sugar log.    - continue checking sugars at different times of the day - check 2x a day, rotating checks - advised for yearly eye exams >> she is UTD - Return to clinic in 3 mo with sugar log     2. Hypothyroidism - uncontrolled   - latest thyroid labs reviewed with pt >> higher, at 17.64 (poss missing doses), but since last visit she tells me that she is taking it every day - she continues on LT4 150 mcg daily - pt feels good on this dose. - we discussed about taking the thyroid hormone every day, with water, >30 minutes before breakfast, separated by >4 hours from acid reflux medications, calcium, iron, multivitamins. Pt. is taking it correctly. - will check thyroid tests today: TSH and fT4 - If labs are abnormal, she will need to return for repeat TFTs in 1.5 months  3. HL  - reviewed together her previous Lipid panel from last visit.  Cholesterol fractions were abnormal so I suggested to start pravastatin.  Subsequent LFTs remained stable and her lipid panel improved, with now LDL cholesterol at goal. - we will check her Lipids again today - continues Pravastatin w/o SEs   Abstract on  05/21/2017  Component Date Value Ref Range Status  . Hemoglobin A1C 05/21/2017 7.7   Final  Office Visit on 05/21/2017  Component Date Value Ref Range Status  . TSH 05/21/2017 2.65  0.35 - 4.50 uIU/mL Final  . Free T4 05/21/2017 1.36  0.60 - 1.60 ng/dL Final   Comment: Specimens from patients who are undergoing biotin therapy and /or ingesting biotin supplements may contain high levels of biotin.  The higher biotin concentration in these specimens interferes with this Free T4 assay.  Specimens that contain high levels  of biotin may cause false high results for this Free T4 assay.  Please interpret results in light of the total clinical presentation of the patient.     Thyroid tests are normal!  Addendum (05/27/2017): Reviewed labs from [place], drawn on 05/20/2017: CMP normal except high glucose 164 and high AST/ALT: 63/48 HbA1c 7.7%  Philemon Kingdom, MD PhD [facility] Endocrin[facility]

## 2017-05-28 ENCOUNTER — Telehealth: Payer: Self-pay | Admitting: Internal Medicine

## 2017-05-28 NOTE — Telephone Encounter (Signed)
Past US's have not shown gallstones, that said there still could be gallbladder dysfunction Can proceed with [facility] HIDA

## 2017-05-28 NOTE — Telephone Encounter (Signed)
Patient states she is still having nausea and diarrhea and thinks it is her gallbladder. Patient wants to know what she should do.

## 2017-05-28 NOTE — Telephone Encounter (Signed)
Pt states she has been having problems with nausea and diarrhea for [redacted] weeks along with some RUQ pain. Pt wonders if it is her gallbladder causing these symptoms. States Dr. Hilarie Fredrickson mentioned maybe needing to recheck/relook at the gallbladder if she continued to have problems. Please advise.

## 2017-05-29 ENCOUNTER — Other Ambulatory Visit: Payer: Self-pay

## 2017-05-29 DIAGNOSIS — R1011 Right upper quadrant pain: Secondary | ICD-10-CM

## 2017-05-29 NOTE — Telephone Encounter (Signed)
Pt aware and scheduled for HIDA scan at [facility] 06/10/17@7 :30am, arrival time 7:15am. Pt to be NPO after midnight.

## 2017-05-30 ENCOUNTER — Other Ambulatory Visit: Payer: Self-pay | Admitting: Internal Medicine

## 2017-05-30 DIAGNOSIS — K7581 Nonalcoholic steatohepatitis (NASH): Secondary | ICD-10-CM

## 2017-06-10 ENCOUNTER — Encounter (HOSPITAL_COMMUNITY): Payer: Self-pay | Admitting: Radiology

## 2017-06-10 ENCOUNTER — Encounter (HOSPITAL_COMMUNITY)
Admission: RE | Admit: 2017-06-10 | Discharge: 2017-06-10 | Disposition: A | Discharge status: Home / Self Care | Payer: BLUE CROSS/BLUE SHIELD | Source: Ambulatory Visit | Attending: Internal Medicine | Admitting: Internal Medicine

## 2017-06-10 DIAGNOSIS — R1011 Right upper quadrant pain: Secondary | ICD-10-CM | POA: Insufficient documentation

## 2017-06-10 MED ORDER — TECHNETIUM TC 99M MEBROFENIN IV KIT
5.2000 | PACK | Freq: Once | INTRAVENOUS | Status: AC | PRN
Start: 2017-06-10 — End: 2017-06-10
  Administered 2017-06-10: 5.2 via INTRAVENOUS

## 2017-06-11 ENCOUNTER — Other Ambulatory Visit: Payer: Self-pay | Admitting: Internal Medicine

## 2017-06-11 ENCOUNTER — Ambulatory Visit
Admission: RE | Admit: 2017-06-11 | Discharge: 2017-06-11 | Disposition: A | Discharge status: Home / Self Care | Payer: BLUE CROSS/BLUE SHIELD | Source: Ambulatory Visit | Attending: Internal Medicine | Admitting: Internal Medicine

## 2017-06-11 DIAGNOSIS — K7581 Nonalcoholic steatohepatitis (NASH): Secondary | ICD-10-CM

## 2017-06-12 ENCOUNTER — Ambulatory Visit
Admission: RE | Admit: 2017-06-12 | Discharge: 2017-06-12 | Disposition: A | Discharge status: Home / Self Care | Payer: BLUE CROSS/BLUE SHIELD | Source: Ambulatory Visit | Attending: Internal Medicine | Admitting: Internal Medicine

## 2017-06-12 DIAGNOSIS — K7581 Nonalcoholic steatohepatitis (NASH): Secondary | ICD-10-CM

## 2017-06-16 ENCOUNTER — Encounter: Payer: BLUE CROSS/BLUE SHIELD | Admitting: Nutrition

## 2017-07-15 ENCOUNTER — Encounter: Payer: Self-pay | Admitting: Pulmonary Disease

## 2017-07-15 ENCOUNTER — Ambulatory Visit (INDEPENDENT_AMBULATORY_CARE_PROVIDER_SITE_OTHER): Payer: BLUE CROSS/BLUE SHIELD | Admitting: Pulmonary Disease

## 2017-07-15 VITALS — BP 140/80 | HR 92 | Ht 62.0 in | Wt 158.4 lb

## 2017-07-15 DIAGNOSIS — R0600 Dyspnea, unspecified: Secondary | ICD-10-CM

## 2017-07-15 DIAGNOSIS — Z23 Encounter for immunization: Secondary | ICD-10-CM | POA: Diagnosis not present

## 2017-07-15 DIAGNOSIS — J439 Emphysema, unspecified: Secondary | ICD-10-CM

## 2017-07-15 NOTE — Progress Notes (Signed)
Inhaler training given. In-check peak flow #: 45

## 2017-07-15 NOTE — Progress Notes (Signed)
Emma Glenn    [id number]    06-08-1965  Primary Care Physician:Harris, Gwyndolyn Saxon, MD  Referring Physician: Shirline Frees, MD [place], [place] [address]  Chief complaint:   Follow up for COPD GOLD B (CAT score 11, 0 exacerbations over past year)  HPI: Emma Glenn is a 52 year old former smoker with past history of seizures, hypothyroidism, hypertension. She was hospitalized in March 2017 for a seizure episode which was thought to be secondary to Wellbutrin. She has been discharged on Depakote with no recurrence of issues. She's had a fall during this incident with left shoulder rotator cuff injury. She underwent a swallow arthroscopy and 06/14/15. She has been inactive since his surgery and out of work. She's noticed a 50 pound weight gain. She is now back to work for the past month but has not lost weight yet.  Her chief complaint is progressive dyspnea on exertion which started after her surgery. She also has some dyspnea at rest. She denies any cough, sputum production, wheezing. She does not have any chest pain, palpitations.   Interim History: She has been diagnosed with cirrhosis secondary to [facility] after liver biopsy and is also being worked up for autoimmune causes given positive ANA and smooth muscle antibody.  She was evaluated by Dr. Amil Amen, rheumatology and work-up was negative as per the patient.  Continues on bevespi.  She is only using it occasionally.  Complains of dyspnea on exertion, cough with white mucus.  Outpatient Encounter Medications as of 07/15/2017  Medication Sig  . dicyclomine (BENTYL) 20 MG tablet Take 1 tablet (20 mg total) by mouth 3 (three) times daily as needed for spasms (increased abdominal pain).  . Dulaglutide (TRULICITY) 3.47 QQ/5.9DG SOPN Inject 0.75 mg weekly under skin  . glucose blood (BAYER CONTOUR TEST) test strip Use 2x a day  . Glycopyrrolate-Formoterol (BEVESPI AEROSPHERE) 9-4.8 MCG/ACT AERO Inhale 2 puffs  into the lungs 2 (two) times daily.  . Insulin Glargine (LANTUS SOLOSTAR) 100 UNIT/ML Solostar Pen INJECT 35 UNITS INTO THE SKIN AT BEDTIME  . Insulin Pen Needle (CAREFINE PEN NEEDLES) 32G X 4 MM MISC Use 1x a day  . levothyroxine (SYNTHROID, LEVOTHROID) 150 MCG tablet TAKE ONE TABLET BY MOUTH EVERY MORNING  . losartan (COZAAR) 50 MG tablet Take 50 mg by mouth daily.  . metFORMIN (GLUCOPHAGE-XR) 500 MG 24 hr tablet Take 2 tablets daily  . omeprazole (PRILOSEC) 40 MG capsule Take 1 capsule (40 mg total) by mouth daily.  . ondansetron (ZOFRAN ODT) 4 MG disintegrating tablet Take 1 tablet (4 mg total) by mouth every 8 (eight) hours as needed for nausea or vomiting.  . pravastatin (PRAVACHOL) 20 MG tablet TAKE ONE TABLET BY MOUTH EVERY DAY   No facility-administered encounter medications on file as of 07/15/2017.     Allergies as of 07/15/2017 - Review Complete 07/15/2017  Allergen Reaction Noted  . Doxycycline Other (See Comments) 06/06/2015  . Nitrofurantoin monohyd macro Other (See Comments) 02/19/2011  . Septra [sulfamethoxazole-trimethoprim] Other (See Comments) 06/06/2015  . Lamictal [lamotrigine] Rash 09/15/2015    Past Medical History:  Diagnosis Date  . CIN I (cervical intraepithelial neoplasia I)   . COPD (chronic obstructive pulmonary disease) ([place])   . GERD (gastroesophageal reflux disease)   . Hepatic steatosis   . Hypertension   . Hypothyroidism   . Labial cyst    Inclusion cyst  . Positive ANA (antinuclear antibody)   . Seizures ([place])  last seizure 3/17? over dose wellbutrin  . Thyroid disease    Graves dis-Radioactive Iodine    Past Surgical History:  Procedure Laterality Date  . CERVICAL BIOPSY  W/ LOOP ELECTRODE EXCISION     Excision labial inclusion cyst  . CESAREAN SECTION    . COLPOSCOPY    . HAND SURGERY    . SHOULDER ARTHROSCOPY WITH OPEN ROTATOR CUFF REPAIR AND DISTAL CLAVICLE ACROMINECTOMY Left 06/13/2015   Procedure: LEFT SHOULDER ARTHROSCOPY,  DEBRIDEMENT, OPEN ROTATOR CUFF REPAIR, CORACOID FRACTURE FIXATION, BICEPS TENODESIS;  Surgeon: Meredith Pel, MD;  Location: [facility];  Service: Orthopedics;  Laterality: Left;  . SHOULDER CLOSED REDUCTION Left 09/26/2015   Procedure: CLOSED MANIPULATION SHOULDER UNDER ANESTHESIA;  Surgeon: Meredith Pel, MD;  Location: [facility];  Service: Orthopedics;  Laterality: Left;  . TUBAL LIGATION      Family History  Problem Relation Age of Onset  . Hypertension Mother   . Heart disease Mother   . Diabetes Father   . Heart disease Father   . Heart disease Brother   . Breast cancer Maternal Grandmother   . Seizures Neg Hx   . Stomach cancer Neg Hx   . Colon cancer Neg Hx     Social History   Socioeconomic History  . Marital status: Married    Spouse name: Tommie Raymond  . Number of children: 1  . Years of education: 53  . Highest education level: Not on file  Occupational History  . Not on file  Social Needs  . Financial resource strain: Not on file  . Food insecurity:    Worry: Not on file    Inability: Not on file  . Transportation needs:    Medical: Not on file    Non-medical: Not on file  Tobacco Use  . Smoking status: Former Smoker    Packs/day: 1.00    Types: Cigarettes    Last attempt to quit: 02/14/2015    Years since quitting: 2.4  . Smokeless tobacco: Never Used  Substance and Sexual Activity  . Alcohol use: Yes    Alcohol/week: 0.0 oz    Comment: rare  . Drug use: No    Comment: Hx of marijuana use  . Sexual activity: Yes    Birth control/protection: Surgical  Lifestyle  . Physical activity:    Days per week: Not on file    Minutes per session: Not on file  . Stress: Not on file  Relationships  . Social connections:    Talks on phone: Not on file    Gets together: Not on file    Attends religious service: Not on file    Active member of club or organization: Not on file    Attends meetings of clubs or organizations: Not on file    Relationship status: Not  on file  . Intimate partner violence:    Fear of current or ex partner: Not on file    Emotionally abused: Not on file    Physically abused: Not on file    Forced sexual activity: Not on file  Other Topics Concern  . Not on file  Social History Narrative   Lives with husband and sister   Caffeine use: Tea/coffee daily   Review of systems: Review of Systems  Constitutional: Negative for fever and chills.  HENT: Negative.   Eyes: Negative for blurred vision.  Respiratory: as per HPI  Cardiovascular: Negative for chest pain and palpitations.  Gastrointestinal: Negative for vomiting, diarrhea, blood per  rectum. Genitourinary: Negative for dysuria, urgency, frequency and hematuria.  Musculoskeletal: Negative for myalgias, back pain and joint pain.  Skin: Negative for itching and rash.  Neurological: Negative for dizziness, tremors, focal weakness, seizures and loss of consciousness.  Endo/Heme/Allergies: Negative for environmental allergies.  Psychiatric/Behavioral: Negative for depression, suicidal ideas and hallucinations.  All other systems reviewed and are negative.  Physical Exam: Blood pressure 140/80, pulse 92, height 5' 2"  (1.575 m), weight 158 lb 6.4 oz (71.8 kg), last menstrual period 06/23/2016, SpO2 98 %. Gen:      No acute distress HEENT:  EOMI, sclera anicteric Neck:     No masses; no thyromegaly Lungs:    Clear to auscultation bilaterally; normal respiratory effort CV:         Regular rate and rhythm; no murmurs Abd:      + bowel sounds; soft, non-tender; no palpable masses, no distension Ext:    No edema; adequate peripheral perfusion Skin:      Warm and dry; no rash Neuro: alert and oriented x 3 Psych: normal mood and affect  Data Reviewed: Imaging Chest x-ray 01/26/16-no acute cardiopulmonary disease Chest x-ray 02/18/14-no acute cardiopulmonary disease Chest x-ray 04/02/13-no acute cardiopulmonary disease Chest x-ray 09/30/12-no acute cardiopulmonary disease I  have reviewed all images personally  PFTs 05/10/16 FVC 2.54 (70%), FEV1 1.75 (65%), F/F 69, TLC 76%, DLCO 95% Moderate obstruction with mild restriction.  Labs Alpha-1 antitrypsin 06/04/2016-230 ANA 06/04/2016- homogeneous 1:320, negative mitochondrial antibody.  Positive smooth muscle antibody IgG 1330, IgA 255  CBC 12/10/2016-WBC 9.6, eos 3.2%, absolute eosinophil count 307   Assessment:  COPD GOLD B PFTs reviewed with her today. They show moderate obstruction consistent with COPD.  Alpha-1 antitrypsin levels are normal  Continue on bevespi.  I have encouraged her to start using it regularly twice daily  Ex smoker Start lung cancer screening at age 71.  Health maintenance 12/13/2016-influenza Give Pneumovax today  Plan/Recommendations: - Continue bevespi.  Start using it twice daily - Pneumovax vaccination.  Marshell Garfinkel MD [place] [facility] 07/15/2017, 9:41 AM  CC: Shirline Frees, MD

## 2017-07-15 NOTE — Patient Instructions (Signed)
Please start using the bevespi regularly twice a day We will give you a pneumonia vaccine today Follow-up in 3 months.

## 2017-07-15 NOTE — Addendum Note (Signed)
Addended by: Len Blalock on: 07/15/2017 10:08 AM   Modules accepted: Orders

## 2017-07-16 ENCOUNTER — Other Ambulatory Visit: Payer: Self-pay | Admitting: Pulmonary Disease

## 2017-07-16 ENCOUNTER — Other Ambulatory Visit: Payer: Self-pay | Admitting: Internal Medicine

## 2017-07-16 DIAGNOSIS — J439 Emphysema, unspecified: Secondary | ICD-10-CM

## 2017-07-28 ENCOUNTER — Encounter: Payer: Self-pay | Admitting: *Deleted

## 2017-08-18 ENCOUNTER — Encounter: Payer: Self-pay | Admitting: Internal Medicine

## 2017-08-18 ENCOUNTER — Ambulatory Visit (INDEPENDENT_AMBULATORY_CARE_PROVIDER_SITE_OTHER): Payer: BLUE CROSS/BLUE SHIELD | Admitting: Internal Medicine

## 2017-08-18 ENCOUNTER — Other Ambulatory Visit (INDEPENDENT_AMBULATORY_CARE_PROVIDER_SITE_OTHER): Payer: BLUE CROSS/BLUE SHIELD

## 2017-08-18 VITALS — BP 110/74 | HR 88 | Ht 62.0 in | Wt 161.0 lb

## 2017-08-18 DIAGNOSIS — K7581 Nonalcoholic steatohepatitis (NASH): Secondary | ICD-10-CM

## 2017-08-18 DIAGNOSIS — R1011 Right upper quadrant pain: Secondary | ICD-10-CM

## 2017-08-18 DIAGNOSIS — R110 Nausea: Secondary | ICD-10-CM

## 2017-08-18 DIAGNOSIS — G8929 Other chronic pain: Secondary | ICD-10-CM | POA: Diagnosis not present

## 2017-08-18 DIAGNOSIS — L500 Allergic urticaria: Secondary | ICD-10-CM

## 2017-08-18 DIAGNOSIS — K7460 Unspecified cirrhosis of liver: Secondary | ICD-10-CM | POA: Diagnosis not present

## 2017-08-18 DIAGNOSIS — K219 Gastro-esophageal reflux disease without esophagitis: Secondary | ICD-10-CM | POA: Diagnosis not present

## 2017-08-18 LAB — CBC WITH DIFFERENTIAL/PLATELET
Basophils Absolute: 0.1 10*3/uL (ref 0.0–0.1)
Basophils Relative: 1.3 % (ref 0.0–3.0)
EOS PCT: 3.9 % (ref 0.0–5.0)
Eosinophils Absolute: 0.4 10*3/uL (ref 0.0–0.7)
HCT: 39.1 % (ref 36.0–46.0)
HEMOGLOBIN: 13.2 g/dL (ref 12.0–15.0)
LYMPHS ABS: 3.6 10*3/uL (ref 0.7–4.0)
LYMPHS PCT: 35.9 % (ref 12.0–46.0)
MCHC: 33.6 g/dL (ref 30.0–36.0)
MCV: 87.7 fl (ref 78.0–100.0)
MONOS PCT: 5.1 % (ref 3.0–12.0)
Monocytes Absolute: 0.5 10*3/uL (ref 0.1–1.0)
Neutro Abs: 5.4 10*3/uL (ref 1.4–7.7)
Neutrophils Relative %: 53.8 % (ref 43.0–77.0)
Platelets: 270 10*3/uL (ref 150.0–400.0)
RBC: 4.46 Mil/uL (ref 3.87–5.11)
RDW: 15.3 % (ref 11.5–15.5)
WBC: 10 10*3/uL (ref 4.0–10.5)

## 2017-08-18 LAB — COMPREHENSIVE METABOLIC PANEL
ALK PHOS: 52 U/L (ref 39–117)
ALT: 100 U/L — ABNORMAL HIGH (ref 0–35)
AST: 122 U/L — ABNORMAL HIGH (ref 0–37)
Albumin: 4.2 g/dL (ref 3.5–5.2)
BUN: 14 mg/dL (ref 6–23)
CALCIUM: 9.7 mg/dL (ref 8.4–10.5)
CO2: 28 mEq/L (ref 19–32)
Chloride: 102 mEq/L (ref 96–112)
Creatinine, Ser: 0.71 mg/dL (ref 0.40–1.20)
GFR: 91.32 mL/min (ref >60.00)
GLUCOSE: 167 mg/dL — AB (ref 70–99)
Potassium: 4.5 mEq/L (ref 3.5–5.1)
Sodium: 140 mEq/L (ref 135–145)
TOTAL PROTEIN: 8.4 g/dL — AB (ref 6.0–8.3)
Total Bilirubin: 0.6 mg/dL (ref 0.2–1.2)

## 2017-08-18 LAB — PROTIME-INR
INR: 1.3 ratio — ABNORMAL HIGH (ref 0.8–1.0)
PROTHROMBIN TIME: 15.2 s — AB (ref 9.6–13.1)

## 2017-08-18 MED ORDER — OMEPRAZOLE 40 MG PO CPDR: 40.0000 mg | DELAYED_RELEASE_CAPSULE | Freq: Every day | ORAL | 6 refills | Status: DC

## 2017-08-18 MED ORDER — ONDANSETRON 4 MG PO TBDP: 4.0000 mg | ORAL_TABLET | Freq: Two times a day (BID) | ORAL | 3 refills | Status: DC

## 2017-08-18 NOTE — Patient Instructions (Signed)
We have sent the following medications to your pharmacy for you to pick up at your convenience: Ondansetron twice daily Omeprazole 40 mg daily  Please purchase the following medications over the counter and take as directed: Zyrtec (for rash)  Your provider has requested that you go to the basement level for lab work before leaving today. Press "B" on the elevator. The lab is located at the first door on the left as you exit the elevator.  Contact Dr Amy Martinique for follow up of your rash.  You will be due for an abdominal ultrasound ([place] screening) in November 2019. We will contact you with an appointment when it gets closer to that time.  Please follow up with Dr Hilarie Fredrickson in 6-9 months.  If you are age 40 or older, your body mass index should be between 23-30. Your Body mass index is 29.45 kg/m. If this is out of the aforementioned range listed, please consider follow up with your Primary Care Provider.  If you are age 6 or younger, your body mass index should be between 19-25. Your Body mass index is 29.45 kg/m. If this is out of the aformentioned range listed, please consider follow up with your Primary Care Provider.

## 2017-08-18 NOTE — Progress Notes (Signed)
Subjective:    Patient ID: Emma Glenn, female    DOB: 07/10/1965, 52 y.o.   MRN: [id number]  HPI Emma Glenn is a 52 year old female with a past medical history of NASH with probable cirrhosis (F3/F4 by elastography and bridging fibrosis by biopsy), history of Graves' disease, diabetes, hypertension, hyperlipidemia, COPD who is here for follow-up.  She was last seen in January 2019.  She is here alone today.  She has a history of elevated AST and ALT, positive smooth muscle antibody with normal IgG, elevated ANA.  She is followed by [facility], rheumatology, endocrinology, dermatology, recently pulmonology and with primary care.  Since her last visit her liver biopsy was reviewed by [facility] pathologists in [place] and felt inflammation was that of NASH not autoimmune hepatitis.  She did follow-up with Dr. Zollie Scale in April 2019.  She reports that she developed 2 weeks of severe nausea in April.  We repeated a HIDA scan at that time which was normal.  I treated her with Zofran which she said helped tremendously.  Nausea overall is better but still she can have days where she has mild nausea.  She would like to use Zofran but she has been rationing this as her insurance company only approved 9 tablets/month.  She does continue to have her chronic right upper quadrant abdominal discomfort which at times can feel like a soreness and at other times being more severe.  She feels some abdominal bloating which seems to come and go and seems to be worse with eating.  No issue with lower extremity edema though her legs feel tight at times at the end of the day without visible swelling.  She has lost about 5 pounds since our last visit.  Her bowel movements range from formed occasionally to at times loose with urgency.  No blood in her stool or melena.  She has developed hives which are intensely pruritic which seem to come and go 2 to 3 days/week.  Can happen on her upper legs and thighs and also on her trunk  area.  No definite inciting events.  Has been using Benadryl which seems to help but makes her sleepy and foggy.  Most recently some lower back pain more on the right just above her buttocks for the last 2 days.  No GI bleeding or melena.  No jaundice.  She had a liver ultrasound in May performed after her visit with her pathology and this was negative for [facility].  She is following with Dr. Vaughan Browner and reports she has been told she likely has COPD   Review of Systems As per HPI, otherwise negative  Current Medications, Allergies, Past Medical History, Past Surgical History, Family History and Social History were reviewed in [facility] record.     Objective:   Physical Exam BP 110/74   Pulse 88   Ht 5' 2"  (1.575 m)   Wt 161 lb (73 kg)   LMP 06/23/2016   BMI 29.45 kg/m  Constitutional: Well-developed and well-nourished. No distress. HEENT: Normocephalic and atraumatic. Oropharynx is clear and moist. Conjunctivae are normal.  No scleral icterus. Neck: Neck supple. Trachea midline. Cardiovascular: Normal rate, regular rhythm and intact distal pulses. No M/R/G Pulmonary/chest: Effort normal and breath sounds normal. No wheezing, rales or rhonchi. Abdominal: Soft, diffusely tender more in the right upper quadrant without rebound or guarding, mild hepatomegaly with palpable liver edge to finger widths below the right costal margin, no palpable splenomegaly, nondistended. Bowel sounds  active throughout. Extremities: no clubbing, cyanosis, or edema Neurological: Alert and oriented to person place and time.  No asterixis Skin: Skin is warm and dry.  Palmar erythema noted Psychiatric: Normal mood and affect. Behavior is normal.  CBC    Component Value Date/Time   WBC 10.0 08/18/2017 1001   RBC 4.46 08/18/2017 1001   HGB 13.2 08/18/2017 1001   HCT 39.1 08/18/2017 1001   PLT 270.0 08/18/2017 1001   MCV 87.7 08/18/2017 1001   MCH 30.4 07/31/2016 1119   MCHC 33.6  08/18/2017 1001   RDW 15.3 08/18/2017 1001   LYMPHSABS 3.6 08/18/2017 1001   MONOABS 0.5 08/18/2017 1001   EOSABS 0.4 08/18/2017 1001   BASOSABS 0.1 08/18/2017 1001   CMP     Component Value Date/Time   NA 140 08/18/2017 1001   NA 137 06/25/2016 1347   K 4.5 08/18/2017 1001   CL 102 08/18/2017 1001   CO2 28 08/18/2017 1001   GLUCOSE 167 (H) 08/18/2017 1001   BUN 14 08/18/2017 1001   BUN 14 06/25/2016 1347   CREATININE 0.71 08/18/2017 1001   CALCIUM 9.7 08/18/2017 1001   PROT 8.4 (H) 08/18/2017 1001   ALBUMIN 4.2 08/18/2017 1001   AST 122 (H) 08/18/2017 1001   ALT 100 (H) 08/18/2017 1001   ALKPHOS 52 08/18/2017 1001   BILITOT 0.6 08/18/2017 1001   GFRNONAA >60 07/31/2016 1119   GFRAA >60 07/31/2016 1119   Lab Results  Component Value Date   INR 1.3 (H) 08/18/2017   INR 1.3 (H) 12/10/2016   INR 1.3 (H) 09/23/2016   : NUCLEAR MEDICINE HEPATOBILIARY IMAGING WITH GALLBLADDER EF   TECHNIQUE: Sequential images of the abdomen were obtained out to 60 minutes following intravenous administration of radiopharmaceutical. After oral ingestion of Ensure, gallbladder ejection fraction was determined. At 60 min, normal ejection fraction is greater than 33%.   RADIOPHARMACEUTICALS:  5.2 mCi Tc-71m Choletec IV   COMPARISON:  11/12/2016 ultrasound.   FINDINGS: Prompt uptake and biliary excretion of activity by the liver is seen. Gallbladder activity is visualized, consistent with patency of cystic duct. Biliary activity passes into small bowel, consistent with patent common bile duct.   Calculated gallbladder ejection fraction is 86%. (Normal gallbladder ejection fraction with Ensure is greater than 33%.)   Patient did not experience pain with ingestion of Ensure.   IMPRESSION: Normal gallbladder ejection fraction.     Electronically Signed   By: SGenia DelM.D.   On: 06/10/2017 17:50   ULTRASOUND ABDOMEN LIMITED RIGHT UPPER QUADRANT   COMPARISON:  Right upper  quadrant abdominal ultrasound dated November 12, 2016   FINDINGS: Gallbladder:   No gallstones or wall thickening visualized. No sonographic Murphy sign noted by sonographer.   Common bile duct:   Diameter: 3.6 mm   Liver:   The hepatic echotexture is heterogeneously increased. The surface contour is slightly irregular in some areas. There is no discrete mass or ductal dilation. Portal vein is patent on color Doppler imaging with normal direction of blood flow towards the liver.   IMPRESSION: Evidence of fatty infiltrative change of the liver. Possible early cirrhotic change manifested duct mild surface contour irregularity. No suspicious hepatic masses.   Normal gallbladder and common bile duct.     Electronically Signed   By: David  JMartiniqueM.D.   On: 06/12/2017 14:22       Assessment & Plan:  52year old female with a past medical history of NASH with probable cirrhosis (F3/F4 by  elastography and bridging fibrosis by biopsy), history of Graves' disease, diabetes, hypertension, hyperlipidemia, COPD who is here for follow-up.   1.  NASH with advanced fibrosis --probable early cirrhosis.  No evidence of varices at upper endoscopy June 2018. --Continue plans at weight loss, tight glucose control, controlling hypertension and hyperlipidemia --Liver ultrasound recommended every 6 months next November 2019 for [facility] screening --Variceal screening upper endoscopy recommended June 2020 --Maintain relationship with [facility] liver clinic --CBC, CMP and INR today  2.  Chronic right upper quadrant abdominal pain --unclear etiology, question capsular distention from hepatomegaly.  Stable.  Normal HIDA scan so I am not referring for cholecystectomy.  3.  Nausea --overall improved.  Will see if we can get more Zofran approved to use 4 mg every 8 hours as needed for severe nausea.  4.  GERD --history of reflux.  Continue omeprazole 40 mg once daily  5.  Urticarial rash --I asked that she  call Dr. Martinique, her dermatologist for follow-up for this issue.  In the interim can try Zyrtec 10 mg daily and Benadryl if needed per box instruction  6.  Disability --she has applied for disability and has sought advice from [employer].  I do feel that she meets criteria for disability given her liver disease and other chronic comorbidities.  I will write a letter to the same  Follow-up in 6 to 9 months, sooner if needed

## 2017-08-19 ENCOUNTER — Encounter: Payer: BLUE CROSS/BLUE SHIELD | Attending: Internal Medicine | Admitting: Nutrition

## 2017-08-19 DIAGNOSIS — Z713 Dietary counseling and surveillance: Secondary | ICD-10-CM | POA: Diagnosis not present

## 2017-08-19 DIAGNOSIS — Z6829 Body mass index (BMI) 29.0-29.9, adult: Secondary | ICD-10-CM | POA: Diagnosis not present

## 2017-08-19 DIAGNOSIS — E1165 Type 2 diabetes mellitus with hyperglycemia: Secondary | ICD-10-CM

## 2017-08-19 LAB — HM DIABETES EYE EXAM

## 2017-08-19 NOTE — Progress Notes (Signed)
Patient is here with her mother to learn about diet and diabetes.  She has never discussed diabetes diagnosis with anyone.  We talked about what diabetes is, and what she can do to decrease her insulin resistance and better her blood sugar control.  She reports that she has "gone off the wagon" with her diet, and wants to get back doing what is "right".  She is also wanting to loose weight.   Typical day: 9AM:  Bfast:  Honey Nut Cheerios with milk and coffee, or 2 eggs with 1 toast and 2 pieces of bacon, milk to drink 11AM  2-3 long graham Crackers.   Says does not eat anything until 5-5:30PM 5-5:30PM: supper:  Fast food some nights, with fried fish sandwich and "a few fries and 1/2 1/2 sweet tea to drink, or sit down restaurant like [employer].  Will have fried chicken with broccoli and cheese and 1/2  1/2 sweet tea.   Denies HS eating. Insulin.  Says is taking her Lantus-35u at HS, and her Trulicity once a week, and Metformin BID.   No exercise.  Says is very tired and has an auto immune problem, that she has no energy.    Discussed the idea of balanced meals what foods fall into each of the 3 categories.   She was shown how her meals are very high in fat/calories and suggestions were given for how to reduce the fats in her meal. Also told to test ac and 2hr. pc her cereal meal  If over 170, she was told to stop the cereal, other suggestions for breakfast meals given to her.   We also discussed exercise options for  2 min. Intervals. Walking, and doing this 10 times/day.  She was also given chair exercises to do while watching TV, to increase heart rate.

## 2017-08-20 NOTE — Patient Instructions (Signed)
Test blood sugar before and 2 hours after one meal each day.  If 2 hour reading is over 170, reduce meal portion sizes. Walk--try 2-3 min. 8-10X/day, or due chair exercises as discussed 30 min. Each day. Reduce fat by using less oil, and eating only one high fat choice per meal. Limit butter, salad dressings, fried foods, and sour cream and cream cheese portions.

## 2017-08-26 ENCOUNTER — Telehealth: Payer: Self-pay | Admitting: Nutrition

## 2017-08-26 NOTE — Telephone Encounter (Signed)
Phone call to patient to see how she is doing.  Pt. Reported that she has not done anything since seeing me.  Says is not testing his blood sugars.  Would not give me a reason, except to say that she is busy.  Will be sing Dr. Cruzita Lederer this week.

## 2017-08-27 ENCOUNTER — Ambulatory Visit (INDEPENDENT_AMBULATORY_CARE_PROVIDER_SITE_OTHER): Payer: BLUE CROSS/BLUE SHIELD | Admitting: Internal Medicine

## 2017-08-27 ENCOUNTER — Encounter: Payer: Self-pay | Admitting: Internal Medicine

## 2017-08-27 VITALS — BP 118/70 | HR 86 | Ht 62.0 in | Wt 162.6 lb

## 2017-08-27 DIAGNOSIS — E785 Hyperlipidemia, unspecified: Secondary | ICD-10-CM

## 2017-08-27 DIAGNOSIS — E1165 Type 2 diabetes mellitus with hyperglycemia: Secondary | ICD-10-CM

## 2017-08-27 DIAGNOSIS — E038 Other specified hypothyroidism: Secondary | ICD-10-CM

## 2017-08-27 LAB — POCT GLYCOSYLATED HEMOGLOBIN (HGB A1C): HEMOGLOBIN A1C: 7.4 % — AB (ref 4.0–5.6)

## 2017-08-27 NOTE — Progress Notes (Signed)
Patient ID: Emma Glenn, female   DOB: July 18, 1965, 52 y.o.   MRN: [id number]   HPI: Emma Glenn is a 52 y.o.-year-old female, initially referred by her GI Dr, Dr. Hilarie Fredrickson, for evaluation and tx of high urinary cortisol. She also had very uncontrolled DM2, insulin dependent (but refused insulin in the past) and hypothyroidism. Last visit 3 months ago.  Before she saw the nutritionist last week, she started again to drink milkshakes.  After she saw the nutritionist, she stopped.  Retrospectively, she noticed that her sugars were much higher when she was drinking the shakes.  However, she does not have any CBG checks afterwards.  DM2: Reviewed HbA1c levels: Lab Results  Component Value Date   HGBA1C 7.7 05/21/2017   HGBA1C 9.0 02/18/2017   HGBA1C 9.5 10/11/2016  Labs from [facility], drawn on 07/29/2016: HbA1c still very high, at 12.6%  Pt is on a regimen of: - Lantus 30 >> 35 units at bedtime - Metformin ER 500 mg 2x a day - she tolerates this only with dicyclomine due to diarrhea - Trulicity 1.5 mg weekly  Pt did not check many sugar since last visit, in fact, she only has few readings in the downloaded log: - am: 152-239, 254 >> 160-170 >> 122-130 >> 153-270 (milk shakes) - 2h after b'fast: n/c - before lunch: n/c - 2h after lunch: n/c - before dinner: n/c >> 150 >> 140-170 (snack) >> 90-186, 246 - 2h after dinner: n/c >> 170 - bedtime: n/c  - nighttime: n/c Lowest sugar was 155 >> 122 >> 90;  it is unclear at what level she has hypoglycemia awareness Highest sugar was 178 >> 174 >> 270  [name] meals are: - Breakfast: Cereals or 2 eggs and toast - Lunch: Skips - Dinner: Meat and veggies - Snacks: Fruit   Meter: Bayer Contour  -No CKD, last BUN/creatinine:  Lab Results  Component Value Date   BUN 14 08/18/2017   BUN 12 12/10/2016   CREATININE 0.71 08/18/2017   CREATININE 0.64 12/10/2016  07/29/2016: UACR was high, 148.9  -+ HL; last set of lipids: Lab Results  Component  Value Date   CHOL 124 02/18/2017   HDL 17.50 (L) 02/18/2017   LDLCALC 79 02/18/2017   TRIG 137.0 02/18/2017   CHOLHDL 7 02/18/2017   We started pravastatin 12/2016, after we consulted with her gastroenterologist, Dr. Hilarie Fredrickson.  LFTs remained stable after starting the statin, however, at last check earlier this month, they were higher.  Reviewed levels: Lab Results  Component Value Date   ALT 100 (H) 08/18/2017   AST 122 (H) 08/18/2017   ALKPHOS 52 08/18/2017   BILITOT 0.6 08/18/2017   Prev: Component     Latest Ref Rng & Units 09/23/2016 12/10/2016 01/13/2017  Total Bilirubin     0.2 - 1.2 mg/dL 0.6 0.7 0.6  Alkaline Phosphatase     39 - 117 U/L 74 71 63  AST     0 - 37 U/L 80 (H) 104 (H) 96 (H)  ALT     0 - 35 U/L 64 (H) 70 (H) 73 (H)  Total Protein     6.0 - 8.3 g/dL 7.9 8.5 (H) 8.8 (H)  Albumin     3.5 - 5.2 g/dL 4.5 4.3 4.5  Calcium     8.4 - 10.5 mg/dL  10.2   GFR     >60.00 mL/min  103.22   Bilirubin, Direct     0.0 - 0.3 mg/dL 0.3  0.1   - last eye exam: 08/2017: Reportedly no DR - nonumbness and tingling in her feet.  Hypothyroidism:  Pt is on levothyroxine 150 mcg daily, taken: - in am - fasting - at least 30 min from b'fast - no Ca, Fe, MVI - PPIs at bedtime - not on Biotin  Last set of TFTs were normal 3 months ago: Lab Results  Component Value Date   TSH 2.65 05/21/2017   FREET4 1.36 05/21/2017  07/29/2016: TSH was high: 6.18.   Reviewed previous history: She started to have severe generalized mm cramps in 03/2016.   Around that time >> RUQ pain >> HIDA scan >> GB w/o clear stones, but with sludge. She had an abd. CT scan: increased size of her liver + liver steatosis.  She was also found to have a 24-hour urine free cortisol, which returned slightly elevated: Component     Latest Ref Rng & Units 06/04/2016 06/14/2016  Cortisol (Ur), Free     4.0 - 50.0 mcg/24 h  57.0 (H)  Results received     0.63 - 2.50 g/24 h  1.27  Cortisol, Plasma      Ug/dL (collected at 5 pm) 6.8    She is not on OCPs.  A CT abdomen (05/2016) showed normal adrenals.  A brain MRI (04/2015) showed normal pituitary gland.  At last visit, we checked a dexamethasone suppression test: normal >> no signs of Cushing syndrome:  Component     Latest Ref Rng & Units 07/02/2016  Cortisol - AM     mcg/dL 1.8 (L)  Dexamethasone, Serum     ng/dL 385   She had a liver Bx  >> has level 3 scarring. 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  ROS: Constitutional: + weight gain/no weight loss, + fatigue, no subjective hyperthermia, no subjective hypothermia Eyes: no blurry vision, no xerophthalmia ENT: no sore throat, no nodules palpated in throat, no dysphagia, no odynophagia, no hoarseness Cardiovascular: no CP/+ SOB/no palpitations/no leg swelling Respiratory: no cough/+ SOB/no wheezing Gastrointestinal: + N/no V/+ D/no C/no acid reflux Musculoskeletal: + muscle aches/no joint aches Skin:+  Rash (hives - saw derm - started 07/2017), no hair loss Neurological: no tremors/no numbness/no tingling/no dizziness  I reviewed pt's medications, allergies, PMH, social hx, family hx, and changes were documented in the history of present illness. Otherwise, unchanged from my initial visit note.  Past Medical History:  Diagnosis Date  . CIN I (cervical intraepithelial neoplasia I)   . COPD (chronic obstructive pulmonary disease) ([place])   . GERD (gastroesophageal reflux disease)   . Hemochromatosis   . Hepatic steatosis   . Hypertension   . Hypothyroidism   . Labial cyst    Inclusion cyst  . Liver cirrhosis ([place])   . NASH (nonalcoholic steatohepatitis)   . Positive ANA (antinuclear antibody)   . Seizures ([place])    last seizure 3/17? over dose wellbutrin  . Thyroid disease    Graves dis-Radioactive Iodine   Past Surgical History:  Procedure Laterality Date  . CERVICAL BIOPSY  W/ LOOP ELECTRODE EXCISION     Excision labial  inclusion cyst  . CESAREAN SECTION    . COLPOSCOPY    . HAND SURGERY    . SHOULDER ARTHROSCOPY WITH OPEN ROTATOR CUFF REPAIR AND DISTAL CLAVICLE ACROMINECTOMY Left 06/13/2015   Procedure: LEFT SHOULDER ARTHROSCOPY, DEBRIDEMENT, OPEN ROTATOR CUFF REPAIR, CORACOID FRACTURE FIXATION, BICEPS TENODESIS;  Surgeon: Meredith Pel, MD;  Location: [facility]  OR;  Service: Orthopedics;  Laterality: Left;  . SHOULDER CLOSED REDUCTION Left 09/26/2015   Procedure: CLOSED MANIPULATION SHOULDER UNDER ANESTHESIA;  Surgeon: Meredith Pel, MD;  Location: [facility];  Service: Orthopedics;  Laterality: Left;  . TUBAL LIGATION     Social History   Social History  . Marital status: Married    Spouse name: Tommie Raymond  . Number of children: 1  . Years of education: 50   Occupational History  . Material handler    Social History Main Topics  . Smoking status: Former Smoker    Packs/day: 1.00    Types: Cigarettes    Quit date: 02/14/2015  . Smokeless tobacco: Never Used  . Alcohol use 0.0 oz/week     Comment: rare  . Drug use: No     Comment: Hx of marijuana use  . Sexual activity: Yes    Birth control/ protection: Surgical   Social History Narrative   Lives with husband and sister   Caffeine use: Tea/coffee daily      Current Outpatient Medications on File Prior to Visit  Medication Sig Dispense Refill  . BEVESPI AEROSPHERE 9-4.8 MCG/ACT AERO INHALE 2 PUFFS INTO THE LUNGS TWICE DAILY. REPLACES ANORO INHALER. 10.7 g 6  . dicyclomine (BENTYL) 20 MG tablet Take 1 tablet (20 mg total) by mouth 3 (three) times daily as needed for spasms (increased abdominal pain). 90 tablet 2  . Dulaglutide (TRULICITY) 4.82 NO/0.3BC SOPN Inject 0.75 mg weekly under skin 4 pen 1  . glucose blood (BAYER CONTOUR TEST) test strip Use 2x a day 200 each 3  . Insulin Glargine (LANTUS SOLOSTAR) 100 UNIT/ML Solostar Pen INJECT 35 UNITS INTO THE SKIN AT BEDTIME 30 mL 3  . Insulin Pen Needle (CAREFINE PEN NEEDLES) 32G X 4 MM MISC Use 1x a  day 100 each 3  . levothyroxine (SYNTHROID, LEVOTHROID) 150 MCG tablet TAKE ONE TABLET BY MOUTH EVERY MORNING 60 tablet 0  . losartan (COZAAR) 50 MG tablet Take 50 mg by mouth daily.    . metFORMIN (GLUCOPHAGE-XR) 500 MG 24 hr tablet Take 2 tablets daily 180 tablet 3  . omeprazole (PRILOSEC) 40 MG capsule Take 1 capsule (40 mg total) by mouth daily. 30 capsule 6  . ondansetron (ZOFRAN ODT) 4 MG disintegrating tablet Take 1 tablet (4 mg total) by mouth 2 (two) times daily. 60 tablet 3  . pravastatin (PRAVACHOL) 20 MG tablet TAKE ONE TABLET BY MOUTH EVERY DAY 30 tablet 2   No current facility-administered medications on file prior to visit.    Allergies  Allergen Reactions  . Doxycycline Other (See Comments)    Flu like symptoms (high fever, chills)  . Nitrofurantoin Monohyd Macro Other (See Comments)    Flu like symptoms (high fever, chills)  . Septra [Sulfamethoxazole-Trimethoprim] Other (See Comments)    Flu like symptoms (high fever, chills)  . Lamictal [Lamotrigine] Rash   Family History  Problem Relation Age of Onset  . Hypertension Mother   . Heart disease Mother   . Diabetes Father   . Heart disease Father   . Heart disease Brother   . Breast cancer Maternal Grandmother   . Seizures Neg Hx   . Stomach cancer Neg Hx   . Colon cancer Neg Hx    PE: BP 118/70   Pulse 86   Ht 5' 2"  (1.575 m)   Wt 162 lb 9.6 oz (73.8 kg)   LMP 06/23/2016   SpO2 96%   BMI 29.74 kg/m  Wt Readings from Last 3 Encounters:  08/27/17 162 lb 9.6 oz (73.8 kg)  08/20/17 162 lb (73.5 kg)  08/18/17 161 lb (73 kg)   Constitutional: overweight, in NAD Eyes: PERRLA, EOMI, no exophthalmos ENT: moist mucous membranes, no thyromegaly, no cervical lymphadenopathy Cardiovascular: RRR, No MRG Respiratory: CTA B Gastrointestinal: abdomen soft, NT, ND, BS+ Musculoskeletal: no deformities, strength intact in all 4 Skin: moist, warm, no rashes Neurological: no tremor with outstretched hands, DTR normal  in all 4  ASSESSMENT: 1. DM2   2. Hypothyroidism   3. HL  PLAN: 1. DM2 -Patient with long-standing, uncontrolled, type 2 diabetes, on metformin, GLP-1 receptor agonist, and Lantus.  Her HbA1c improved, however, it is not quite at goal yet.  At last visit, this was 7.7%.  Sugars were at goal in the morning and at bedtime but they were higher than target before dinner.  She was telling me that she had an occasional snack before dinner.  We did not change her regimen at that time.  I advised her to improve lunch and also start some formal physical activity.  She also saw nutrition since last visit >> she feels this helped a lot >> stoppped milkshakes since then - At this visit, unfortunately, she is not checking sugars after she stopped drinking the milk shakes.  I strongly advised her to stay off these high carb- high fat foods - We will continue her current regimen for now - today, HbA1c is 7.4% (slightly better) - I suggested to:  Patient Instructions  Please continue: - Lantus 35 units at bedtime - Metformin ER 500 mg 2x a day - Trulicity 1.5 mg weekly  Please continue Levothyroxine 150 mcg daily.  Take the thyroid hormone every day, with water, at least 30 minutes before breakfast, separated by at least 4 hours from: - acid reflux medications - calcium - iron - multivitamins  Please return in 3 months with your sugar log.    - strongly advised her to start checking sugars at different times of the day - check 1x a day, rotating checks - advised for yearly eye exams >> she is UTD - Return to clinic in 3 mo with sugar log      2. Hypothyroidism - latest thyroid labs reviewed with pt >> normal 05/2017 - she continues on LT4 150 mcg daily - pt feels good on this dose. - we discussed about taking the thyroid hormone every day, with water, >30 minutes before breakfast, separated by >4 hours from acid reflux medications, calcium, iron, multivitamins. Pt. is taking it  correctly.  3. HL  - Reviewed latest lipid panel from 02/2017: HDL very low, LDL at goal, triglycerides at goal Lab Results  Component Value Date   CHOL 124 02/18/2017   HDL 17.50 (L) 02/18/2017   LDLCALC 79 02/18/2017   TRIG 137.0 02/18/2017   CHOLHDL 7 02/18/2017  - Continues pravastatin without side effects.  Philemon Kingdom, MD PhD [facility] Endocrinology

## 2017-08-27 NOTE — Patient Instructions (Addendum)
Please continue: - Lantus 35 units at bedtime - Metformin ER 500 mg 2x a day - Trulicity 1.5 mg weekly  Please continue Levothyroxine 150 mcg daily.  Take the thyroid hormone every day, with water, at least 30 minutes before breakfast, separated by at least 4 hours from: - acid reflux medications - calcium - iron - multivitamins  Please return in 3 months with your sugar log.

## 2017-08-27 NOTE — Addendum Note (Signed)
Addended by: Drucilla Schmidt on: 08/27/2017 12:03 PM   Modules accepted: Orders

## 2017-09-11 ENCOUNTER — Other Ambulatory Visit: Payer: Self-pay | Admitting: Internal Medicine

## 2017-09-17 ENCOUNTER — Other Ambulatory Visit: Payer: Self-pay | Admitting: Internal Medicine

## 2017-09-18 ENCOUNTER — Other Ambulatory Visit: Payer: Self-pay | Admitting: Internal Medicine

## 2017-10-20 ENCOUNTER — Ambulatory Visit (INDEPENDENT_AMBULATORY_CARE_PROVIDER_SITE_OTHER): Payer: BLUE CROSS/BLUE SHIELD | Admitting: Pulmonary Disease

## 2017-10-20 ENCOUNTER — Encounter: Payer: Self-pay | Admitting: Pulmonary Disease

## 2017-10-20 VITALS — BP 116/68 | HR 98 | Ht 62.0 in | Wt 164.6 lb

## 2017-10-20 DIAGNOSIS — R0600 Dyspnea, unspecified: Secondary | ICD-10-CM

## 2017-10-20 DIAGNOSIS — J439 Emphysema, unspecified: Secondary | ICD-10-CM

## 2017-10-20 DIAGNOSIS — Z23 Encounter for immunization: Secondary | ICD-10-CM

## 2017-10-20 NOTE — Patient Instructions (Signed)
Glad that your breathing is stable Continue the Bevespi Follow-up in 1 year Please call us a call sooner if there is any change in his symptoms.

## 2017-10-20 NOTE — Progress Notes (Signed)
Emma Glenn    [id number]    [birth year]  Primary Care Physician:Harris, Gwyndolyn Saxon, MD  Referring Physician: Shirline Frees, MD [place], [place] [address]  Chief complaint:   Follow up for COPD GOLD B (CAT score 11, 0 exacerbations over past year)  HPI: Mrs. Patteson is a 52 year old former smoker with past history of seizures, hypothyroidism, hypertension. She was hospitalized in March 2017 for a seizure episode which was thought to be secondary to Wellbutrin. She has been discharged on Depakote with no recurrence of issues. She's had a fall during this incident with left shoulder rotator cuff injury. She underwent a swallow arthroscopy and 06/14/15. She has been inactive since his surgery and out of work. She's noticed a 50 pound weight gain. She is now back to work for the past month but has not lost weight yet.  She has been diagnosed with cirrhosis secondary to [facility] after liver biopsy and is also being worked up for autoimmune causes given positive ANA and smooth muscle antibody.  She was evaluated by Dr. Amil Amen, rheumatology and work-up was negative as per the patient.  Her chief complaint is progressive dyspnea on exertion which started after her surgery. She also has some dyspnea at rest. She denies any cough, sputum production, wheezing. She does not have any chest pain, palpitations.   Interim History: Continues on bevespi inhaler.  She started using this regularly but has not noticed any change in her symptoms Complains of dyspnea on exertion, no symptoms at rest.  Occasional cough with white mucus.  Physical Exam: Gen:      No acute distress HEENT:  EOMI, sclera anicteric Neck:     No masses; no thyromegaly Lungs:    Clear to auscultation bilaterally; normal respiratory effort CV:         Regular rate and rhythm; no murmurs Abd:      + bowel sounds; soft, non-tender; no palpable masses, no distension Ext:    No edema; adequate peripheral  perfusion Skin:      Warm and dry; no rash Neuro: alert and oriented x 3 Psych: normal mood and affect  Data Reviewed: Imaging Chest x-ray 01/26/16-no acute cardiopulmonary disease Chest x-ray 02/18/14-no acute cardiopulmonary disease Chest x-ray 04/02/13-no acute cardiopulmonary disease Chest x-ray 09/30/12-no acute cardiopulmonary disease I have reviewed all images personally  PFTs 05/10/16 FVC 2.54 (70%), FEV1 1.75 (65%), F/F 69, TLC 76%, DLCO 95% Moderate obstruction with mild restriction.  Labs Alpha-1 antitrypsin 06/04/2016-230 ANA 06/04/2016- homogeneous 1:320, negative mitochondrial antibody.  Positive smooth muscle antibody IgG 1330, IgA 255  CBC 12/10/2016-WBC 9.6, eos 3.2%, absolute eosinophil count 307   Assessment:  COPD GOLD B PFTs reviewed with her today. They show moderate obstruction consistent with COPD.  Alpha-1 antitrypsin levels are normal  Continue on bevespi.   Ex smoker Start lung cancer screening at age 8.  Health maintenance 12/13/2016-influenza, give the flu vaccine today 07/15/2017-Pneumovax  Plan/Recommendations: - Continue bevespi.   - Flu vaccination.  Outpatient Encounter Medications as of 10/20/2017  Medication Sig  . BEVESPI AEROSPHERE 9-4.8 MCG/ACT AERO INHALE 2 PUFFS INTO THE LUNGS TWICE DAILY. REPLACES ANORO INHALER.  Marland Kitchen dicyclomine (BENTYL) 20 MG tablet Take 1 tablet (20 mg total) by mouth 3 (three) times daily as needed for spasms (increased abdominal pain).  . Dulaglutide (TRULICITY) 1.91 YN/8.2NF SOPN Inject 0.75 mg weekly under skin  . glucose blood (BAYER CONTOUR TEST) test strip Use 2x a  day  . Insulin Glargine (LANTUS SOLOSTAR) 100 UNIT/ML Solostar Pen INJECT 35 UNITS INTO THE SKIN AT BEDTIME  . levothyroxine (SYNTHROID, LEVOTHROID) 150 MCG tablet TAKE ONE TABLET BY MOUTH EVERY MORNING  . losartan (COZAAR) 50 MG tablet Take 50 mg by mouth daily.  . metFORMIN (GLUCOPHAGE-XR) 500 MG 24 hr tablet Take 2 tablets daily  . omeprazole  (PRILOSEC) 40 MG capsule Take 1 capsule (40 mg total) by mouth daily.  . ondansetron (ZOFRAN ODT) 4 MG disintegrating tablet Take 1 tablet (4 mg total) by mouth 2 (two) times daily.  . pravastatin (PRAVACHOL) 20 MG tablet TAKE ONE TABLET BY MOUTH EVERY DAY  . SURE COMFORT PEN NEEDLES 32G X 4 MM MISC USE TO INJECT LANTUS ONCE DAILY  . TRULICITY 1.5 SA/6.3KZ SOPN INJECT 1.5MLS INTO THE SKIN WEEKLY   No facility-administered encounter medications on file as of 10/20/2017.     Allergies as of 10/20/2017 - Review Complete 10/20/2017  Allergen Reaction Noted  . Doxycycline Other (See Comments) 06/06/2015  . Nitrofurantoin monohyd macro Other (See Comments) 02/19/2011  . Septra [sulfamethoxazole-trimethoprim] Other (See Comments) 06/06/2015  . Lamictal [lamotrigine] Rash 09/15/2015    Past Medical History:  Diagnosis Date  . CIN I (cervical intraepithelial neoplasia I)   . COPD (chronic obstructive pulmonary disease) ([place])   . GERD (gastroesophageal reflux disease)   . Hemochromatosis   . Hepatic steatosis   . Hypertension   . Hypothyroidism   . Labial cyst    Inclusion cyst  . Liver cirrhosis ([place])   . NASH (nonalcoholic steatohepatitis)   . Positive ANA (antinuclear antibody)   . Seizures ([place])    last seizure 3/17? over dose wellbutrin  . Thyroid disease    Graves dis-Radioactive Iodine    Past Surgical History:  Procedure Laterality Date  . CERVICAL BIOPSY  W/ LOOP ELECTRODE EXCISION     Excision labial inclusion cyst  . CESAREAN SECTION    . COLPOSCOPY    . HAND SURGERY    . SHOULDER ARTHROSCOPY WITH OPEN ROTATOR CUFF REPAIR AND DISTAL CLAVICLE ACROMINECTOMY Left 06/13/2015   Procedure: LEFT SHOULDER ARTHROSCOPY, DEBRIDEMENT, OPEN ROTATOR CUFF REPAIR, CORACOID FRACTURE FIXATION, BICEPS TENODESIS;  Surgeon: Meredith Pel, MD;  Location: [facility];  Service: Orthopedics;  Laterality: Left;  . SHOULDER CLOSED REDUCTION Left 09/26/2015   Procedure: CLOSED MANIPULATION SHOULDER  UNDER ANESTHESIA;  Surgeon: Meredith Pel, MD;  Location: [facility];  Service: Orthopedics;  Laterality: Left;  . TUBAL LIGATION      Family History  Problem Relation Age of Onset  . Hypertension Mother   . Heart disease Mother   . Diabetes Father   . Heart disease Father   . Heart disease Brother   . Breast cancer Maternal Grandmother   . Seizures Neg Hx   . Stomach cancer Neg Hx   . Colon cancer Neg Hx     Social History   Socioeconomic History  . Marital status: Married    Spouse name: Tommie Raymond  . Number of children: 1  . Years of education: 59  . Highest education level: Not on file  Occupational History  . Not on file  Social Needs  . Financial resource strain: Not on file  . Food insecurity:    Worry: Not on file    Inability: Not on file  . Transportation needs:    Medical: Not on file    Non-medical: Not on file  Tobacco Use  . Smoking status: Former Smoker  Packs/day: 1.00    Types: Cigarettes    Last attempt to quit: 02/14/2015    Years since quitting: 2.6  . Smokeless tobacco: Never Used  Substance and Sexual Activity  . Alcohol use: Yes    Alcohol/week: 0.0 standard drinks    Comment: rare  . Drug use: No    Comment: Hx of marijuana use  . Sexual activity: Yes    Birth control/protection: Surgical  Lifestyle  . Physical activity:    Days per week: Not on file    Minutes per session: Not on file  . Stress: Not on file  Relationships  . Social connections:    Talks on phone: Not on file    Gets together: Not on file    Attends religious service: Not on file    Active member of club or organization: Not on file    Attends meetings of clubs or organizations: Not on file    Relationship status: Not on file  . Intimate partner violence:    Fear of current or ex partner: Not on file    Emotionally abused: Not on file    Physically abused: Not on file    Forced sexual activity: Not on file  Other Topics Concern  . Not on file  Social History  Narrative   Lives with husband and sister   Caffeine use: Tea/coffee daily   Review of systems: Review of Systems  Constitutional: Negative for fever and chills.  HENT: Negative.   Eyes: Negative for blurred vision.  Respiratory: as per HPI  Cardiovascular: Negative for chest pain and palpitations.  Gastrointestinal: Negative for vomiting, diarrhea, blood per rectum. Genitourinary: Negative for dysuria, urgency, frequency and hematuria.  Musculoskeletal: Negative for myalgias, back pain and joint pain.  Skin: Negative for itching and rash.  Neurological: Negative for dizziness, tremors, focal weakness, seizures and loss of consciousness.  Endo/Heme/Allergies: Negative for environmental allergies.  Psychiatric/Behavioral: Negative for depression, suicidal ideas and hallucinations.  All other systems reviewed and are negative.   Marshell Garfinkel MD [facility] 10/20/2017, 10:17 AM  CC: Shirline Frees, MD

## 2017-11-10 ENCOUNTER — Other Ambulatory Visit: Payer: Self-pay | Admitting: Internal Medicine

## 2017-11-13 ENCOUNTER — Telehealth: Payer: Self-pay | Admitting: Internal Medicine

## 2017-11-13 NOTE — Telephone Encounter (Signed)
Patient wants to know the status of ultrasound appt

## 2017-11-13 NOTE — Telephone Encounter (Signed)
Spoke with pt and let her know she is not due for [place] until November. She knows if she has not heard anything about scheduling it by mid November to contact the office.

## 2017-11-24 ENCOUNTER — Other Ambulatory Visit: Payer: Self-pay | Admitting: Internal Medicine

## 2017-12-08 ENCOUNTER — Telehealth: Payer: Self-pay | Admitting: *Deleted

## 2017-12-08 ENCOUNTER — Other Ambulatory Visit: Payer: Self-pay | Admitting: Internal Medicine

## 2017-12-08 DIAGNOSIS — K7581 Nonalcoholic steatohepatitis (NASH): Secondary | ICD-10-CM

## 2017-12-08 NOTE — Telephone Encounter (Signed)
Patient has been scheduled for abdominal ultrasound at [facility] on Friday, November 15 at 9:00 am. NPO 6 hour prior. I have left a voicemail for patient to call back so I can advise her of this information.

## 2017-12-08 NOTE — Telephone Encounter (Signed)
-----   Message from Larina Bras, [place] sent at 08/18/2017  9:43 AM EDT ----- Pt needs Center screening u/s for cirrhosis in Nov 2019. See 08/18/17 office note

## 2017-12-08 NOTE — Telephone Encounter (Signed)
I have spoken to patient to advise of time, date, location and prep for upcoming ultrasound. She verbalizes understanding.

## 2017-12-08 NOTE — Telephone Encounter (Signed)
Pt is returning your call [phone]

## 2017-12-12 ENCOUNTER — Encounter: Payer: Self-pay | Admitting: Internal Medicine

## 2017-12-12 ENCOUNTER — Ambulatory Visit (INDEPENDENT_AMBULATORY_CARE_PROVIDER_SITE_OTHER): Payer: BLUE CROSS/BLUE SHIELD | Admitting: Internal Medicine

## 2017-12-12 VITALS — BP 122/80 | HR 90 | Ht 62.0 in | Wt 164.0 lb

## 2017-12-12 DIAGNOSIS — E785 Hyperlipidemia, unspecified: Secondary | ICD-10-CM

## 2017-12-12 DIAGNOSIS — E039 Hypothyroidism, unspecified: Secondary | ICD-10-CM | POA: Diagnosis not present

## 2017-12-12 DIAGNOSIS — E1165 Type 2 diabetes mellitus with hyperglycemia: Secondary | ICD-10-CM | POA: Diagnosis not present

## 2017-12-12 LAB — POCT GLYCOSYLATED HEMOGLOBIN (HGB A1C): Hemoglobin A1C: 7.6 % — AB (ref 4.0–5.6)

## 2017-12-12 MED ORDER — DULAGLUTIDE 1.5 MG/0.5ML ~~LOC~~ SOAJ: SUBCUTANEOUS | 11 refills | Status: DC

## 2017-12-12 MED ORDER — METFORMIN HCL ER 500 MG PO TB24: 1000.0000 mg | ORAL_TABLET | Freq: Every day | ORAL | 3 refills | Status: DC

## 2017-12-12 MED ORDER — LEVOTHYROXINE SODIUM 150 MCG PO TABS: 150.0000 ug | ORAL_TABLET | Freq: Every morning | ORAL | 3 refills | Status: DC

## 2017-12-12 MED ORDER — PRAVASTATIN SODIUM 20 MG PO TABS: 20.0000 mg | ORAL_TABLET | Freq: Every day | ORAL | 3 refills | Status: DC

## 2017-12-12 MED ORDER — INSULIN GLARGINE 100 UNIT/ML SOLOSTAR PEN: PEN_INJECTOR | SUBCUTANEOUS | 3 refills | Status: DC

## 2017-12-12 NOTE — Patient Instructions (Addendum)
Please increase: - Lantus 38 units at bedtime (can increase to 40 units if still needed in 1 week)  Continue: - Metformin ER 500 mg 2x a day - Trulicity 1.5 mg weekly  Please continue levothyroxine 150 mcg daily.  Take the thyroid hormone every day, with water, at least 30 minutes before breakfast, separated by at least 4 hours from: - acid reflux medications - calcium - iron - multivitamins  Please return in 3 months with your sugar log.

## 2017-12-12 NOTE — Progress Notes (Signed)
Patient ID: Emma Glenn, female   DOB: April 06, 1965, 52 y.o.   MRN: [id number]   HPI: Emma Glenn is a 52 y.o.-year-old female, initially referred by her GI Dr, Dr. Hilarie Fredrickson, for evaluation and tx of high urinary cortisol. She also had very uncontrolled DM2, insulin dependent and hypothyroidism. Last visit 3 months ago.  DM2: Reviewed HbA1c levels: Lab Results  Component Value Date   HGBA1C 7.4 (A) 08/27/2017   HGBA1C 7.7 05/21/2017   HGBA1C 9.0 02/18/2017  Labs from [facility], drawn on 07/29/2016: HbA1c still very high, at 12.6%  Pt is on a regimen of: - Lantus 35 units at bedtime - Metformin ER 500 mg 2x a day -she tolerates this only with dicyclomine for diarrhea - Trulicity 1.5 mg weekly  She checks her sugars once a day per review of the log: - am: 122-130 >> 153-270 (milk shakes) >> 136-188 - 2h after b'fast: n/c - before lunch: n/c >> 175 - 2h after lunch: n/c - before dinner:140-170 (snack) >> 90-186, 246 >> 90-165, 187 - 2h after dinner: n/c >> 170 >> 157-199 - bedtime: n/c >> 168 - nighttime: n/c Lowest sugar was 90 >> 90; it is unclear at which level she has hypoglycemia awareness. Highest sugar was 270 >> 285  [name] meals are: - Breakfast: Cereals or 2 eggs and toast - Lunch: Skips - Dinner: Meat and veggies - Snacks: Fruit   Meter: Bayer Contour  - no CKD, last BUN/creatinine:  Lab Results  Component Value Date   BUN 14 08/18/2017   BUN 12 12/10/2016   CREATININE 0.71 08/18/2017   CREATININE 0.64 12/10/2016  07/29/2016: UACR was high, 148.9   - + HL; last set of lipids: Lab Results  Component Value Date   CHOL 124 02/18/2017   HDL 17.50 (L) 02/18/2017   LDLCALC 79 02/18/2017   TRIG 137.0 02/18/2017   CHOLHDL 7 02/18/2017   We started pravastatin 12/2016, after we consulted with her gastroenterologist, Dr. Hilarie Fredrickson.  LFTs remained stable after starting the statin but they were higher this summer. She is scheduled for an RUQ U/S in 2 weeks. She has abd.  pain and nausea.  Latest TFTs: Lab Results  Component Value Date   ALT 100 (H) 08/18/2017   AST 122 (H) 08/18/2017   ALKPHOS 52 08/18/2017   BILITOT 0.6 08/18/2017   Previously: Component     Latest Ref Rng & Units 09/23/2016 12/10/2016 01/13/2017  Total Bilirubin     0.2 - 1.2 mg/dL 0.6 0.7 0.6  Alkaline Phosphatase     39 - 117 U/L 74 71 63  AST     0 - 37 U/L 80 (H) 104 (H) 96 (H)  ALT     0 - 35 U/L 64 (H) 70 (H) 73 (H)  Total Protein     6.0 - 8.3 g/dL 7.9 8.5 (H) 8.8 (H)  Albumin     3.5 - 5.2 g/dL 4.5 4.3 4.5  Calcium     8.4 - 10.5 mg/dL  10.2   GFR     >60.00 mL/min  103.22   Bilirubin, Direct     0.0 - 0.3 mg/dL 0.3  0.1   - last eye exam: 08/2017: No DR - no numbness and tingling in her feet.  Hypothyroidism:  Pt is on levothyroxine 150 mcg daily, taken: - in am - fasting - at least 30 min from b'fast - no Ca, Fe, MVI - + PPIs at bedtime - not on Biotin  Latest TFTs were normal: Lab Results  Component Value Date   TSH 2.65 05/21/2017   FREET4 1.36 05/21/2017  07/29/2016: TSH was high: 6.18.   Reviewed previous history: She started to have severe generalized mm cramps in 03/2016.   Around that time >> RUQ pain >> HIDA scan >> GB w/o clear stones, but with sludge. She had an abd. CT scan: increased size of her liver + liver steatosis.  She was also found to have a 24-hour urine free cortisol, which returned slightly elevated: Component     Latest Ref Rng & Units 06/04/2016 06/14/2016  Cortisol (Ur), Free     4.0 - 50.0 mcg/24 h  57.0 (H)  Results received     0.63 - 2.50 g/24 h  1.27  Cortisol, Plasma     Ug/dL (collected at 5 pm) 6.8    She is not on OCPs.  A CT abdomen (05/2016) showed normal adrenals.  A brain MRI (04/2015) showed normal pituitary gland.  At last visit, we checked a dexamethasone suppression test: normal >> no signs of Cushing syndrome:  Component     Latest Ref Rng & Units 07/02/2016  Cortisol - AM     mcg/dL 1.8  (L)  Dexamethasone, Serum     ng/dL 385   She had a liver Bx  >> has level 3 scarring. 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  ROS: Constitutional: no weight gain/no weight loss, + fatigue, no subjective hyperthermia, no subjective hypothermia Eyes: no blurry vision, no xerophthalmia ENT: no sore throat, no nodules palpated in neck, no dysphagia, no odynophagia, no hoarseness Cardiovascular: no CP/no SOB/no palpitations/no leg swelling Respiratory: no cough/no SOB/no wheezing Gastrointestinal: + N/no V/+ D/no C/no acid reflux Musculoskeletal: no muscle aches/no joint aches Skin: no rashes, no hair loss Neurological: no tremors/no numbness/no tingling/no dizziness  I reviewed pt's medications, allergies, PMH, social hx, family hx, and changes were documented in the history of present illness. Otherwise, unchanged from my initial visit note.  Past Medical History:  Diagnosis Date  . CIN I (cervical intraepithelial neoplasia I)   . COPD (chronic obstructive pulmonary disease) ([place])   . GERD (gastroesophageal reflux disease)   . Hemochromatosis   . Hepatic steatosis   . Hypertension   . Hypothyroidism   . Labial cyst    Inclusion cyst  . Liver cirrhosis ([place])   . NASH (nonalcoholic steatohepatitis)   . Positive ANA (antinuclear antibody)   . Seizures ([place])    last seizure 3/17? over dose wellbutrin  . Thyroid disease    Graves dis-Radioactive Iodine   Past Surgical History:  Procedure Laterality Date  . CERVICAL BIOPSY  W/ LOOP ELECTRODE EXCISION     Excision labial inclusion cyst  . CESAREAN SECTION    . COLPOSCOPY    . HAND SURGERY    . SHOULDER ARTHROSCOPY WITH OPEN ROTATOR CUFF REPAIR AND DISTAL CLAVICLE ACROMINECTOMY Left 06/13/2015   Procedure: LEFT SHOULDER ARTHROSCOPY, DEBRIDEMENT, OPEN ROTATOR CUFF REPAIR, CORACOID FRACTURE FIXATION, BICEPS TENODESIS;  Surgeon: Meredith Pel, MD;  Location: [facility];  Service: Orthopedics;   Laterality: Left;  . SHOULDER CLOSED REDUCTION Left 09/26/2015   Procedure: CLOSED MANIPULATION SHOULDER UNDER ANESTHESIA;  Surgeon: Meredith Pel, MD;  Location: [facility];  Service: Orthopedics;  Laterality: Left;  . TUBAL LIGATION     Social History   Social History  . Marital status: Married    Spouse name: Tommie Raymond  . Number of  children: 1  . Years of education: 69   Occupational History  . Material handler    Social History Main Topics  . Smoking status: Former Smoker    Packs/day: 1.00    Types: Cigarettes    Quit date: 02/14/2015  . Smokeless tobacco: Never Used  . Alcohol use 0.0 oz/week     Comment: rare  . Drug use: No     Comment: Hx of marijuana use  . Sexual activity: Yes    Birth control/ protection: Surgical   Social History Narrative   Lives with husband and sister   Caffeine use: Tea/coffee daily      Current Outpatient Medications on File Prior to Visit  Medication Sig Dispense Refill  . BEVESPI AEROSPHERE 9-4.8 MCG/ACT AERO INHALE 2 PUFFS INTO THE LUNGS TWICE DAILY. REPLACES ANORO INHALER. 10.7 g 6  . dicyclomine (BENTYL) 20 MG tablet Take 1 tablet (20 mg total) by mouth 3 (three) times daily as needed for spasms (increased abdominal pain). 90 tablet 2  . Dulaglutide (TRULICITY) 2.99 BZ/1.6RC SOPN Inject 0.75 mg weekly under skin 4 pen 1  . glucose blood (BAYER CONTOUR TEST) test strip Use 2x a day 200 each 3  . Insulin Glargine (LANTUS SOLOSTAR) 100 UNIT/ML Solostar Pen INJECT 35 UNITS INTO THE SKIN AT BEDTIME 30 mL 3  . levothyroxine (SYNTHROID, LEVOTHROID) 150 MCG tablet TAKE ONE TABLET BY MOUTH EVERY MORNING 30 tablet 0  . losartan (COZAAR) 50 MG tablet Take 50 mg by mouth daily.    . metFORMIN (GLUCOPHAGE-XR) 500 MG 24 hr tablet TAKE TWO TABLETS BY MOUTH DAILY 60 tablet 0  . omeprazole (PRILOSEC) 40 MG capsule Take 1 capsule (40 mg total) by mouth daily. 30 capsule 6  . ondansetron (ZOFRAN ODT) 4 MG disintegrating tablet Take 1 tablet (4 mg total) by  mouth 2 (two) times daily. 60 tablet 3  . pravastatin (PRAVACHOL) 20 MG tablet TAKE ONE TABLET BY MOUTH DAILY 30 tablet 2  . SURE COMFORT PEN NEEDLES 32G X 4 MM MISC USE TO INJECT LANTUS ONCE DAILY 100 each 3  . TRULICITY 1.5 VE/9.3YB SOPN INJECT 1.5MLS INTO THE SKIN WEEKLY 2 mL 1   No current facility-administered medications on file prior to visit.    Allergies  Allergen Reactions  . Doxycycline Other (See Comments)    Flu like symptoms (high fever, chills)  . Nitrofurantoin Monohyd Macro Other (See Comments)    Flu like symptoms (high fever, chills)  . Septra [Sulfamethoxazole-Trimethoprim] Other (See Comments)    Flu like symptoms (high fever, chills)  . Lamictal [Lamotrigine] Rash   Family History  Problem Relation Age of Onset  . Hypertension Mother   . Heart disease Mother   . Diabetes Father   . Heart disease Father   . Heart disease Brother   . Breast cancer Maternal Grandmother   . Seizures Neg Hx   . Stomach cancer Neg Hx   . Colon cancer Neg Hx    PE: BP 122/80   Pulse 90   Ht 5' 2"  (1.575 m) Comment: measured  Wt 164 lb (74.4 kg)   LMP 06/23/2016   SpO2 96%   BMI 30.00 kg/m  Wt Readings from Last 3 Encounters:  12/12/17 164 lb (74.4 kg)  10/20/17 164 lb 9.6 oz (74.7 kg)  08/27/17 162 lb 9.6 oz (73.8 kg)   Constitutional: overweight, in NAD Eyes: PERRLA, EOMI, no exophthalmos ENT: moist mucous membranes, no thyromegaly, no cervical lymphadenopathy Cardiovascular: RRR, No MRG  Respiratory: CTA B Gastrointestinal: abdomen soft, NT, ND, BS+ Musculoskeletal: no deformities, strength intact in all 4 Skin: moist, warm, no rashes Neurological: no tremor with outstretched hands, DTR normal in all 4  ASSESSMENT: 1. DM2   2. Hypothyroidism   3. HL  PLAN: 1. DM2 -Patient with longstanding, uncontrolled, type 2 diabetes, on metformin, weekly GLP-1 receptor agonist and Lantus.  At last visit, HbA1c improved to 7.4% but she was not checking sugars after she  started to improve her diet by stopping milkshakes.  I strongly advised her to stay off these high carb, high fat foods, but we did not change her regimen at that time. - At this visit, sugars are still high, but improved in the last 2 to 3 weeks.  This could be related to the fact that she is feeling poorly, fatigued, with nausea and diarrhea (she does not think these are related to metformin or Trulicity).  Since the sugars are still high especially in the morning, I advised her to increase the Lantus slightly, but we need to be careful as she is usually skipping lunch and, while the sugars are usually at goal before dinner, they may decrease if increase the Lantus.  I advised her to eat something for lunch even if she is not that hungry. - Otherwise, we will continue with the metformin and Trulicity at the current doses - I suggested to:  Patient Instructions  Please increase: - Lantus 38 units at bedtime (can increase to 40 units if still needed in 1 week)  Continue: - Metformin ER 500 mg 2x a day - Trulicity 1.5 mg weekly  Please continue levothyroxine 150 mcg daily.  Take the thyroid hormone every day, with water, at least 30 minutes before breakfast, separated by at least 4 hours from: - acid reflux medications - calcium - iron - multivitamins  Please return in 3 months with your sugar log.    - today, HbA1c is 7.6% (slightly higher) - continue checking sugars at different times of the day - check 1x a day, rotating checks - advised for yearly eye exams >> she is UTD - UTD with flu, PNaA vaccines - Return to clinic in 3 mo with sugar log      2. Hypothyroidism - latest thyroid labs reviewed with pt >> normal 05/2017 - she continues on LT4 150 mcg daily - pt feels good on this dose. - we discussed about taking the thyroid hormone every day, with water, >30 minutes before breakfast, separated by >4 hours from acid reflux medications, calcium, iron, multivitamins. Pt. is taking it  correctly.  3. HL  - Reviewed latest lipid panel from 02/2017: HDL very low, LDL and triglycerides at goal Lab Results  Component Value Date   CHOL 124 02/18/2017   HDL 17.50 (L) 02/18/2017   LDLCALC 79 02/18/2017   TRIG 137.0 02/18/2017   CHOLHDL 7 02/18/2017  - Continues pravastatin without side effects.  Philemon Kingdom, MD PhD [facility]

## 2017-12-26 ENCOUNTER — Ambulatory Visit (HOSPITAL_COMMUNITY)
Admission: RE | Admit: 2017-12-26 | Discharge: 2017-12-26 | Disposition: A | Discharge status: Home / Self Care | Payer: BLUE CROSS/BLUE SHIELD | Source: Ambulatory Visit | Attending: Internal Medicine | Admitting: Internal Medicine

## 2017-12-26 DIAGNOSIS — K7581 Nonalcoholic steatohepatitis (NASH): Secondary | ICD-10-CM | POA: Diagnosis not present

## 2018-04-02 ENCOUNTER — Ambulatory Visit (INDEPENDENT_AMBULATORY_CARE_PROVIDER_SITE_OTHER): Payer: BLUE CROSS/BLUE SHIELD | Admitting: Internal Medicine

## 2018-04-02 ENCOUNTER — Encounter: Payer: Self-pay | Admitting: Internal Medicine

## 2018-04-02 ENCOUNTER — Ambulatory Visit: Payer: BLUE CROSS/BLUE SHIELD | Admitting: Internal Medicine

## 2018-04-02 VITALS — BP 138/88 | HR 89 | Ht 62.0 in | Wt 169.0 lb

## 2018-04-02 DIAGNOSIS — E039 Hypothyroidism, unspecified: Secondary | ICD-10-CM

## 2018-04-02 DIAGNOSIS — E785 Hyperlipidemia, unspecified: Secondary | ICD-10-CM | POA: Diagnosis not present

## 2018-04-02 DIAGNOSIS — E1165 Type 2 diabetes mellitus with hyperglycemia: Secondary | ICD-10-CM | POA: Diagnosis not present

## 2018-04-02 LAB — MICROALBUMIN / CREATININE URINE RATIO
Creatinine,U: 182.4 mg/dL
MICROALB UR: 433.6 mg/dL — AB (ref 0.0–1.9)
Microalb Creat Ratio: 237.7 mg/g — ABNORMAL HIGH (ref 0.0–30.0)

## 2018-04-02 LAB — LIPID PANEL
CHOL/HDL RATIO: 5
CHOLESTEROL: 148 mg/dL (ref 0–200)
HDL: 28.4 mg/dL — AB (ref >39.00)
LDL CALC: 96 mg/dL (ref 0–99)
NonHDL: 119.92
TRIGLYCERIDES: 118 mg/dL (ref 0.0–149.0)
VLDL: 23.6 mg/dL (ref 0.0–40.0)

## 2018-04-02 LAB — POCT GLYCOSYLATED HEMOGLOBIN (HGB A1C): Hemoglobin A1C: 8 % — AB (ref 4.0–5.6)

## 2018-04-02 LAB — TSH: TSH: 4.77 u[IU]/mL — ABNORMAL HIGH (ref 0.35–4.50)

## 2018-04-02 MED ORDER — INSULIN GLARGINE 100 UNIT/ML SOLOSTAR PEN: PEN_INJECTOR | SUBCUTANEOUS | 3 refills | Status: DC

## 2018-04-02 MED ORDER — EMPAGLIFLOZIN 10 MG PO TABS: 10.0000 mg | ORAL_TABLET | Freq: Every day | ORAL | 11 refills | Status: DC

## 2018-04-02 NOTE — Progress Notes (Signed)
Patient ID: Emma Glenn, female   DOB: 01-20-1966, 53 y.o.   MRN: [id number]   HPI: Emma Glenn is a 53 y.o.-year-old female, initially referred by her GI Dr, Dr. Hilarie Fredrickson, for evaluation and tx of high urinary cortisol. She also had very uncontrolled DM2, insulin dependent and hypothyroidism. Last visit 3 months ago.  Since last visit, she relaxed her diet over the holidays and also did not check her sugars at that time.  When she actually started to check approximately a month ago, sugars are higher and they are not decreasing significantly.  DM2: Reviewed HbA1c levels: Lab Results  Component Value Date   HGBA1C 7.6 (A) 12/12/2017   HGBA1C 7.4 (A) 08/27/2017   HGBA1C 7.7 05/20/2017  Labs from [place], drawn on 07/29/2016: HbA1c still very high, at 12.6%  Pt is on a regimen of: - Lantus 35 >> 38 >> 42 units at bedtime - Metformin ER 500 mg 2x a day -she tolerates this only with dicyclomine for diarrhea - Trulicity 1.5 mg weekly  She checks her sugars 0-2x a day per review of her log: - am: 153-270 >> 136-188 >> 180-215 - 2h after b'fast: n/c - before lunch: n/c >> 175 >> 254 - 2h after lunch: n/c >> 160-234 - before dinner: 90-165, 187 >> 234 - 2h after dinner: 170 >> 157-199 >> 164 - bedtime: n/c >> 168 - nighttime: n/c Lowest sugar was 90 >> 90 >> 160; it is unclear at which level she has hypoglycemia awareness. Highest sugar was 270 >> 285 >> 254  [name] meals are: - Breakfast: Cereals or 2 eggs and toast - Lunch: Skips - Dinner: Meat and veggies - Snacks: Fruit  No milk shakes anymore.  Meter: Bayer Contour  -No CKD, last BUN/creatinine:  Lab Results  Component Value Date   BUN 14 08/18/2017   BUN 12 12/10/2016   CREATININE 0.71 08/18/2017   CREATININE 0.64 12/10/2016  07/29/2016: UACR was high, 148.9   -+ HL; last set of lipids: Lab Results  Component Value Date   CHOL 124 02/18/2017   HDL 17.50 (L) 02/18/2017   LDLCALC 79 02/18/2017   TRIG 137.0 02/18/2017    CHOLHDL 7 02/18/2017   Restarted pravastatin 12/2016, after we consulted with her gastroenterologist, Dr. Hilarie Fredrickson.  LFTs remained stable after starting the statin but started to increase afterwards, being higher at last check in 08/2017.  Dr. Hilarie Fredrickson is aware of the above and considers these to be related to her fatty liver disease.  We continued pravastatin afterwards.  Latest LFTs appear higher:: Lab Results  Component Value Date   ALT 100 (H) 08/18/2017   AST 122 (H) 08/18/2017   ALKPHOS 52 08/18/2017   BILITOT 0.6 08/18/2017   - last eye exam: 08/2017: No DR -No numbness and tingling in her feet.  Hypothyroidism:  Pt is on levothyroxine 150 mcg daily, taken: - in am - fasting - at least 30 min from b'fast - no Ca, Fe, MVI - + PPIs at bedtime - not on Biotin  Latest TFTs were normal: Lab Results  Component Value Date   TSH 4.77 (H) 04/02/2018   TSH 2.65 05/21/2017   TSH 17.64 (H) 02/18/2017   TSH 4.45 06/04/2016   TSH 2.338 04/28/2015  07/29/2016: TSH 6.18.  Lab Results  Component Value Date   FREET4 1.36 05/21/2017   FREET4 0.96 02/18/2017   Reviewed previous history: She started to have severe generalized mm cramps in 03/2016.   Around that  time >> RUQ pain >> HIDA scan >> GB w/o clear stones, but with sludge. She had an abd. CT scan: increased size of her liver + liver steatosis.  She was also found to have a 24-hour urine free cortisol, which returned slightly elevated: Component     Latest Ref Rng & Units 06/04/2016 06/14/2016  Cortisol (Ur), Free     4.0 - 50.0 mcg/24 h  57.0 (H)  Results received     0.63 - 2.50 g/24 h  1.27  Cortisol, Plasma     Ug/dL (collected at 5 pm) 6.8    She is not on OCPs.  A CT abdomen (05/2016) showed normal adrenals.  A brain MRI (04/2015) showed normal pituitary gland.  At last visit, we checked a dexamethasone suppression test: normal >> no signs of Cushing syndrome:  Component     Latest Ref Rng & Units 07/02/2016   Cortisol - AM     mcg/dL 1.8 (L)  Dexamethasone, Serum     ng/dL 385   She had a liver Bx  >> has level 3 scarring. 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  ROS: Constitutional: + weight gain/no weight loss, + fatigue, no subjective hyperthermia, no subjective hypothermia Eyes: no blurry vision, no xerophthalmia ENT: no sore throat, no nodules palpated in neck, no dysphagia, no odynophagia, no hoarseness Cardiovascular: no CP/no SOB/no palpitations/no leg swelling Respiratory: no cough/no SOB/no wheezing Gastrointestinal: + N/no V/no D/no C/no acid reflux Musculoskeletal: no muscle aches/no joint aches Skin: no rashes, no hair loss Neurological: no tremors/no numbness/no tingling/no dizziness  I reviewed pt's medications, allergies, PMH, social hx, family hx, and changes were documented in the history of present illness. Otherwise, unchanged from my initial visit note.  Past Medical History:  Diagnosis Date  . CIN I (cervical intraepithelial neoplasia I)   . COPD (chronic obstructive pulmonary disease) ([place])   . GERD (gastroesophageal reflux disease)   . Hemochromatosis   . Hepatic steatosis   . Hypertension   . Hypothyroidism   . Labial cyst    Inclusion cyst  . Liver cirrhosis ([place])   . NASH (nonalcoholic steatohepatitis)   . Positive ANA (antinuclear antibody)   . Seizures ([place])    last seizure 3/17? over dose wellbutrin  . Thyroid disease    Graves dis-Radioactive Iodine   Past Surgical History:  Procedure Laterality Date  . CERVICAL BIOPSY  W/ LOOP ELECTRODE EXCISION     Excision labial inclusion cyst  . CESAREAN SECTION    . COLPOSCOPY    . HAND SURGERY    . SHOULDER ARTHROSCOPY WITH OPEN ROTATOR CUFF REPAIR AND DISTAL CLAVICLE ACROMINECTOMY Left 06/13/2015   Procedure: LEFT SHOULDER ARTHROSCOPY, DEBRIDEMENT, OPEN ROTATOR CUFF REPAIR, CORACOID FRACTURE FIXATION, BICEPS TENODESIS;  Surgeon: Meredith Pel, MD;  Location: [facility];   Service: Orthopedics;  Laterality: Left;  . SHOULDER CLOSED REDUCTION Left 09/26/2015   Procedure: CLOSED MANIPULATION SHOULDER UNDER ANESTHESIA;  Surgeon: Meredith Pel, MD;  Location: [facility];  Service: Orthopedics;  Laterality: Left;  . TUBAL LIGATION     Social History   Social History  . Marital status: Married    Spouse name: Tommie Raymond  . Number of children: 1  . Years of education: 55   Occupational History  . Material handler    Social History Main Topics  . Smoking status: Former Smoker    Packs/day: 1.00    Types: Cigarettes    Quit date: 02/14/2015  .  Smokeless tobacco: Never Used  . Alcohol use 0.0 oz/week     Comment: rare  . Drug use: No     Comment: Hx of marijuana use  . Sexual activity: Yes    Birth control/ protection: Surgical   Social History Narrative   Lives with husband and sister   Caffeine use: Tea/coffee daily      Current Outpatient Medications on File Prior to Visit  Medication Sig Dispense Refill  . BEVESPI AEROSPHERE 9-4.8 MCG/ACT AERO INHALE 2 PUFFS INTO THE LUNGS TWICE DAILY. REPLACES ANORO INHALER. 10.7 g 6  . dicyclomine (BENTYL) 20 MG tablet Take 1 tablet (20 mg total) by mouth 3 (three) times daily as needed for spasms (increased abdominal pain). 90 tablet 2  . Dulaglutide (TRULICITY) 1.5 MG/8.6PY SOPN INJECT 1.5 MLS INTO THE SKIN WEEKLY 4 pen 11  . glucose blood (BAYER CONTOUR TEST) test strip Use 2x a day 200 each 3  . Insulin Glargine (LANTUS SOLOSTAR) 100 UNIT/ML Solostar Pen INJECT 40 UNITS INTO THE SKIN AT BEDTIME 30 mL 3  . levothyroxine (SYNTHROID, LEVOTHROID) 150 MCG tablet Take 1 tablet (150 mcg total) by mouth every morning. 90 tablet 3  . losartan (COZAAR) 50 MG tablet Take 50 mg by mouth daily.    . metFORMIN (GLUCOPHAGE-XR) 500 MG 24 hr tablet Take 2 tablets (1,000 mg total) by mouth daily. 180 tablet 3  . omeprazole (PRILOSEC) 40 MG capsule Take 1 capsule (40 mg total) by mouth daily. 30 capsule 6  . ondansetron (ZOFRAN  ODT) 4 MG disintegrating tablet Take 1 tablet (4 mg total) by mouth 2 (two) times daily. 60 tablet 3  . pravastatin (PRAVACHOL) 20 MG tablet Take 1 tablet (20 mg total) by mouth daily. 90 tablet 3  . SURE COMFORT PEN NEEDLES 32G X 4 MM MISC USE TO INJECT LANTUS ONCE DAILY 100 each 3   No current facility-administered medications on file prior to visit.    Allergies  Allergen Reactions  . Doxycycline Other (See Comments)    Flu like symptoms (high fever, chills)  . Nitrofurantoin Monohyd Macro Other (See Comments)    Flu like symptoms (high fever, chills)  . Septra [Sulfamethoxazole-Trimethoprim] Other (See Comments)    Flu like symptoms (high fever, chills)  . Lamictal [Lamotrigine] Rash   Family History  Problem Relation Age of Onset  . Hypertension Mother   . Heart disease Mother   . Diabetes Father   . Heart disease Father   . Heart disease Brother   . Breast cancer Maternal Grandmother   . Seizures Neg Hx   . Stomach cancer Neg Hx   . Colon cancer Neg Hx    PE: BP 138/88   Pulse 89   Ht 5' 2"  (1.575 m)   Wt 169 lb (76.7 kg)   LMP 06/23/2016   SpO2 97%   BMI 30.91 kg/m  Wt Readings from Last 3 Encounters:  04/02/18 169 lb (76.7 kg)  12/12/17 164 lb (74.4 kg)  10/20/17 164 lb 9.6 oz (74.7 kg)   Constitutional: overweight, in NAD Eyes: PERRLA, EOMI, no exophthalmos ENT: moist mucous membranes, no thyromegaly, no cervical lymphadenopathy Cardiovascular: RRR, No MRG Respiratory: CTA B Gastrointestinal: abdomen soft, NT, ND, BS+ Musculoskeletal: no deformities, strength intact in all 4 Skin: moist, warm, no rashes Neurological: no tremor with outstretched hands, DTR normal in all 4  ASSESSMENT: 1. DM2   2. Hypothyroidism  -Post ablative, after RAI treatment for Graves' disease  3. HL  PLAN:  1. DM2 -Patient with longstanding, uncontrolled, type 2 diabetes, on metformin, weekly GLP-1 receptor agonist and long-acting insulin.  At last visit, sugars were high  but improved in the 2 to 3 weeks before our appointment.  At that time, she was feeling poorly, fatigued, with nausea and diarrhea and she did not think his symptoms were related to metformin or Trulicity.  Since sugars are still high especially in the morning we increase the Lantus dose slightly, but I advised her to be careful with this as she is usually skipping lunch and while the sugars are usually at goal before dinner, they may decrease on higher insulin doses.  We discussed about trying to eat something for lunch even if she was not that hungry.  At that time, HbA1c was slightly higher, at 7.6%. -At this visit, sugars are higher, after the holidays.  She relaxed her diet then and did not get back to her previous diet as her husband is now home and she eats more than before along with him.  She would like to go back to the previous diet, but in the meantime, since her sugars are high throughout the day, we decided to add an SGLT 2 inhibitor.  I would use a low dose and advised her to stay hydrated.  We discussed about benefits and possible side effects.  I gave her a coupon card for Jardiance. - I suggested to:  Patient Instructions  Please continue: - Lantus 38 units at bedtime - Metformin ER 500 mg 2x a day - Trulicity 1.5 mg weekly  Please add: - Jardiance 10 mg before b'fast  Please continue levothyroxine 150 mcg daily.  Take the thyroid hormone every day, with water, at least 30 minutes before breakfast, separated by at least 4 hours from: - acid reflux medications - calcium - iron - multivitamins  Please return in 3 months with your sugar log.    - today, HbA1c is 8% (higher) - continue checking sugars at different times of the day - check 1x a day, rotating checks - advised for yearly eye exams >> she is UTD - We will check annual labs at this visit - Return to clinic in 3 mo with sugar log      2. Hypothyroidism - Post ablative - latest thyroid labs reviewed with pt >>  normal 05/2017 - she continues on LT4 150 mcg daily - pt feels good on this dose. - we discussed about taking the thyroid hormone every day, with water, >30 minutes before breakfast, separated by >4 hours from acid reflux medications, calcium, iron, multivitamins. Pt. is taking it correctly. - will check TSH today  3. HL  - Reviewed latest lipid panel from 02/2017: LDL very low, the rest of the fractions improved Lab Results  Component Value Date   CHOL 124 02/18/2017   HDL 17.50 (L) 02/18/2017   LDLCALC 79 02/18/2017   TRIG 137.0 02/18/2017   CHOLHDL 7 02/18/2017  - Continues pravastatin without side effects. - will check Lipids - last meal >4h ago  Office Visit on 04/02/2018  Component Date Value Ref Range Status  . TSH 04/02/2018 4.77* 0.35 - 4.50 uIU/mL Final  . Glucose, Bld 04/02/2018 187* 65 - 99 mg/dL Final   Comment: .            Fasting reference interval . For someone without known diabetes, a glucose value >125 mg/dL indicates that they may have diabetes and this should be confirmed with a follow-up test. .   Marland Kitchen  BUN 04/02/2018 11  7 - 25 mg/dL Final  . Creat 04/02/2018 0.72  0.50 - 1.05 mg/dL Final   Comment: For patients >46 years of age, the reference limit for Creatinine is approximately 13% higher for people identified as African-American. .   . GFR, Est Non African American 04/02/2018 95  > OR = 60 mL/min/1.88m Final  . GFR, Est African American 04/02/2018 110  > OR = 60 mL/min/1.758mFinal  . BUN/Creatinine Ratio 0238/25/0539OT APPLICABLE  6 - 22 (calc) Final  . Sodium 04/02/2018 139  135 - 146 mmol/L Final  . Potassium 04/02/2018 4.5  3.5 - 5.3 mmol/L Final  . Chloride 04/02/2018 101  98 - 110 mmol/L Final  . CO2 04/02/2018 27  20 - 32 mmol/L Final  . Calcium 04/02/2018 9.9  8.6 - 10.4 mg/dL Final  . Total Protein 04/02/2018 8.0  6.1 - 8.1 g/dL Final  . Albumin 04/02/2018 4.2  3.6 - 5.1 g/dL Final  . Globulin 04/02/2018 3.8* 1.9 - 3.7 g/dL (calc)  Final  . AG Ratio 04/02/2018 1.1  1.0 - 2.5 (calc) Final  . Total Bilirubin 04/02/2018 0.7  0.2 - 1.2 mg/dL Final  . Alkaline phosphatase (APISO) 04/02/2018 69  37 - 153 U/L Final  . AST 04/02/2018 142* 10 - 35 U/L Final  . ALT 04/02/2018 101* 6 - 29 U/L Final  . Microalb, Ur 04/02/2018 433.6* 0.0 - 1.9 mg/dL Final  . Creatinine,U 04/02/2018 182.4  mg/dL Final  . Microalb Creat Ratio 04/02/2018 237.7* 0.0 - 30.0 mg/g Final  . Cholesterol 04/02/2018 148  0 - 200 mg/dL Final   ATP III Classification       Desirable:  < 200 mg/dL               Borderline High:  200 - 239 mg/dL          High:  > = 240 mg/dL  . Triglycerides 04/02/2018 118.0  0.0 - 149.0 mg/dL Final   Normal:  <150 mg/dLBorderline High:  150 - 199 mg/dL  . HDL 04/02/2018 28.40* >39.00 mg/dL Final  . VLDL 04/02/2018 23.6  0.0 - 40.0 mg/dL Final  . LDL Cholesterol 04/02/2018 96  0 - 99 mg/dL Final  . Total CHOL/HDL Ratio 04/02/2018 5   Final                  Men          Women1/2 Average Risk     3.4          3.3Average Risk          5.0          4.42X Average Risk          9.6          7.13X Average Risk          15.0          11.0                      . NonHDL 04/02/2018 119.92   Final   NOTE:  Non-HDL goal should be 30 mg/dL higher than patient's LDL goal (i.e. LDL goal of < 70 mg/dL, would have non-HDL goal of < 100 mg/dL)  . Hemoglobin A1C 04/02/2018 8.0* 4.0 - 5.6 % Final   TSH is slightly high, but due to previous variability in her TSH levels, I would like to continue the current dose of levothyroxine and recheck  her test at next visit. Also, her ACR has increased.  We will also repeat this at next visit. LDL is worse, but HDL also increased, with the rest of the fractions being at goal.  We will continue the current dose of pravastatin. LFTs are slightly higher.  I will forward them to Dr. Hilarie Fredrickson.  Philemon Kingdom, MD PhD [facility] Endocrin[facility]

## 2018-04-02 NOTE — Patient Instructions (Addendum)
Please continue: - Lantus 38 units at bedtime - Metformin ER 500 mg 2x a day - Trulicity 1.5 mg weekly  Please add: - Jardiance 10 mg before b'fast  Please continue levothyroxine 150 mcg daily.  Take the thyroid hormone every day, with water, at least 30 minutes before breakfast, separated by at least 4 hours from: - acid reflux medications - calcium - iron - multivitamins  Please return in 3 months with your sugar log.

## 2018-04-03 LAB — COMPLETE METABOLIC PANEL WITH GFR
AG Ratio: 1.1 (calc) (ref 1.0–2.5)
ALT: 101 U/L — AB (ref 6–29)
AST: 142 U/L — AB (ref 10–35)
Albumin: 4.2 g/dL (ref 3.6–5.1)
Alkaline phosphatase (APISO): 69 U/L (ref 37–153)
BUN: 11 mg/dL (ref 7–25)
CO2: 27 mmol/L (ref 20–32)
CREATININE: 0.72 mg/dL (ref 0.50–1.05)
Calcium: 9.9 mg/dL (ref 8.6–10.4)
Chloride: 101 mmol/L (ref 98–110)
GFR, EST AFRICAN AMERICAN: 110 mL/min/{1.73_m2} (ref >=60)
GFR, Est Non African American: 95 mL/min/{1.73_m2} (ref >=60)
Globulin: 3.8 g/dL (calc) — ABNORMAL HIGH (ref 1.9–3.7)
Glucose, Bld: 187 mg/dL — ABNORMAL HIGH (ref 65–99)
Potassium: 4.5 mmol/L (ref 3.5–5.3)
Sodium: 139 mmol/L (ref 135–146)
TOTAL PROTEIN: 8 g/dL (ref 6.1–8.1)
Total Bilirubin: 0.7 mg/dL (ref 0.2–1.2)

## 2018-07-15 ENCOUNTER — Other Ambulatory Visit: Payer: Self-pay

## 2018-07-17 ENCOUNTER — Other Ambulatory Visit: Payer: Self-pay

## 2018-07-17 ENCOUNTER — Ambulatory Visit (INDEPENDENT_AMBULATORY_CARE_PROVIDER_SITE_OTHER): Payer: BLUE CROSS/BLUE SHIELD | Admitting: Internal Medicine

## 2018-07-17 ENCOUNTER — Encounter: Payer: Self-pay | Admitting: Internal Medicine

## 2018-07-17 VITALS — BP 120/60 | HR 88 | Ht 62.0 in | Wt 159.0 lb

## 2018-07-17 DIAGNOSIS — E1165 Type 2 diabetes mellitus with hyperglycemia: Secondary | ICD-10-CM

## 2018-07-17 DIAGNOSIS — R7401 Elevation of levels of liver transaminase levels: Secondary | ICD-10-CM

## 2018-07-17 DIAGNOSIS — E039 Hypothyroidism, unspecified: Secondary | ICD-10-CM

## 2018-07-17 DIAGNOSIS — R740 Nonspecific elevation of levels of transaminase and lactic acid dehydrogenase [LDH]: Secondary | ICD-10-CM

## 2018-07-17 DIAGNOSIS — E785 Hyperlipidemia, unspecified: Secondary | ICD-10-CM | POA: Diagnosis not present

## 2018-07-17 LAB — TSH: TSH: 0.12 u[IU]/mL — ABNORMAL LOW (ref 0.35–4.50)

## 2018-07-17 LAB — HEPATIC FUNCTION PANEL
ALT: 50 U/L — ABNORMAL HIGH (ref 0–35)
AST: 55 U/L — ABNORMAL HIGH (ref 0–37)
Albumin: 4.3 g/dL (ref 3.5–5.2)
Alkaline Phosphatase: 56 U/L (ref 39–117)
Bilirubin, Direct: 0.2 mg/dL (ref 0.0–0.3)
Total Bilirubin: 0.6 mg/dL (ref 0.2–1.2)
Total Protein: 8.4 g/dL — ABNORMAL HIGH (ref 6.0–8.3)

## 2018-07-17 LAB — POCT GLYCOSYLATED HEMOGLOBIN (HGB A1C): Hemoglobin A1C: 6.5 % — AB (ref 4.0–5.6)

## 2018-07-17 LAB — T4, FREE: Free T4: 1.6 ng/dL (ref 0.60–1.60)

## 2018-07-17 LAB — MICROALBUMIN / CREATININE URINE RATIO
Creatinine,U: 146.5 mg/dL
Microalb Creat Ratio: 20.2 mg/g (ref 0.0–30.0)
Microalb, Ur: 29.5 mg/dL — ABNORMAL HIGH (ref 0.0–1.9)

## 2018-07-17 NOTE — Patient Instructions (Addendum)
Please continue: - Lantus 42 units at bedtime - Metformin ER 500 mg 2x a day - Trulicity 1.5 mg weekly - Jardiance 10 mg before breakfast  If your insurance does not cover Trulicity /Jardiance, please ask them if they cover the following: - Trulicity (1x a week) - Ozempic (1x a week) - Bydureon (1x a week) - Victoza (1x a day) - Byetta (2x a day)  - [place]  If they do not cover the above, then ask them which long and short acting insulin they cover.  Please continue levothyroxine 150 mcg daily.  Take the thyroid hormone every day, with water, at least 30 minutes before breakfast, separated by at least 4 hours from: - acid reflux medications - calcium - iron - multivitamins  Please stop at the lab.  Please return in 3 months with your sugar log.

## 2018-07-17 NOTE — Progress Notes (Signed)
Patient ID: Emma Glenn, female   DOB: 1965-05-21, 53 y.o.   MRN: [id number]   HPI: Emma Glenn is a 53 y.o.-year-old female, initially referred by her GI Dr, Dr. Hilarie Fredrickson, returning for follow-up for uncontrolled DM2, insulin dependent, and hypothyroidism. Last visit 3 months ago. She has disability hearing next week. She had [employer] >> ran out 1 week ago. She is not having insurance now!  DM2: Reviewed HbA1c levels: Lab Results  Component Value Date   HGBA1C 8.0 (A) 04/02/2018   HGBA1C 7.6 (A) 12/12/2017   HGBA1C 7.4 (A) 08/27/2017  Labs from [place], drawn on 07/29/2016: HbA1c still very high, at 12.6%  Pt is on a regimen of: - Lantus 35 >> 38 >> 42  units at bedtime - Metformin ER 500 mg 2x a day -she tolerates this only with dicyclomine for diarrhea - Trulicity 1.5 mg weekly, unclear if she can continue - Jardiance 10 mg before breakfast, adedd 03/2018, unclear if she can continue  She checks her sugars 0-2x a day per review of her log - am: 153-270 >> 136-188 >> 180-215 >> 112-156 - 2h after b'fast: n/c >> 89-188 - before lunch: n/c >> 175 >> 254 >> n/c - 2h after lunch: n/c >> 160-234 >> 100-184 - before dinner: 90-165, 187 >> 234 >> 139, 202 - 2h after dinner: 170 >> 157-199 >> 164 >> n/c - bedtime: n/c >> 168 >> 102, 133 - nighttime: n/c Lowest sugar was 160 >> 89; it is unclear at which level she has hypoglycemia awareness. Highest sugar was 254 >> 202.  [name] meals are: - Breakfast: Cereals or 2 eggs and toast - Lunch: Skips - Dinner: Meat and veggies - Snacks: Fruit  Stopped milkshakes. Occasional sodas.  Meter: Bayer Contour  -No CKD, last BUN/creatinine:  Lab Results  Component Value Date   BUN 11 04/02/2018   BUN 14 08/18/2017   CREATININE 0.72 04/02/2018   CREATININE 0.71 08/18/2017    -+ HL; last set of lipids: Lab Results  Component Value Date   CHOL 148 04/02/2018   HDL 28.40 (L) 04/02/2018   LDLCALC 96 04/02/2018   TRIG 118.0  04/02/2018   CHOLHDL 5 04/02/2018   Restarted pravastatin 12/2016, after we consulted with her gastroenterologist, Dr. Hilarie Fredrickson.  LFTs remained stable after starting the statin but started to increase afterwards, being higher at last check in 08/2017.  Dr. Hilarie Fredrickson is aware of the above and considers these to be related to her fatty liver disease.   We continued pravastatin.  Latest LFTs were higher: Lab Results  Component Value Date   ALT 101 (H) 04/02/2018   AST 142 (H) 04/02/2018   ALKPHOS 52 08/18/2017   BILITOT 0.7 04/02/2018   ACR high at last check: Lab Results  Component Value Date   MICRALBCREAT 237.7 (H) 04/02/2018   MICRALBCREAT 16.1 02/18/2017  07/29/2016: UACR was high, 148.9  - last eye exam: 08/2017: No DR - no numbness and tingling in her feet.  Hypothyroidism:  Pt is on levothyroxine 150 mcg daily, taken: - in am - fasting - at least 30 min from b'fast - no Ca, Fe, MVI - + PPIs at bedtime - not on Biotin  Latest TFTs are slightly abnormal, but we did not change the levothyroxine dose due to previous variability Lab Results  Component Value Date   TSH 4.77 (H) 04/02/2018   TSH 2.65 05/21/2017   TSH 17.64 (H) 02/18/2017   TSH 4.45 06/04/2016  TSH 2.338 04/28/2015  07/29/2016: TSH 6.18.  Lab Results  Component Value Date   FREET4 1.36 05/21/2017   FREET4 0.96 02/18/2017   Reviewed and addended previous history: She started to have severe generalized mm cramps in 03/2016.   Around that time >> RUQ pain >> HIDA scan >> GB w/o clear stones, but with sludge. She had an abd. CT scan: increased size of her liver + liver steatosis.  She was also found to have a 24-hour urine free cortisol, which returned slightly elevated: Component     Latest Ref Rng & Units 06/04/2016 06/14/2016  Cortisol (Ur), Free     4.0 - 50.0 mcg/24 h  57.0 (H)  Results received     0.63 - 2.50 g/24 h  1.27  Cortisol, Plasma     Ug/dL (collected at 5 pm) 6.8    A CT abdomen  (05/2016) showed normal adrenals.  A brain MRI (04/2015) showed normal pituitary gland.  A dexamethasone suppression test was normal >> no signs of Cushing syndrome:  Component     Latest Ref Rng & Units 07/02/2016  Cortisol - AM     mcg/dL 1.8 (L)  Dexamethasone, Serum     ng/dL 385   She had a liver Bx  >> has level 3 scarring. 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  ROS: Constitutional: no weight gain/no weight loss, + fatigue, no subjective hyperthermia, no subjective hypothermia Eyes: no blurry vision, no xerophthalmia ENT: no sore throat, no nodules palpated in neck, no dysphagia, no odynophagia, no hoarseness Cardiovascular: no CP/no SOB/no palpitations/no leg swelling Respiratory: no cough/no SOB/no wheezing Gastrointestinal: + N/no V/no D/no C/no acid reflux Musculoskeletal: no muscle aches/no joint aches Skin: no rashes, no hair loss Neurological: no tremors/no numbness/no tingling/no dizziness  I reviewed pt's medications, allergies, PMH, social hx, family hx, and changes were documented in the history of present illness. Otherwise, unchanged from my initial visit note.  Past Medical History:  Diagnosis Date  . CIN I (cervical intraepithelial neoplasia I)   . COPD (chronic obstructive pulmonary disease) ([place])   . GERD (gastroesophageal reflux disease)   . Hemochromatosis   . Hepatic steatosis   . Hypertension   . Hypothyroidism   . Labial cyst    Inclusion cyst  . Liver cirrhosis ([place])   . NASH (nonalcoholic steatohepatitis)   . Positive ANA (antinuclear antibody)   . Seizures ([place])    last seizure 3/17? over dose wellbutrin  . Thyroid disease    Graves dis-Radioactive Iodine   Past Surgical History:  Procedure Laterality Date  . CERVICAL BIOPSY  W/ LOOP ELECTRODE EXCISION     Excision labial inclusion cyst  . CESAREAN SECTION    . COLPOSCOPY    . HAND SURGERY    . SHOULDER ARTHROSCOPY WITH OPEN ROTATOR CUFF REPAIR AND  DISTAL CLAVICLE ACROMINECTOMY Left 06/13/2015   Procedure: LEFT SHOULDER ARTHROSCOPY, DEBRIDEMENT, OPEN ROTATOR CUFF REPAIR, CORACOID FRACTURE FIXATION, BICEPS TENODESIS;  Surgeon: Meredith Pel, MD;  Location: [facility];  Service: Orthopedics;  Laterality: Left;  . SHOULDER CLOSED REDUCTION Left 09/26/2015   Procedure: CLOSED MANIPULATION SHOULDER UNDER ANESTHESIA;  Surgeon: Meredith Pel, MD;  Location: [facility];  Service: Orthopedics;  Laterality: Left;  . TUBAL LIGATION     Social History   Social History  . Marital status: Married    Spouse name: Tommie Raymond  . Number of children: 1  . Years of education: 82  Occupational History  . Material handler    Social History Main Topics  . Smoking status: Former Smoker    Packs/day: 1.00    Types: Cigarettes    Quit date: 02/14/2015  . Smokeless tobacco: Never Used  . Alcohol use 0.0 oz/week     Comment: rare  . Drug use: No     Comment: Hx of marijuana use  . Sexual activity: Yes    Birth control/ protection: Surgical   Social History Narrative   Lives with husband and sister   Caffeine use: Tea/coffee daily      Current Outpatient Medications on File Prior to Visit  Medication Sig Dispense Refill  . BEVESPI AEROSPHERE 9-4.8 MCG/ACT AERO INHALE 2 PUFFS INTO THE LUNGS TWICE DAILY. REPLACES ANORO INHALER. 10.7 g 6  . dicyclomine (BENTYL) 20 MG tablet Take 1 tablet (20 mg total) by mouth 3 (three) times daily as needed for spasms (increased abdominal pain). 90 tablet 2  . Dulaglutide (TRULICITY) 1.5 WL/8.9HT SOPN INJECT 1.5 MLS INTO THE SKIN WEEKLY 4 pen 11  . empagliflozin (JARDIANCE) 10 MG TABS tablet Take 10 mg by mouth daily. 30 tablet 11  . glucose blood (BAYER CONTOUR TEST) test strip Use 2x a day 200 each 3  . Insulin Glargine (LANTUS SOLOSTAR) 100 UNIT/ML Solostar Pen INJECT 42 UNITS INTO THE SKIN AT BEDTIME 30 mL 3  . levothyroxine (SYNTHROID, LEVOTHROID) 150 MCG tablet Take 1 tablet (150 mcg total) by mouth every morning.  90 tablet 3  . losartan (COZAAR) 50 MG tablet Take 50 mg by mouth daily.    . metFORMIN (GLUCOPHAGE-XR) 500 MG 24 hr tablet Take 2 tablets (1,000 mg total) by mouth daily. 180 tablet 3  . omeprazole (PRILOSEC) 40 MG capsule Take 1 capsule (40 mg total) by mouth daily. 30 capsule 6  . ondansetron (ZOFRAN ODT) 4 MG disintegrating tablet Take 1 tablet (4 mg total) by mouth 2 (two) times daily. 60 tablet 3  . pravastatin (PRAVACHOL) 20 MG tablet Take 1 tablet (20 mg total) by mouth daily. 90 tablet 3  . SURE COMFORT PEN NEEDLES 32G X 4 MM MISC USE TO INJECT LANTUS ONCE DAILY 100 each 3   No current facility-administered medications on file prior to visit.    Allergies  Allergen Reactions  . Doxycycline Other (See Comments)    Flu like symptoms (high fever, chills)  . Nitrofurantoin Monohyd Macro Other (See Comments)    Flu like symptoms (high fever, chills)  . Septra [Sulfamethoxazole-Trimethoprim] Other (See Comments)    Flu like symptoms (high fever, chills)  . Lamictal [Lamotrigine] Rash   Family History  Problem Relation Age of Onset  . Hypertension Mother   . Heart disease Mother   . Diabetes Father   . Heart disease Father   . Heart disease Brother   . Breast cancer Maternal Grandmother   . Seizures Neg Hx   . Stomach cancer Neg Hx   . Colon cancer Neg Hx    PE: LMP 06/23/2016  Wt Readings from Last 3 Encounters:  04/02/18 169 lb (76.7 kg)  12/12/17 164 lb (74.4 kg)  10/20/17 164 lb 9.6 oz (74.7 kg)   Constitutional: overweight, in NAD Eyes: PERRLA, EOMI, no exophthalmos ENT: moist mucous membranes, no thyromegaly, no cervical lymphadenopathy Cardiovascular: RRR, No MRG Respiratory: CTA B Gastrointestinal: abdomen soft, NT, ND, BS+ Musculoskeletal: no deformities, strength intact in all 4 Skin: moist, warm, no rashes Neurological: no tremor with outstretched hands, DTR normal in all  4  ASSESSMENT: 1. DM2   2. Hypothyroidism  -Post ablative, after RAI treatment  for Graves' disease  3. HL  PLAN: 1. DM2 -Patient with longstanding, uncontrolled, type 2 diabetes, on metformin, weekly GLP-1 receptor agonist, basal insulin and now also SGLT 2 inhibitor added at last visit.  At that time, sugars are higher after the holidays.  She relaxed her diet at that time but was planning to return to the previous diet.  We added Jardiance at that time, at a low dose and advised her to stay well-hydrated. -At this visit, sugars are not all at goal, but much better compared to before.  A decrease in food portions and adding Jardiance probably contributed to the improvement.  Unfortunately, her insurance ran out that the end of last month and she will have a hearing for disability next week.  It is unclear whether she will be able to afford Trulicity and Jardiance from now on.  She states that she has not of both to cover her until the end of this month.  We do not have samples to give her... -For now, I advised her to continue the current regimen until she hears about the new insurance (disability).  After this, I advised her to call her insurance and ask about GLP-1 receptor agonist and SGLT 2 inhibitors that are covered.  If none are covered, we need to start her on mealtime insulin.  I also advised her to check with her insurance which long and short-acting insulins are covered. - I suggested to:  Patient Instructions  Please continue: - Lantus 42 units at bedtime - Metformin ER 500 mg 2x a day - Trulicity 1.5 mg weekly - Jardiance 10 mg before breakfast  If your insurance does not cover Trulicity /Jardiance, please ask them if they cover the following: - Trulicity (1x a week) - Ozempic (1x a week) - Bydureon (1x a week) - Victoza (1x a day) - Byetta (2x a day)  - [place]  If they do not cover the above, then ask them which long and short acting insulin they cover.  Please continue levothyroxine 150 mcg daily.  Take the  thyroid hormone every day, with water, at least 30 minutes before breakfast, separated by at least 4 hours from: - acid reflux medications - calcium - iron - multivitamins  Please stop at the lab.  Please return in 3 months with your sugar log.    - today, HbA1c is 6.5% (much better) - continue checking sugars at different times of the day - check 1-2x a day, rotating checks - advised for yearly eye exams >> she is UTD - At last visit, her ACR was higher, so we will repeat this today - Return to clinic in 3 mo with sugar log      2. Hypothyroidism -Post ablative - latest thyroid labs reviewed with pt >> TSH was slightly high at last visit (4.77), but due to previous variability in her TFTs, we did not change her levothyroxine dose pending reevaluation today - she continues on LT4 150 mcg daily - pt feels good on this dose. - we discussed about taking the thyroid hormone every day, with water, >30 minutes before breakfast, separated by >4 hours from acid reflux medications, calcium, iron, multivitamins. Pt. is taking it correctly. - will check thyroid tests today: TSH and fT4 - If labs are abnormal, she will need to return for repeat TFTs in 1.5 months  3.  HL  - Reviewed latest lipid panel from last visit: LDL lower than 100, HDL low, triglycerides at goal Lab Results  Component Value Date   CHOL 148 04/02/2018   HDL 28.40 (L) 04/02/2018   LDLCALC 96 04/02/2018   TRIG 118.0 04/02/2018   CHOLHDL 5 04/02/2018  - Continues pravastatin without side effects. - She does have elevated LFTs, which we are following.  We will repeat these today.  Component     Latest Ref Rng & Units 07/17/2018  Total Bilirubin     0.2 - 1.2 mg/dL 0.6  Bilirubin, Direct     0.0 - 0.3 mg/dL 0.2  Alkaline Phosphatase     39 - 117 U/L 56  AST     0 - 37 U/L 55 (H)  ALT     0 - 35 U/L 50 (H)  Total Protein     6.0 - 8.3 g/dL 8.4 (H)  Albumin     3.5 - 5.2 g/dL 4.3  Microalb, Ur     0.0 - 1.9 mg/dL  29.5 (H)  Creatinine,U     mg/dL 146.5  MICROALB/CREAT RATIO     0.0 - 30.0 mg/g 20.2  TSH     0.35 - 4.50 uIU/mL 0.12 (L)  Hemoglobin A1C     4.0 - 5.6 % 6.5 (A)  T4,Free(Direct)     0.60 - 1.60 ng/dL 1.60  LFTs much better. ACR not elevated. TSH suppressed.  We will need to decrease the dose of her levothyroxine to 137 mcg daily and recheck her tests at next visit.  Philemon Kingdom, MD PhD [facility]

## 2018-07-20 MED ORDER — LEVOTHYROXINE SODIUM 137 MCG PO TABS: 137.0000 ug | ORAL_TABLET | Freq: Every day | ORAL | 3 refills | Status: DC

## 2018-07-28 ENCOUNTER — Encounter: Payer: Self-pay | Admitting: Internal Medicine

## 2018-08-10 ENCOUNTER — Telehealth: Payer: Self-pay

## 2018-08-10 NOTE — Telephone Encounter (Signed)
Patient has been approved for [facility] medication assistance.

## 2018-08-20 ENCOUNTER — Other Ambulatory Visit: Payer: Self-pay

## 2018-08-20 MED ORDER — TRULICITY 1.5 MG/0.5ML ~~LOC~~ SOAJ: SUBCUTANEOUS | 2 refills | Status: DC

## 2018-08-21 ENCOUNTER — Telehealth: Payer: Self-pay

## 2018-08-21 NOTE — Telephone Encounter (Signed)
Refill sent to [facility] for [employer] patient assistance for [employer].

## 2018-08-21 NOTE — Telephone Encounter (Signed)
Patient approved for [employer] Patient Medication assistance 08-12-2018 to 08/12/2019.

## 2018-09-09 ENCOUNTER — Other Ambulatory Visit: Payer: Self-pay | Admitting: Internal Medicine

## 2018-10-08 ENCOUNTER — Other Ambulatory Visit: Payer: Self-pay | Admitting: Internal Medicine

## 2018-10-08 NOTE — Telephone Encounter (Signed)
Patient called to advise that she is out of Lantus.  She states that there is something in process since June to get her assistance with cost.  She needs samples, status of assistance and/or different medication in place of Lantus   Please advise at [phone]

## 2018-10-09 NOTE — Telephone Encounter (Signed)
Pt came into office to get samples as instructed by M.Conger,CMA. Pt was given 3 insulin pens of lantus, which should last approx 20 days, which should allow patient assistance medication time to be delivered.

## 2018-11-02 ENCOUNTER — Other Ambulatory Visit: Payer: Self-pay

## 2018-11-04 ENCOUNTER — Ambulatory Visit (INDEPENDENT_AMBULATORY_CARE_PROVIDER_SITE_OTHER): Payer: Self-pay | Admitting: Internal Medicine

## 2018-11-04 ENCOUNTER — Other Ambulatory Visit: Payer: Self-pay

## 2018-11-04 ENCOUNTER — Encounter: Payer: Self-pay | Admitting: Internal Medicine

## 2018-11-04 VITALS — BP 136/86 | HR 94 | Ht 62.0 in | Wt 153.0 lb

## 2018-11-04 DIAGNOSIS — E785 Hyperlipidemia, unspecified: Secondary | ICD-10-CM

## 2018-11-04 DIAGNOSIS — E039 Hypothyroidism, unspecified: Secondary | ICD-10-CM

## 2018-11-04 DIAGNOSIS — E1165 Type 2 diabetes mellitus with hyperglycemia: Secondary | ICD-10-CM

## 2018-11-04 DIAGNOSIS — Z23 Encounter for immunization: Secondary | ICD-10-CM

## 2018-11-04 LAB — POCT GLYCOSYLATED HEMOGLOBIN (HGB A1C): Hemoglobin A1C: 5.7 % — AB (ref 4.0–5.6)

## 2018-11-04 LAB — T4, FREE: Free T4: 1.68 ng/dL — ABNORMAL HIGH (ref 0.60–1.60)

## 2018-11-04 LAB — TSH: TSH: 0.1 u[IU]/mL — ABNORMAL LOW (ref 0.35–4.50)

## 2018-11-04 MED ORDER — LANTUS SOLOSTAR 100 UNIT/ML ~~LOC~~ SOPN: PEN_INJECTOR | SUBCUTANEOUS | 3 refills | Status: DC

## 2018-11-04 NOTE — Patient Instructions (Addendum)
Please reduce: - Lantus to 35 units at bedtime  Please continue: - Metformin ER 500 mg 2x a day - Trulicity 1.5 mg weekly - Jardiance 10 mg before breakfast   Please continue levothyroxine 137 mcg daily  Take the thyroid hormone every day, with water, at least 30 minutes before breakfast, separated by at least 4 hours from: - acid reflux medications - calcium - iron - multivitamins  Please stop at the lab.  Please return in 4 months with your sugar log.

## 2018-11-04 NOTE — Progress Notes (Signed)
Patient ID: Emma Glenn, female   DOB: Jan 26, 1966, 53 y.o.   MRN: [id number]   HPI: Emma Glenn is a 53 y.o.-year-old female, initially referred by her GI Dr, Dr. Hilarie Fredrickson, returning for follow-up for uncontrolled DM2, insulin dependent, and hypothyroidism. Last visit 3 months ago.  She was approved for her disability >> will start new insurance next mo.  Her Trulicity, Vania Rea are approved for Pt Assistance Pgm.  She was also getting her Lantus through the [employer] patient assistance program but even though the last application was submitted in 07/2018, she did not receive her Lantus.  She is now out.  DM2: Reviewed HbA1c levels: Lab Results  Component Value Date   HGBA1C 6.5 (A) 07/17/2018   HGBA1C 8.0 (A) 04/02/2018   HGBA1C 7.6 (A) 12/12/2017  Labs from [facility], drawn on 07/29/2016: HbA1c still very high, at 12.6%  Pt is on a regimen of: - Lantus 42 >> 38 units at bedtime - Metformin ER 500 mg 2x a day - Trulicity 1.5 mg weekly-now through patient assistance program - Jardiance 10 mg before breakfast -through patient assistance program  She checks her sugars twice a day per review of her log: - am:180-215 >> 112--now 156 >> 81-119, 138 - 2h after b'fast: n/c >> 89-188 >> 100 - before lunch: n/c >> 175 >> 254 >> n/c >> 86-109 - 2h after lunch: 160-234 >> 100-184 >> 103-131 - before dinner: 90-165, 187 >> 234 >> 139, 202 >> n/c - 2h after dinner: 157-199 >> 164 >> n/c >> 82-111 - bedtime: n/c >> 168 >> 102, 133 >> 101 - nighttime: n/c Lowest sugar was 160 >> 89 >> 81; it is unclear at which level she has hypoglycemia awareness. Highest sugar was 254 >> 202 >> 138.  [name] meals are: - Breakfast: Cereals or 2 eggs and toast - Lunch: Skips - Dinner: Meat and veggies - Snacks: Fruit  Stopped milkshakes. Occasional sodas.  Meter: Bayer Contour  -No CKD, last BUN/creatinine:  Lab Results  Component Value Date   BUN 11 04/02/2018   BUN 14 08/18/2017   CREATININE 0.72  04/02/2018   CREATININE 0.71 08/18/2017    -+ HL L; last set of lipids: Lab Results  Component Value Date   CHOL 148 04/02/2018   HDL 28.40 (L) 04/02/2018   LDLCALC 96 04/02/2018   TRIG 118.0 04/02/2018   CHOLHDL 5 04/02/2018   Restarted pravastatin 12/2016, after we consulted with her gastroenterologist, Dr. Hilarie Fredrickson.  LFTs remained stable after starting the statin but started to increase afterwards, being higher at last check in 08/2017.  Dr. Hilarie Fredrickson is aware of the above and considers these to be related to her fatty liver disease.  We continued pravastatin. Of note, she is also seen Dr. Coralyn Pear (hepatologist) and she has liver ultrasound every 6 months.  Latest LFTs were still high: Lab Results  Component Value Date   ALT 50 (H) 07/17/2018   AST 55 (H) 07/17/2018   ALKPHOS 56 07/17/2018   BILITOT 0.6 07/17/2018   ACR was high at last check: Lab Results  Component Value Date   MICRALBCREAT 20.2 07/17/2018   MICRALBCREAT 237.7 (H) 04/02/2018   MICRALBCREAT 16.1 02/18/2017  07/29/2016: UACR was high, 148.9  - last eye exam: 08/2017: No DR - no numbness and tingling in her feet.  Hypothyroidism:  Pt is on levothyroxine 137 mcg daily, taken: - in am - fasting - at least 30 min from b'fast - no Ca, Fe, MVI, +  PPIs at bedtime - not on Biotin  At last visit TSH was slightly low and we decreased her levothyroxine dose: Lab Results  Component Value Date   TSH 0.12 (L) 07/17/2018   TSH 4.77 (H) 04/02/2018   TSH 2.65 05/21/2017   TSH 17.64 (H) 02/18/2017   TSH 4.45 06/04/2016   TSH 2.338 04/28/2015  07/29/2016: TSH 6.18.  Lab Results  Component Value Date   FREET4 1.60 07/17/2018   FREET4 1.36 05/21/2017   FREET4 0.96 02/18/2017   She did not feel differently after we decrease the dose of levothyroxine.  Reviewed previous history: She started to have severe generalized mm cramps in 03/2016.   Around that time >> RUQ pain >> HIDA scan >> GB w/o clear stones, but  with sludge. She had an abd. CT scan: increased size of her liver + liver steatosis.  She was also found to have a 24-hour urine free cortisol, which returned slightly elevated: Component     Latest Ref Rng & Units 06/04/2016 06/14/2016  Cortisol (Ur), Free     4.0 - 50.0 mcg/24 h  57.0 (H)  Results received     0.63 - 2.50 g/24 h  1.27  Cortisol, Plasma     Ug/dL (collected at 5 pm) 6.8    A CT abdomen (05/2016) showed normal adrenals.  A brain MRI (04/2015) showed normal pituitary gland.  A dexamethasone suppression test was normal >> no signs of Cushing syndrome:  Component     Latest Ref Rng & Units 07/02/2016  Cortisol - AM     mcg/dL 1.8 (L)  Dexamethasone, Serum     ng/dL 385   She had a liver Bx  >> has level 3 scarring. 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  ROS: Constitutional: no weight gain/+ weight loss, no fatigue, no subjective hyperthermia, no subjective hypothermia Eyes: no blurry vision, no xerophthalmia ENT: no sore throat, no nodules palpated in neck, no dysphagia, no odynophagia, no hoarseness Cardiovascular: no CP/no SOB/no palpitations/no leg swelling Respiratory: no cough/no SOB/no wheezing Gastrointestinal: no N/no V/no D/no C/no acid reflux Musculoskeletal: no muscle aches/no joint aches Skin: no rashes, no hair loss Neurological: no tremors/no numbness/no tingling/no dizziness  I reviewed pt's medications, allergies, PMH, social hx, family hx, and changes were documented in the history of present illness. Otherwise, unchanged from my initial visit note.  Past Medical History:  Diagnosis Date  . CIN I (cervical intraepithelial neoplasia I)   . COPD (chronic obstructive pulmonary disease) ([place])   . GERD (gastroesophageal reflux disease)   . Hemochromatosis   . Hepatic steatosis   . Hypertension   . Hypothyroidism   . Labial cyst    Inclusion cyst  . Liver cirrhosis ([place])   . NASH (nonalcoholic steatohepatitis)    . Positive ANA (antinuclear antibody)   . Seizures ([place])    last seizure 3/17? over dose wellbutrin  . Thyroid disease    Graves dis-Radioactive Iodine   Past Surgical History:  Procedure Laterality Date  . CERVICAL BIOPSY  W/ LOOP ELECTRODE EXCISION     Excision labial inclusion cyst  . CESAREAN SECTION    . COLPOSCOPY    . HAND SURGERY    . SHOULDER ARTHROSCOPY WITH OPEN ROTATOR CUFF REPAIR AND DISTAL CLAVICLE ACROMINECTOMY Left 06/13/2015   Procedure: LEFT SHOULDER ARTHROSCOPY, DEBRIDEMENT, OPEN ROTATOR CUFF REPAIR, CORACOID FRACTURE FIXATION, BICEPS TENODESIS;  Surgeon: Meredith Pel, MD;  Location: [facility];  Service: Orthopedics;  Laterality: Left;  . SHOULDER CLOSED REDUCTION Left 09/26/2015   Procedure: CLOSED MANIPULATION SHOULDER UNDER ANESTHESIA;  Surgeon: Meredith Pel, MD;  Location: [facility];  Service: Orthopedics;  Laterality: Left;  . TUBAL LIGATION     Social History   Social History  . Marital status: Married    Spouse name: Tommie Raymond  . Number of children: 1  . Years of education: 65   Occupational History  . Material handler    Social History Main Topics  . Smoking status: Former Smoker    Packs/day: 1.00    Types: Cigarettes    Quit date: 02/14/2015  . Smokeless tobacco: Never Used  . Alcohol use 0.0 oz/week     Comment: rare  . Drug use: No     Comment: Hx of marijuana use  . Sexual activity: Yes    Birth control/ protection: Surgical   Social History Narrative   Lives with husband and sister   Caffeine use: Tea/coffee daily      Current Outpatient Medications on File Prior to Visit  Medication Sig Dispense Refill  . BEVESPI AEROSPHERE 9-4.8 MCG/ACT AERO INHALE 2 PUFFS INTO THE LUNGS TWICE DAILY. REPLACES ANORO INHALER. 10.7 g 6  . dicyclomine (BENTYL) 20 MG tablet Take 1 tablet (20 mg total) by mouth 3 (three) times daily as needed for spasms (increased abdominal pain). 90 tablet 2  . Dulaglutide (TRULICITY) 1.5 VQ/0.0QQ SOPN INJECT 1.5 MLS  INTO THE SKIN WEEKLY 15 pen 2  . empagliflozin (JARDIANCE) 10 MG TABS tablet Take 10 mg by mouth daily. 30 tablet 11  . glucose blood (BAYER CONTOUR TEST) test strip Use 2x a day 200 each 3  . Insulin Glargine (LANTUS SOLOSTAR) 100 UNIT/ML Solostar Pen INJECT 42 UNITS INTO THE SKIN AT BEDTIME 30 mL 3  . levothyroxine (SYNTHROID) 137 MCG tablet Take 1 tablet (137 mcg total) by mouth daily before breakfast. 90 tablet 3  . losartan (COZAAR) 50 MG tablet Take 50 mg by mouth daily.    . metFORMIN (GLUCOPHAGE-XR) 500 MG 24 hr tablet Take 2 tablets (1,000 mg total) by mouth daily. 180 tablet 3  . omeprazole (PRILOSEC) 40 MG capsule Take 1 capsule (40 mg total) by mouth daily. 30 capsule 6  . ondansetron (ZOFRAN ODT) 4 MG disintegrating tablet Take 1 tablet (4 mg total) by mouth 2 (two) times daily. 60 tablet 3  . pravastatin (PRAVACHOL) 20 MG tablet Take 1 tablet (20 mg total) by mouth daily. 90 tablet 3  . SURE COMFORT PEN NEEDLES 32G X 4 MM MISC USE TO INJECT LANTUS ONCE DAILY 100 each 3   No current facility-administered medications on file prior to visit.    Allergies  Allergen Reactions  . Doxycycline Other (See Comments)    Flu like symptoms (high fever, chills)  . Nitrofurantoin Monohyd Macro Other (See Comments)    Flu like symptoms (high fever, chills)  . Septra [Sulfamethoxazole-Trimethoprim] Other (See Comments)    Flu like symptoms (high fever, chills)  . Lamictal [Lamotrigine] Rash   Family History  Problem Relation Age of Onset  . Hypertension Mother   . Heart disease Mother   . Diabetes Father   . Heart disease Father   . Heart disease Brother   . Breast cancer Maternal Grandmother   . Seizures Neg Hx   . Stomach cancer Neg Hx   . Colon cancer Neg Hx    PE: BP 136/86   Pulse 94   Ht 5' 2"  (1.575 m)  Wt 153 lb (69.4 kg)   LMP 06/23/2016   SpO2 97%   BMI 27.98 kg/m  Wt Readings from Last 3 Encounters:  11/04/18 153 lb (69.4 kg)  07/17/18 159 lb (72.1 kg)   04/02/18 169 lb (76.7 kg)   Constitutional: normal weight, in NAD Eyes: PERRLA, EOMI, no exophthalmos ENT: moist mucous membranes, no thyromegaly, no cervical lymphadenopathy Cardiovascular: RRR, No MRG Respiratory: CTA B Gastrointestinal: abdomen soft, NT, ND, BS+ Musculoskeletal: no deformities, strength intact in all 4 Skin: moist, warm, no rashes Neurological: no tremor with outstretched hands, DTR normal in all 4  ASSESSMENT: 1. DM2   2. Hypothyroidism  -Post ablative, after RAI treatment for Graves' disease  3. HL  PLAN: 1. DM2 -Patient with longstanding, uncontrolled, type 2 diabetes, on metformin, SGLT 2 inhibitor, long-acting insulin and weekly GLP-1 receptor agonist.  Fortunately, she was able to obtain Trulicity, Jardiance, and Lantus through the patient assistance programs from the respective pharmaceutical companies.  As of now, she is out of Lantus and we called [employer] today >> application has been received but not processed in the last 3 months.  They were resubmitted for urgent processing now.  In the meantime, we gave her a pen of Lantus to last her until she received a new shipment.  Also, she will change her insurance next month as she has just been approved for disability. -Since last visit, she has not checked sugars consistently, but they were at goal whenever he checked.  No low or hyperglycemic spikes.   -We discussed about checking sugars consistently but at this point we can start reducing her Lantus dose.  We will continue the rest of the regimen for now. - I suggested to:  Patient Instructions  Please reduce: - Lantus to 35 units at bedtime  Please continue: - Metformin ER 500 mg 2x a day - Trulicity 1.5 mg weekly - Jardiance 10 mg before breakfast   Please continue levothyroxine 137 mcg daily  Take the thyroid hormone every day, with water, at least 30 minutes before breakfast, separated by at least 4 hours from: - acid reflux medications -  calcium - iron - multivitamins  Please stop at the lab.  Please return in 4 months with your sugar log.   - we checked her HbA1c: 5.7% (much better) - advised to check sugars at different times of the day - 2x a day, rotating check times - advised for yearly eye exams >> she is UTD - return to clinic in 3-4 months      2. Hypothyroidism -Post ablative - latest thyroid labs reviewed with pt >> TSH was suppressed at last visit after which we decreased her levothyroxine dose - she continues on LT4 137 mcg daily - pt feels good on this dose. - we discussed about taking the thyroid hormone every day, with water, >30 minutes before breakfast, separated by >4 hours from acid reflux medications, calcium, iron, multivitamins. Pt. is taking it correctly. - will check thyroid tests today: TSH and fT4 - If labs are abnormal, she will need to return for repeat TFTs in 1.5 months  3. HL  - Reviewed latest lipid panel from 03/2018: At goal with the exception of a low HDL Lab Results  Component Value Date   CHOL 148 04/02/2018   HDL 28.40 (L) 04/02/2018   LDLCALC 96 04/02/2018   TRIG 118.0 04/02/2018   CHOLHDL 5 04/02/2018  - Continues pravastatin without side effects.  Component     Latest  Ref Rng & Units 11/04/2018  TSH     0.35 - 4.50 uIU/mL 0.10 (L)  Hemoglobin A1C     4.0 - 5.6 % 5.7 (A)  T4,Free(Direct)     0.60 - 1.60 ng/dL 1.68 (H)   TSH suppressed.  We will need to decrease the dose of levothyroxine further, 125 mcg daily and will recheck her TFTs at next visit.  Philemon Kingdom, MD PhD [facility]

## 2018-11-05 MED ORDER — LEVOTHYROXINE SODIUM 125 MCG PO TABS: 125.0000 ug | ORAL_TABLET | Freq: Every day | ORAL | 3 refills | Status: DC

## 2018-11-10 ENCOUNTER — Telehealth: Payer: Self-pay

## 2018-11-10 NOTE — Telephone Encounter (Signed)
Shipment of Lantus arrived and placed in fridge.

## 2019-01-04 ENCOUNTER — Telehealth: Payer: Self-pay

## 2019-01-04 NOTE — Telephone Encounter (Signed)
Forms for refill of Lantus through PAP filled out, signed by Dr. Cruzita Lederer and faxed to [facility] with confirmation.

## 2019-01-12 NOTE — Telephone Encounter (Signed)
sw pt to let her know her lantus solostar pens are here to be picked up.

## 2019-01-27 ENCOUNTER — Other Ambulatory Visit: Payer: Self-pay

## 2019-01-27 MED ORDER — METFORMIN HCL ER 500 MG PO TB24: 1000.0000 mg | ORAL_TABLET | Freq: Every day | ORAL | 3 refills | Status: DC

## 2019-03-10 ENCOUNTER — Other Ambulatory Visit: Payer: Self-pay

## 2019-03-10 ENCOUNTER — Encounter: Payer: Self-pay | Admitting: Internal Medicine

## 2019-03-10 ENCOUNTER — Ambulatory Visit: Payer: Medicare HMO | Admitting: Internal Medicine

## 2019-03-10 ENCOUNTER — Telehealth: Payer: Self-pay

## 2019-03-10 VITALS — BP 130/80 | HR 88 | Ht 62.0 in | Wt 143.0 lb

## 2019-03-10 DIAGNOSIS — J449 Chronic obstructive pulmonary disease, unspecified: Secondary | ICD-10-CM | POA: Diagnosis not present

## 2019-03-10 DIAGNOSIS — E785 Hyperlipidemia, unspecified: Secondary | ICD-10-CM

## 2019-03-10 DIAGNOSIS — E1165 Type 2 diabetes mellitus with hyperglycemia: Secondary | ICD-10-CM | POA: Diagnosis not present

## 2019-03-10 DIAGNOSIS — E039 Hypothyroidism, unspecified: Secondary | ICD-10-CM | POA: Diagnosis not present

## 2019-03-10 DIAGNOSIS — K7460 Unspecified cirrhosis of liver: Secondary | ICD-10-CM | POA: Diagnosis not present

## 2019-03-10 LAB — T4, FREE: Free T4: 1.21 ng/dL (ref 0.60–1.60)

## 2019-03-10 LAB — TSH: TSH: 0.33 u[IU]/mL — ABNORMAL LOW (ref 0.35–4.50)

## 2019-03-10 LAB — POCT GLYCOSYLATED HEMOGLOBIN (HGB A1C): Hemoglobin A1C: 5.2 % (ref 4.0–5.6)

## 2019-03-10 LAB — LIPID PANEL
Cholesterol: 179 mg/dL (ref 0–200)
HDL: 27.6 mg/dL — ABNORMAL LOW (ref >39.00)
LDL Cholesterol: 125 mg/dL — ABNORMAL HIGH (ref 0–99)
NonHDL: 151.11
Total CHOL/HDL Ratio: 6
Triglycerides: 132 mg/dL (ref 0.0–149.0)
VLDL: 26.4 mg/dL (ref 0.0–40.0)

## 2019-03-10 NOTE — Telephone Encounter (Signed)
Refill form for Lantus filled out and faxed to [facility].

## 2019-03-10 NOTE — Progress Notes (Addendum)
Patient ID: Emma Glenn, female   DOB: [birth year], 54 y.o.   MRN: [id number]   This visit occurred during the SARS-CoV-2 public health emergency.  Safety protocols were in place, including screening questions prior to the visit, additional usage of staff PPE, and extensive cleaning of exam room while observing appropriate contact time as indicated for disinfecting solutions.   HPI: Emma Glenn is a 54 y.o.-year-old female, initially referred by her GI Dr, Dr. Hilarie Fredrickson, returning for follow-up for uncontrolled DM2, insulin dependent, and hypothyroidism. Last visit 3 months ago.  She was approved for her disability >> will start new insurance next mo.  Her Trulicity, Vania Rea are approved for Pt Assistance Pgm.  She was also getting her Lantus through the [employer] patient assistance program but even though the last application was submitted in 07/2018, she did not receive her Lantus.  She is now out.  DM2: Reviewed HbA1c levels: Lab Results  Component Value Date   HGBA1C 5.7 (A) 11/04/2018   HGBA1C 6.5 (A) 07/17/2018   HGBA1C 8.0 (A) 04/02/2018  Labs from [place], drawn on 07/29/2016: HbA1c still very high, at 12.6%  Pt is on a regimen of: - Lantus 42 >> 38 >> 42 units at bedtime - Metformin ER 500 mg 2x a day - Trulicity 1.5 mg weekly-now through patient assistance program - Jardiance 10 mg before breakfast -through patient assistance program  She checks her sugars 1-2x a day: - am:180-215 >> 112--now 156 >> 81-119, 138 >> 75-106 - 2h after b'fast: n/c >> 89-188 >> 100 >> n/c - before lunch: n/c >> 175 >> 254 >> n/c >> 86-109 >> n/c - 2h after lunch: 160-234 >> 100-184 >> 103-131 >> 125 - before dinner: 90-165, 187 >> 234 >> 139, 202 >> n/c - 2h after dinner: 157-199 >> 164 >> n/c >> 82-111 >> 98, 104 - bedtime: n/c >> 168 >> 102, 133 >> 101 >> 90, 103 - nighttime: n/c Lowest sugar was 160 >> 89 >> 81 >> 75; Unclear at which level she has hypoglycemia awareness. Highest sugar was 254  >> 202 >> 138 >> 125.  [name] meals are: - Breakfast: Cereals or 2 eggs and toast - Lunch: Skips - Dinner: Meat and veggies - Snacks: Fruit  Stopped milkshakes. Occasional sodas.  Meter: Bayer Contour  -No CKD, last BUN/creatinine:  Lab Results  Component Value Date   BUN 11 04/02/2018   BUN 14 08/18/2017   CREATININE 0.72 04/02/2018   CREATININE 0.71 08/18/2017    -+HL; last set of lipids: Lab Results  Component Value Date   CHOL 148 04/02/2018   HDL 28.40 (L) 04/02/2018   LDLCALC 96 04/02/2018   TRIG 118.0 04/02/2018   CHOLHDL 5 04/02/2018   Restarted pravastatin 12/2016, after we consulted with her gastroenterologist, Dr. Hilarie Fredrickson.  LFTs remained stable after starting the statin but started to increase afterwards, being higher at last check in 08/2017.  Dr. Hilarie Fredrickson is aware of the above and considers these to be related to her fatty liver disease.  We continued pravastatin. Of note, she is also seen Dr. Coralyn Pear (hepatologist) and she has liver ultrasound every 6 months.  Latest LFTs were still high: Lab Results  Component Value Date   ALT 50 (H) 07/17/2018   AST 55 (H) 07/17/2018   ALKPHOS 56 07/17/2018   BILITOT 0.6 07/17/2018   ACR was high at last check: Lab Results  Component Value Date   MICRALBCREAT 20.2 07/17/2018   MICRALBCREAT 237.7 (H) 04/02/2018  MICRALBCREAT 16.1 02/18/2017  07/29/2016: UACR was high, 148.9  - last eye exam: 08/2017: No DR - no  numbness and tingling in her feet.  Hypothyroidism:  Pt is on levothyroxine 125 mcg daily, taken: - in am - fasting - at least 30 min from b'fast - no Ca, Fe, MVI, + PPIs at bedtime - not on Biotin  At last visit TSH was still low so we reduce the dose of levothyroxine: Lab Results  Component Value Date   TSH 0.10 (L) 11/04/2018   TSH 0.12 (L) 07/17/2018   TSH 4.77 (H) 04/02/2018   TSH 2.65 05/21/2017   TSH 17.64 (H) 02/18/2017   TSH 4.45 06/04/2016   TSH 2.338 04/28/2015  07/29/2016: TSH 6.18.   Lab Results  Component Value Date   FREET4 1.68 (H) 11/04/2018   FREET4 1.60 07/17/2018   FREET4 1.36 05/21/2017   FREET4 0.96 02/18/2017   Reviewed previous history: She started to have severe generalized mm cramps in 03/2016.   Around that time >> RUQ pain >> HIDA scan >> GB w/o clear stones, but with sludge. She had an abd. CT scan: increased size of her liver + liver steatosis.  She was also found to have a 24-hour urine free cortisol, which returned slightly elevated: Component     Latest Ref Rng & Units 06/04/2016 06/14/2016  Cortisol (Ur), Free     4.0 - 50.0 mcg/24 h  57.0 (H)  Results received     0.63 - 2.50 g/24 h  1.27  Cortisol, Plasma     Ug/dL (collected at 5 pm) 6.8    A CT abdomen (05/2016) showed normal adrenals.  A brain MRI (04/2015) showed normal pituitary gland.  A dexamethasone suppression test was normal >> no signs of Cushing syndrome:  Component     Latest Ref Rng & Units 07/02/2016  Cortisol - AM     mcg/dL 1.8 (L)  Dexamethasone, Serum     ng/dL 385   She had a liver Bx  >> has level 3 scarring. 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  Her mother fell and had a hip fx (she also has Alzheimer's ds.) >> lives with pt >> she is more stressed.  ROS: Constitutional: no weight gain/+ weight loss, + fatigue, no subjective hyperthermia, no subjective hypothermia Eyes: no blurry vision, no xerophthalmia ENT: no sore throat, no nodules palpated in neck, no dysphagia, no odynophagia, no hoarseness Cardiovascular: no CP/no SOB/no palpitations/no leg swelling Respiratory: no cough/no SOB/no wheezing Gastrointestinal: + N/no V/no D/no C/no acid reflux/+ AP Musculoskeletal: no muscle aches/no joint aches Skin: no rashes, no hair loss Neurological: no tremors/no numbness/no tingling/no dizziness  I reviewed pt's medications, allergies, PMH, social hx, family hx, and changes were documented in the history of present illness.  Otherwise, unchanged from my initial visit note.  Past Medical History:  Diagnosis Date  . CIN I (cervical intraepithelial neoplasia I)   . COPD (chronic obstructive pulmonary disease) ([place])   . GERD (gastroesophageal reflux disease)   . Hemochromatosis   . Hepatic steatosis   . Hypertension   . Hypothyroidism   . Labial cyst    Inclusion cyst  . Liver cirrhosis ([place])   . NASH (nonalcoholic steatohepatitis)   . Positive ANA (antinuclear antibody)   . Seizures ([place])    last seizure 3/17? over dose wellbutrin  . Thyroid disease    Graves dis-Radioactive Iodine   Past Surgical History:  Procedure Laterality  Date  . CERVICAL BIOPSY  W/ LOOP ELECTRODE EXCISION     Excision labial inclusion cyst  . CESAREAN SECTION    . COLPOSCOPY    . HAND SURGERY    . SHOULDER ARTHROSCOPY WITH OPEN ROTATOR CUFF REPAIR AND DISTAL CLAVICLE ACROMINECTOMY Left 06/13/2015   Procedure: LEFT SHOULDER ARTHROSCOPY, DEBRIDEMENT, OPEN ROTATOR CUFF REPAIR, CORACOID FRACTURE FIXATION, BICEPS TENODESIS;  Surgeon: Meredith Pel, MD;  Location: [facility];  Service: Orthopedics;  Laterality: Left;  . SHOULDER CLOSED REDUCTION Left 09/26/2015   Procedure: CLOSED MANIPULATION SHOULDER UNDER ANESTHESIA;  Surgeon: Meredith Pel, MD;  Location: [facility];  Service: Orthopedics;  Laterality: Left;  . TUBAL LIGATION     Social History   Social History  . Marital status: Married    Spouse name: Tommie Raymond  . Number of children: 1  . Years of education: 11   Occupational History  . Material handler    Social History Main Topics  . Smoking status: Former Smoker    Packs/day: 1.00    Types: Cigarettes    Quit date: 02/14/2015  . Smokeless tobacco: Never Used  . Alcohol use 0.0 oz/week     Comment: rare  . Drug use: No     Comment: Hx of marijuana use  . Sexual activity: Yes    Birth control/ protection: Surgical   Social History Narrative   Lives with husband and sister   Caffeine use: Tea/coffee daily       Current Outpatient Medications on File Prior to Visit  Medication Sig Dispense Refill  . BEVESPI AEROSPHERE 9-4.8 MCG/ACT AERO INHALE 2 PUFFS INTO THE LUNGS TWICE DAILY. REPLACES ANORO INHALER. 10.7 g 6  . dicyclomine (BENTYL) 20 MG tablet Take 1 tablet (20 mg total) by mouth 3 (three) times daily as needed for spasms (increased abdominal pain). 90 tablet 2  . Dulaglutide (TRULICITY) 1.5 ZO/1.0RU SOPN INJECT 1.5 MLS INTO THE SKIN WEEKLY 15 pen 2  . empagliflozin (JARDIANCE) 10 MG TABS tablet Take 10 mg by mouth daily. 30 tablet 11  . glucose blood (BAYER CONTOUR TEST) test strip Use 2x a day 200 each 3  . Insulin Glargine (LANTUS SOLOSTAR) 100 UNIT/ML Solostar Pen INJECT 35 UNITS INTO THE SKIN AT BEDTIME 30 mL 3  . levothyroxine (SYNTHROID) 125 MCG tablet Take 1 tablet (125 mcg total) by mouth daily. 90 tablet 3  . losartan (COZAAR) 50 MG tablet Take 50 mg by mouth daily.    . metFORMIN (GLUCOPHAGE-XR) 500 MG 24 hr tablet Take 2 tablets (1,000 mg total) by mouth daily. 180 tablet 3  . omeprazole (PRILOSEC) 40 MG capsule Take 1 capsule (40 mg total) by mouth daily. 30 capsule 6  . ondansetron (ZOFRAN ODT) 4 MG disintegrating tablet Take 1 tablet (4 mg total) by mouth 2 (two) times daily. 60 tablet 3  . pravastatin (PRAVACHOL) 20 MG tablet Take 1 tablet (20 mg total) by mouth daily. 90 tablet 3  . SURE COMFORT PEN NEEDLES 32G X 4 MM MISC USE TO INJECT LANTUS ONCE DAILY 100 each 3   No current facility-administered medications on file prior to visit.   Allergies  Allergen Reactions  . Doxycycline Other (See Comments)    Flu like symptoms (high fever, chills)  . Nitrofurantoin Monohyd Macro Other (See Comments)    Flu like symptoms (high fever, chills)  . Septra [Sulfamethoxazole-Trimethoprim] Other (See Comments)    Flu like symptoms (high fever, chills)  . Lamictal [Lamotrigine] Rash   Family History  Problem Relation Age of Onset  . Hypertension Mother   . Heart disease Mother   .  Diabetes Father   . Heart disease Father   . Heart disease Brother   . Breast cancer Maternal Grandmother   . Seizures Neg Hx   . Stomach cancer Neg Hx   . Colon cancer Neg Hx    PE: BP 130/80   Pulse 88   Ht 5' 2"  (1.575 m)   Wt 143 lb (64.9 kg)   LMP 06/23/2016   SpO2 96%   BMI 26.16 kg/m  Wt Readings from Last 3 Encounters:  03/10/19 143 lb (64.9 kg)  11/04/18 153 lb (69.4 kg)  07/17/18 159 lb (72.1 kg)   Constitutional: overweight, in NAD Eyes: PERRLA, EOMI, no exophthalmos ENT: moist mucous membranes, no thyromegaly, no cervical lymphadenopathy Cardiovascular: RRR, No MRG Respiratory: CTA B Gastrointestinal: abdomen soft, NT, ND, BS+ Musculoskeletal: no deformities, strength intact in all 4 Skin: moist, warm, no rashes Neurological: no tremor with outstretched hands, DTR normal in all 4  ASSESSMENT: 1. DM2   2. Hypothyroidism  -Post ablative, after RAI treatment for Graves' disease  3. HL  PLAN: 1. DM2 -Patient with longstanding, controlled, type 2 diabetes, on meds, SGLT2 inhibitor, long-acting insulin and weekly GLP-1 receptor agonist with impressive improvement in control over the last several months.  At last visit I advised her to decrease the dose of Lantus but she had dietary indiscretions over the holidays and she increased it back, now taking 42 units.  At this visit, sugars are all at goal so I again advised her to decrease the dose to 36 units and then continue to decrease from there if the sugars remain stable.  For now, we will continue Metformin, Trulicity, and Jardiance.  If her diabetes continues to be as well controlled as now, it is possible that she will be able to come off insulin or use a very small dose.  However, for now, I advised him to stop sweets (she is eating ice cream at night). -We will check annual labs today - I suggested to:  Patient Instructions  Please decrease: - Lantus to 36 units at bedtime (continue to decrease by 6 units  every week) if sugars remain controlled.  Please continue: - Metformin ER 500 mg 2x a day - Trulicity 1.5 mg weekly - Jardiance 10 mg before breakfast   Please continue levothyroxine 125 mcg daily  Take the thyroid hormone every day, with water, at least 30 minutes before breakfast, separated by at least 4 hours from: - acid reflux medications - calcium - iron - multivitamins  Please stop in the lab  Please return in 4 months with your sugar log.   - we checked her HbA1c: 5.2% (better) - advised to check sugars at different times of the day - 1x a day, rotating check times - advised for yearly eye exams >> she is UTD and has an appointment scheduled for next month - return to clinic in 3-4 months      2. Hypothyroidism -Post ablative - latest thyroid labs reviewed with pt >> low TSH , after which we decreased the dose of her levothyroxine Lab Results  Component Value Date   TSH 0.10 (L) 11/04/2018   - she continues on LT4 125 mcg daily - pt feels good on this dose. - we discussed about taking the thyroid hormone every day, with water, >30 minutes before breakfast, separated by >4 hours from acid reflux medications, calcium,  iron, multivitamins. Pt. is taking it correctly. - will check thyroid tests today: TSH and fT4 - If labs are abnormal, she will need to return for repeat TFTs in 1.5 months  3. HL  -Reviewed latest lipid panel from 03/2018:HDL low, LDL <100 Lab Results  Component Value Date   CHOL 148 04/02/2018   HDL 28.40 (L) 04/02/2018   LDLCALC 96 04/02/2018   TRIG 118.0 04/02/2018   CHOLHDL 5 04/02/2018  -She continues pravastatin without side effects - will check lipids today >> fasting  Component     Latest Ref Rng & Units 03/10/2019  Glucose     65 - 99 mg/dL 75  BUN     7 - 25 mg/dL 10  Creatinine     0.50 - 1.05 mg/dL 0.62  GFR, Est Non African American     > OR = 60 mL/min/1.45m 102  GFR, Est African American     > OR = 60 mL/min/1.730m118   BUN/Creatinine Ratio     6 - 22 (calc) NOT APPLICABLE  Sodium     13703 146 mmol/L 138  Potassium     3.5 - 5.3 mmol/L 4.2  Chloride     98 - 110 mmol/L 104  CO2     20 - 32 mmol/L 22  Calcium     8.6 - 10.4 mg/dL 9.1  Total Protein     6.1 - 8.1 g/dL 7.4  Albumin MSPROF     3.6 - 5.1 g/dL 4.2  Globulin     1.9 - 3.7 g/dL (calc) 3.2  AG Ratio     1.0 - 2.5 (calc) 1.3  Total Bilirubin     0.2 - 1.2 mg/dL 0.5  Alkaline phosphatase (APISO)     37 - 153 U/L 64  AST     10 - 35 U/L 20  ALT     6 - 29 U/L 17  Cholesterol     0 - 200 mg/dL 179  Triglycerides     0.0 - 149.0 mg/dL 132.0  HDL Cholesterol     >39.00 mg/dL 27.60 (L)  VLDL     0.0 - 40.0 mg/dL 26.4  LDL (calc)     0 - 99 mg/dL 125 (H)  Total CHOL/HDL Ratio      6  NonHDL      151.11  TSH     0.35 - 4.50 uIU/mL 0.33 (L)  Hemoglobin A1C     4.0 - 5.6 % 5.2  T4,Free(Direct)     0.60 - 1.60 ng/dL 1.21   TSH is improved and is now only slightly on the lower limit of normal.  For now, I would suggest to continue the same dose of levothyroxine we will recheck her TFTs at next visit. Her LDL is higher.  I will check with her if she skips any pravastatin doses. AST and ALT are now normal!  CrPhilemon KingdomMD PhD [facility]

## 2019-03-10 NOTE — Patient Instructions (Addendum)
Please decrease: - Lantus to 36 units at bedtime (continue to decrease by 6 units every week) if sugars remain controlled.  Please continue: - Metformin ER 500 mg 2x a day - Trulicity 1.5 mg weekly - Jardiance 10 mg before breakfast   Please continue levothyroxine 125 mcg daily  Take the thyroid hormone every day, with water, at least 30 minutes before breakfast, separated by at least 4 hours from: - acid reflux medications - calcium - iron - multivitamins  Please stop in the lab  Please return in 4 months with your sugar log.

## 2019-03-10 NOTE — Addendum Note (Signed)
Addended by: Cardell Peach I on: 03/10/2019 11:42 AM   Modules accepted: Orders

## 2019-03-11 LAB — COMPLETE METABOLIC PANEL WITH GFR
AG Ratio: 1.3 (calc) (ref 1.0–2.5)
ALT: 17 U/L (ref 6–29)
AST: 20 U/L (ref 10–35)
Albumin: 4.2 g/dL (ref 3.6–5.1)
Alkaline phosphatase (APISO): 64 U/L (ref 37–153)
BUN: 10 mg/dL (ref 7–25)
CO2: 22 mmol/L (ref 20–32)
Calcium: 9.1 mg/dL (ref 8.6–10.4)
Chloride: 104 mmol/L (ref 98–110)
Creat: 0.62 mg/dL (ref 0.50–1.05)
GFR, Est African American: 118 mL/min/{1.73_m2} (ref >=60)
GFR, Est Non African American: 102 mL/min/{1.73_m2} (ref >=60)
Globulin: 3.2 g/dL (calc) (ref 1.9–3.7)
Glucose, Bld: 75 mg/dL (ref 65–99)
Potassium: 4.2 mmol/L (ref 3.5–5.3)
Sodium: 138 mmol/L (ref 135–146)
Total Bilirubin: 0.5 mg/dL (ref 0.2–1.2)
Total Protein: 7.4 g/dL (ref 6.1–8.1)

## 2019-03-18 ENCOUNTER — Telehealth: Payer: Self-pay

## 2019-03-18 NOTE — Telephone Encounter (Signed)
Patient assistance medication Lantus received and ready for pick up.

## 2019-04-02 ENCOUNTER — Ambulatory Visit: Payer: Medicare HMO | Admitting: Internal Medicine

## 2019-04-02 ENCOUNTER — Other Ambulatory Visit: Payer: Self-pay

## 2019-04-02 ENCOUNTER — Encounter: Payer: Self-pay | Admitting: Internal Medicine

## 2019-04-02 VITALS — BP 154/86 | HR 94 | Temp 98.3°F | Ht 62.0 in | Wt 143.5 lb

## 2019-04-02 DIAGNOSIS — R1011 Right upper quadrant pain: Secondary | ICD-10-CM

## 2019-04-02 DIAGNOSIS — K7581 Nonalcoholic steatohepatitis (NASH): Secondary | ICD-10-CM | POA: Diagnosis not present

## 2019-04-02 DIAGNOSIS — K219 Gastro-esophageal reflux disease without esophagitis: Secondary | ICD-10-CM

## 2019-04-02 DIAGNOSIS — K7400 Hepatic fibrosis, unspecified: Secondary | ICD-10-CM

## 2019-04-02 NOTE — Progress Notes (Signed)
Subjective:    Patient ID: Emma Glenn, female    DOB: [birth year], 54 y.o.   MRN: [id number]  HPI Emma Glenn is a 54 year old female with a history of NASH with advanced fibrosis (F3 by biopsy), insulin-dependent diabetes, history of Graves' disease status post ablation with subsequent hypothyroidism, hypertension, hyperlipidemia, COPD who is here for follow-up.  She was last seen on 08/18/2017.  She is here alone today.  She reports on the whole she is doing better.  Her blood sugars have come under much better control and she has been successful at losing 20 to 25 pounds.  She reports she is having good and bad days but overall feels better than when I saw her last.  She works closely with endocrinology, Dr. Cruzita Lederer, on controlling her diabetes and hypothyroidism.  Her A1c has declined dramatically to 5.2%.  She reports she still has intermittent right upper quadrant abdominal pain which is about the same but is present at some point most days of the week.  Worse with activity.  Not related to eating.  Does not really radiate.  Bowels have not changed and remain rather loose 3-4 times a day without blood or melena.  Omeprazole controls her reflux well.  Her disability was approved in October or November 2020 and thus she has medical insurance again.  She has had increased stress levels recently.  Her mother broke her hip in November 2020 and she has been living with her since.  She was able to stop smoking but started again in the summer 2020.  She is hoping to quit again soon.   Review of Systems As per HPI, otherwise negative  Current Medications, Allergies, Past Medical History, Past Surgical History, Family History and Social History were reviewed in [employer] record.     Objective:   Physical Exam BP (!) 154/86 (BP Location: Left Arm, Patient Position: Sitting, Cuff Size: Normal)   Pulse 94   Temp 98.3 F (36.8 C)   Ht _0  (1.575 m)   Wt 143 lb 8 oz  (65.1 kg)   LMP 06/23/2016   SpO2 97%   BMI 26.25 kg/m  Gen: awake, alert, NAD HEENT: anicteric, op clear CV: RRR, no mrg Pulm: CTA b/l Abd: soft, ND, +BS throughout, liver is slightly enlarged about 2 fingerbreadths below right costal margin and mildly tender Ext: no c/c/e Neuro: nonfocal  CMP     Component Value Date/Time   NA 138 03/10/2019 1145   NA 137 06/25/2016 1347   K 4.2 03/10/2019 1145   CL 104 03/10/2019 1145   CO2 22 03/10/2019 1145   GLUCOSE 75 03/10/2019 1145   BUN 10 03/10/2019 1145   BUN 14 06/25/2016 1347   CREATININE 0.62 03/10/2019 1145   CALCIUM 9.1 03/10/2019 1145   PROT 7.4 03/10/2019 1145   ALBUMIN 4.3 07/17/2018 1100   AST 20 03/10/2019 1145   ALT 17 03/10/2019 1145   ALKPHOS 56 07/17/2018 1100   BILITOT 0.5 03/10/2019 1145   GFRNONAA 102 03/10/2019 1145   GFRAA 118 03/10/2019 1145         Assessment & Plan:  54 year old female with a history of NASH with advanced fibrosis (F3 by biopsy), insulin-dependent diabetes, history of Graves' disease status post ablation with subsequent hypothyroidism, hypertension, hyperlipidemia, COPD who is here for follow-up.   1. NASH with F3 fibrosis --she has made tremendous strides in her risk factor modification since I saw her about 2 years ago.  Her blood sugars have come under much better control as evidenced by hemoglobin A1c of 5.2%.  She is also lost weight and normalized her liver enzymes.  We discussed how significant this is today and if she is able to continue to control risk factors and minimize liver inflammation, it is likely that her fibrosis will not advance as quickly.  There is certainly no evidence for decompensated liver disease at this time.  AST and ALT when checked just a few weeks ago were 20 and 17 respectively.  Alk phos was normal at 56 and total bilirubin 0.5.  Of note there was question about autoimmune hepatitis when we were initially evaluating her liver inflammation by biopsy proved  fatty liver related.  She did have a positive ANA and smooth muscle antibody but IgG was normal.  Again histology did not support autoimmune liver disease. --Continue aggressive risk factor modifications to control weight, blood glucose, hypertension and hyperlipidemia.  Continue statin therapy --MRI abdomen with and without contrast is recommended to evaluate liver parenchyma, if there is evidence for portal hypertension by MRI then I would recommend we repeat variceal screening --Check INR at follow-up --58-monthfollow-up with me  2.  Chronic right upper quadrant pain --persistent though stable.  Previously normal HIDA scan.  We will make sure the MRI above does not reveal other pathology.  3.  GERD --history of reflux now controlled with omeprazole, continue 40 mg daily  4.  Colon cancer screening --up-to-date with colonoscopy, normal in February 2018, repeat in 2028  30 minutes total spent today including patient facing time, coordination of care, reviewing medical history/procedures/pertinent radiology studies, and documentation of the encounter.

## 2019-04-02 NOTE — Patient Instructions (Addendum)
Continue omeprazole. ________________________________________________________________  Dennis Bast have been scheduled for an MRI at [facility] on Monday 04/12/19. Your appointment time is 4:30 pm. Please arrive 15 minutes prior to your appointment time for registration purposes. Please make certain not to have anything to eat or drink 6 hours prior to your test. In addition, if you have any metal in your body, have a pacemaker or defibrillator, please be sure to let your ordering physician know. This test typically takes 45 minutes to 1 hour to complete. Should you need to reschedule, please call [phone] to do so. _______________________________________________________________ Doristine Devoid job on weight loss and getting those liver enzymes down! _______________________________________________________________ If you are age 79 or older, your body mass index should be between 23-30. Your Body mass index is 26.25 kg/m. If this is out of the aforementioned range listed, please consider follow up with your Primary Care Provider.  If you are age 66 or younger, your body mass index should be between 19-25. Your Body mass index is 26.25 kg/m. If this is out of the aformentioned range listed, please consider follow up with your Primary Care Provider.  ______________________________________________________________ Due to recent changes in healthcare laws, you may see the results of your imaging and laboratory studies on MyChart before your provider has had a chance to review them.  We understand that in some cases there may be results that are confusing or concerning to you. Not all laboratory results come back in the same time frame and the provider may be waiting for multiple results in order to interpret others.  Please give [place] 48 hours in order for your provider to thoroughly review all the results before contacting the office for clarification of your results.

## 2019-04-08 ENCOUNTER — Encounter: Payer: Self-pay | Admitting: Internal Medicine

## 2019-04-08 DIAGNOSIS — H35363 Drusen (degenerative) of macula, bilateral: Secondary | ICD-10-CM | POA: Diagnosis not present

## 2019-04-08 DIAGNOSIS — H04123 Dry eye syndrome of bilateral lacrimal glands: Secondary | ICD-10-CM | POA: Diagnosis not present

## 2019-04-08 DIAGNOSIS — H2513 Age-related nuclear cataract, bilateral: Secondary | ICD-10-CM | POA: Diagnosis not present

## 2019-04-08 DIAGNOSIS — H25013 Cortical age-related cataract, bilateral: Secondary | ICD-10-CM | POA: Diagnosis not present

## 2019-04-08 DIAGNOSIS — H524 Presbyopia: Secondary | ICD-10-CM | POA: Diagnosis not present

## 2019-04-08 LAB — HM DIABETES EYE EXAM

## 2019-04-12 ENCOUNTER — Ambulatory Visit (HOSPITAL_COMMUNITY)
Admission: RE | Admit: 2019-04-12 | Discharge: 2019-04-12 | Disposition: A | Discharge status: Home / Self Care | Payer: Medicare HMO | Source: Ambulatory Visit | Attending: Internal Medicine | Admitting: Internal Medicine

## 2019-04-12 ENCOUNTER — Other Ambulatory Visit: Payer: Self-pay

## 2019-04-12 DIAGNOSIS — K7400 Hepatic fibrosis, unspecified: Secondary | ICD-10-CM

## 2019-04-12 DIAGNOSIS — R1011 Right upper quadrant pain: Secondary | ICD-10-CM | POA: Diagnosis not present

## 2019-04-12 MED ORDER — GADOBUTROL 1 MMOL/ML IV SOLN
7.0000 mL | Freq: Once | INTRAVENOUS | Status: AC | PRN
  Administered 2019-04-12: 10 mL via INTRAVENOUS

## 2019-04-15 DIAGNOSIS — H524 Presbyopia: Secondary | ICD-10-CM | POA: Diagnosis not present

## 2019-04-15 DIAGNOSIS — H5203 Hypermetropia, bilateral: Secondary | ICD-10-CM | POA: Diagnosis not present

## 2019-06-14 DIAGNOSIS — K7581 Nonalcoholic steatohepatitis (NASH): Secondary | ICD-10-CM | POA: Diagnosis not present

## 2019-06-14 DIAGNOSIS — K7469 Other cirrhosis of liver: Secondary | ICD-10-CM | POA: Diagnosis not present

## 2019-07-08 ENCOUNTER — Ambulatory Visit (INDEPENDENT_AMBULATORY_CARE_PROVIDER_SITE_OTHER): Payer: Medicare HMO | Admitting: Internal Medicine

## 2019-07-08 ENCOUNTER — Encounter: Payer: Self-pay | Admitting: Internal Medicine

## 2019-07-08 ENCOUNTER — Other Ambulatory Visit: Payer: Self-pay

## 2019-07-08 DIAGNOSIS — E039 Hypothyroidism, unspecified: Secondary | ICD-10-CM

## 2019-07-08 DIAGNOSIS — E1165 Type 2 diabetes mellitus with hyperglycemia: Secondary | ICD-10-CM

## 2019-07-08 NOTE — Patient Instructions (Signed)
Please continue: - Metformin ER 500 mg 2x a day - Jardiance 10 mg before breakfast  - Trulicity 1.5 mg weekly  STOP insulin for now.  Please continue levothyroxine 125 mcg daily  Take the thyroid hormone every day, with water, at least 30 minutes before breakfast, separated by at least 4 hours from: - acid reflux medications - calcium - iron - multivitamins  Please stop at the lab  Please return in 3-4 months with your sugar log.

## 2019-07-08 NOTE — Progress Notes (Signed)
Patient ID: Emma Glenn, female   DOB: 26-Jun-1965, 54 y.o.   MRN: [id number]   Patient location: Home My location: Office Persons participating in the virtual visit: patient, provider  Referring Provider: Shirline Frees, MD  I connected with the patient on 07/08/19 at 10:42 AM EDT by a video enabled telemedicine application and verified that I am speaking with the correct person.   I discussed the limitations of evaluation and management by telemedicine and the availability of in person appointments. The patient expressed understanding and agreed to proceed.   Details of the encounter are shown below.  HPI: Emma Glenn is a 54 y.o.-year-old female, initially referred by her GI Dr, Dr. Hilarie Glenn, returning for follow-up for uncontrolled DM2, insulin dependent, and hypothyroidism. Last visit 4 months ago. Now on disability.  She has a URI with significant cough.  She was not tested for COVID-19 yet.  Sick contacts: Husband.   DM2: Reviewed HbA1c levels: Lab Results  Component Value Date   HGBA1C 5.2 03/10/2019   HGBA1C 5.7 (A) 11/04/2018   HGBA1C 6.5 (A) 07/17/2018  Labs from [place], drawn on 07/29/2016: HbA1c still very high, at 12.6%  Pt is on a regimen of: - Metformin ER 500 mg 2x a day - Jardiance 10 mg before breakfast - through patient assistance program - Trulicity 1.5 mg weekly - through patient assistance program - Lantus 42 >> 36 >> 28-30 >> 25 units at bedtime - but off for 1 week (05/15-21/2021) - sugars 90-136  She checks her sugars 1-2 Times a day: - am: 180-215 >> 112-156 >> 81-119, 138 >> 75-106 >> 80-117 - 2h after b'fast: n/c >> 89-188 >> 100 >> n/c - before lunch: 175 >> 254 >> n/c >> 86-109 >> n/c >> 80-91 - 2h after lunch: 160-234 >> 100-184 >> 103-131 >> 125 >> n/c - before dinner: 90-165, 187 >> 234 >> 139, 202 >> n/c >> 109-116 - 2h after dinner: 164 >> n/c >> 82-111 >> 98, 104 >> 94-106 - bedtime: n/c >> 168 >> 102, 133 >> 101 >> 90, 103 >>  -  nighttime: n/c Lowest sugar was 160 >> 89 >> 81 >> 75 >> 80; it is unclear at which level she has hypoglycemia awareness. Highest sugar was 254 >> 202 >> 138 >> 125 >> 136.  [name] meals are: - Breakfast: Cereals or 2 eggs and toast - Lunch: Skips - Dinner: Meat and veggies - Snacks: Fruit  Stopped milkshakes. Occasional sodas.  Meter: Bayer Contour  -No CKD, last BUN/creatinine:  Lab Results  Component Value Date   BUN 10 03/10/2019   BUN 11 04/02/2018   CREATININE 0.62 03/10/2019   CREATININE 0.72 04/02/2018    -+ HL; last set of lipids: Lab Results  Component Value Date   CHOL 179 03/10/2019   HDL 27.60 (L) 03/10/2019   LDLCALC 125 (H) 03/10/2019   TRIG 132.0 03/10/2019   CHOLHDL 6 03/10/2019   Restarted pravastatin 12/2016, after we consulted with her gastroenterologist, Dr. Hilarie Glenn.  LFTs remained stable after starting the statin but started to increase afterwards, being higher at last check in 08/2017.  Dr. Hilarie Glenn is aware of the above and considers these to be related to her fatty liver disease.  We continued pravastatin. Of note, she is also seen Dr. Coralyn Glenn (hepatologist) and she has liver ultrasound every 6 months.  Latest LFTs were normal: Lab Results  Component Value Date   ALT 17 03/10/2019   AST 20 03/10/2019  ALKPHOS 56 07/17/2018   BILITOT 0.5 03/10/2019   ACR was high in the past: Lab Results  Component Value Date   MICRALBCREAT 20.2 07/17/2018   MICRALBCREAT 237.7 (H) 04/02/2018   MICRALBCREAT 16.1 02/18/2017  07/29/2016: UACR was high, 148.9  - last eye exam: 03/2019: No DR -She denies numbness and tingling in her feet.  Hypothyroidism:  Pt is on levothyroxine 125 mcg daily, taken: - in am - fasting - at least 30 min from b'fast - no Ca, Fe, MVI, + PPIs at bedtime - not on Biotin  At last check, TSH was not quite in the normal range but improved.  We did not change her LT4 dose at that time: Lab Results  Component Value Date   TSH 0.33  (L) 03/10/2019   TSH 0.10 (L) 11/04/2018   TSH 0.12 (L) 07/17/2018   TSH 4.77 (H) 04/02/2018   TSH 2.65 05/21/2017   TSH 17.64 (H) 02/18/2017   TSH 4.45 06/04/2016   TSH 2.338 04/28/2015  07/29/2016: TSH 6.18.  Lab Results  Component Value Date   FREET4 1.21 03/10/2019   FREET4 1.68 (H) 11/04/2018   FREET4 1.60 07/17/2018   FREET4 1.36 05/21/2017   FREET4 0.96 02/18/2017   Reviewed previous history: She started to have severe generalized mm cramps in 03/2016.   Around that time >> RUQ pain >> HIDA scan >> GB w/o clear stones, but with sludge. She had an abd. CT scan: increased size of her liver + liver steatosis.  She was also found to have a 24-hour urine free cortisol, which returned slightly elevated: Component     Latest Ref Rng & Units 06/04/2016 06/14/2016  Cortisol (Ur), Free     4.0 - 50.0 mcg/24 h  57.0 (H)  Results received     0.63 - 2.50 g/24 h  1.27  Cortisol, Plasma     Ug/dL (collected at 5 pm) 6.8    A CT abdomen (05/2016) showed normal adrenals.  A brain MRI (04/2015) showed normal pituitary gland.  A dexamethasone suppression test was normal >> no signs of Cushing syndrome:  Component     Latest Ref Rng & Units 07/02/2016  Cortisol - AM     mcg/dL 1.8 (L)  Dexamethasone, Serum     ng/dL 385   She had a liver Bx  >> has level 3 scarring. 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  Her mother fell and had a hip fx (she also has Alzheimer's ds.) >> lives with pt >> she is more stressed.  ROS: Constitutional: no weight gain/no weight loss, no fatigue, no subjective hyperthermia, no subjective hypothermia Eyes: no blurry vision, no xerophthalmia ENT: no sore throat, no nodules palpated in neck, no dysphagia, no odynophagia, no hoarseness Cardiovascular: no CP/no SOB/no palpitations/no leg swelling Respiratory: +++ cough/no SOB/no wheezing Gastrointestinal: no N/no V/no D/no C/no acid reflux Musculoskeletal: no muscle  aches/no joint aches Skin: no rashes, no hair loss Neurological: no tremors/no numbness/no tingling/no dizziness  I reviewed pt's medications, allergies, PMH, social hx, family hx, and changes were documented in the history of present illness. Otherwise, unchanged from my initial visit note.  Past Medical History:  Diagnosis Date  . CIN I (cervical intraepithelial neoplasia I)   . COPD (chronic obstructive pulmonary disease) ([place])   . GERD (gastroesophageal reflux disease)   . Hemochromatosis   . Hepatic steatosis   . Hypertension   . Hypothyroidism   . Labial cyst  Inclusion cyst  . Liver cirrhosis ([place])   . NASH (nonalcoholic steatohepatitis)   . Positive ANA (antinuclear antibody)   . Seizures ([place])    last seizure 3/17? over dose wellbutrin  . Thyroid disease    Graves dis-Radioactive Iodine   Past Surgical History:  Procedure Laterality Date  . CERVICAL BIOPSY  W/ LOOP ELECTRODE EXCISION     Excision labial inclusion cyst  . CESAREAN SECTION    . COLPOSCOPY    . HAND SURGERY    . SHOULDER ARTHROSCOPY WITH OPEN ROTATOR CUFF REPAIR AND DISTAL CLAVICLE ACROMINECTOMY Left 06/13/2015   Procedure: LEFT SHOULDER ARTHROSCOPY, DEBRIDEMENT, OPEN ROTATOR CUFF REPAIR, CORACOID FRACTURE FIXATION, BICEPS TENODESIS;  Surgeon: Meredith Pel, MD;  Location: [facility];  Service: Orthopedics;  Laterality: Left;  . SHOULDER CLOSED REDUCTION Left 09/26/2015   Procedure: CLOSED MANIPULATION SHOULDER UNDER ANESTHESIA;  Surgeon: Meredith Pel, MD;  Location: [facility];  Service: Orthopedics;  Laterality: Left;  . TUBAL LIGATION     Social History   Social History  . Marital status: Married    Spouse name: Tommie Raymond  . Number of children: 1  . Years of education: 19   Occupational History  . Material handler    Social History Main Topics  . Smoking status: Former Smoker    Packs/day: 1.00    Types: Cigarettes    Quit date: 02/14/2015  . Smokeless tobacco: Never Used  . Alcohol use 0.0  oz/week     Comment: rare  . Drug use: No     Comment: Hx of marijuana use  . Sexual activity: Yes    Birth control/ protection: Surgical   Social History Narrative   Lives with husband and sister   Caffeine use: Tea/coffee daily      Current Outpatient Medications on File Prior to Visit  Medication Sig Dispense Refill  . BEVESPI AEROSPHERE 9-4.8 MCG/ACT AERO INHALE 2 PUFFS INTO THE LUNGS TWICE DAILY. REPLACES ANORO INHALER. 10.7 g 6  . Dulaglutide (TRULICITY) 1.5 GB/2.0FE SOPN INJECT 1.5 MLS INTO THE SKIN WEEKLY 15 pen 2  . empagliflozin (JARDIANCE) 10 MG TABS tablet Take 10 mg by mouth daily. 30 tablet 11  . glucose blood (BAYER CONTOUR TEST) test strip Use 2x a day 200 each 3  . Insulin Glargine (LANTUS SOLOSTAR) 100 UNIT/ML Solostar Pen INJECT 35 UNITS INTO THE SKIN AT BEDTIME 30 mL 3  . levothyroxine (SYNTHROID) 125 MCG tablet Take 1 tablet (125 mcg total) by mouth daily. 90 tablet 3  . losartan (COZAAR) 50 MG tablet Take 50 mg by mouth daily.    . metFORMIN (GLUCOPHAGE-XR) 500 MG 24 hr tablet Take 2 tablets (1,000 mg total) by mouth daily. 180 tablet 3  . omeprazole (PRILOSEC) 40 MG capsule Take 1 capsule (40 mg total) by mouth daily. 30 capsule 6  . pravastatin (PRAVACHOL) 20 MG tablet Take 1 tablet (20 mg total) by mouth daily. 90 tablet 3  . SURE COMFORT PEN NEEDLES 32G X 4 MM MISC USE TO INJECT LANTUS ONCE DAILY 100 each 3   No current facility-administered medications on file prior to visit.   Allergies  Allergen Reactions  . Doxycycline Other (See Comments)    Flu like symptoms (high fever, chills)  . Nitrofurantoin Monohyd Macro Other (See Comments)    Flu like symptoms (high fever, chills)  . Septra [Sulfamethoxazole-Trimethoprim] Other (See Comments)    Flu like symptoms (high fever, chills)  . Lamictal [Lamotrigine] Rash   Family History  Problem Relation Age of Onset  . Hypertension Mother   . Heart disease Mother   . Diabetes Father   . Heart disease  Father   . Heart disease Brother   . Breast cancer Maternal Grandmother   . Uterine cancer Maternal Aunt   . Seizures Neg Hx   . Stomach cancer Neg Hx   . Colon cancer Neg Hx    PE: LMP 06/23/2016  Wt Readings from Last 3 Encounters:  04/02/19 143 lb 8 oz (65.1 kg)  03/10/19 143 lb (64.9 kg)  11/04/18 153 lb (69.4 kg)   Constitutional:  in NAD  The physical exam was not performed (virtual visit).  ASSESSMENT: 1. DM2   2. Hypothyroidism  -Post ablative, after RAI treatment for Graves' disease  3. HL  PLAN: 1. DM2 -Patient with longstanding, now controlled, type 2 diabetes, on Metformin, SGLT2 inhibitor, and weekly GLP-1 receptor agonist, and long-acting insulin with now good control and an HbA1c of 5.2% at last visit.  At that time, sugars are all at goal so we decreased the dose of insulin to 36 units and I advised her to continue to decrease further if the sugars remain controlled.  We also discussed about stopping sweets (she was eating ice cream at night).  We did not change the rest of the regimen. -At this visit, sugars are excellent, and they remain like this even when she was off insulin for more than a week when she was out of town.  Therefore, for now, we can give it a try without insulin.  I advised her to let me know if that sugars increase, and at that time, we can either increase Trulicity or Jardiance or start back on the low-dose insulin. - I suggested to:  Patient Instructions  Please continue: - Metformin ER 500 mg 2x a day - Jardiance 10 mg before breakfast  - Trulicity 1.5 mg weekly  STOP insulin for now.  Please continue levothyroxine 125 mcg daily  Take the thyroid hormone every day, with water, at least 30 minutes before breakfast, separated by at least 4 hours from: - acid reflux medications - calcium - iron - multivitamins  Please stop at the lab  Please return in 3-4 months with your sugar log.   - we will check her HbA1c when she returns  to the clinic - advised to check sugars at different times of the day - 1x a day, rotating check times - advised for yearly eye exams >> she is UTD - return to clinic in 4 months      2. Hypothyroidism -Post ablative - latest thyroid labs reviewed with pt >> slightly low: Lab Results  Component Value Date   TSH 0.33 (L) 03/10/2019   - she continues on LT4 125 mcg daily.  We did not change the dose after last check due to previous variability in her TFTs - pt feels good on this dose. - we discussed about taking the thyroid hormone every day, with water, >30 minutes before breakfast, separated by >4 hours from acid reflux medications, calcium, iron, multivitamins. Pt. is taking it correctly. - will check thyroid tests when she returns to the clinic: TSH and fT4 - If labs are abnormal, she will need to return for repeat TFTs in 1.5 months  3. HL  -Reviewed latest lipid panel from 02/2019: LDL was higher, above target Lab Results  Component Value Date   CHOL 179 03/10/2019   HDL 27.60 (L) 03/10/2019   [place]  125 (H) 03/10/2019   TRIG 132.0 03/10/2019   CHOLHDL 6 03/10/2019  -Continues pravastatin without side effects  Discussed about scheduling a virtual appointment with PCP about her cough/URI.  She may need to be tested for COVID-19.  Philemon Kingdom, MD PhD [facility]

## 2019-09-28 ENCOUNTER — Telehealth: Payer: Self-pay | Admitting: Internal Medicine

## 2019-09-28 NOTE — Telephone Encounter (Signed)
I do recommend she receive the COVID 19 vaccination series

## 2019-09-28 NOTE — Telephone Encounter (Signed)
Spoke with patient, she is aware that Dr. Hilarie Fredrickson is recommending that she receive the COVID vaccination series.

## 2019-09-28 NOTE — Telephone Encounter (Signed)
Pt is requesting a call back from a nurse to ask Dr Hilarie Fredrickson if he recommends her to receive the Covid vaccine.

## 2019-10-19 ENCOUNTER — Telehealth: Payer: Self-pay | Admitting: Internal Medicine

## 2019-10-19 MED ORDER — EMPAGLIFLOZIN 10 MG PO TABS: 10.0000 mg | ORAL_TABLET | Freq: Every day | ORAL | 3 refills | Status: DC

## 2019-10-19 NOTE — Telephone Encounter (Signed)
RX sent

## 2019-10-19 NOTE — Telephone Encounter (Signed)
Medication Refill Request  Did you call your pharmacy and request this refill first? Yes-patient states she is no longer on the program that she was on and requests the following RX: . If patient has not contacted pharmacy first, instruct them to do so for future refills.  . Remind them that contacting the pharmacy for their refill is the quickest method to get the refill.  . Refill policy also stated that it will take anywhere between 24-72 hours to receive the refill.    Name of medication? empagliflozin (JARDIANCE) 10 MG TABS tablet  Is this a 90 day supply? Yes  Name and location of pharmacy? [place], [place] - [address] Phone:  [phone]  Fax:  [phone]

## 2019-10-26 ENCOUNTER — Encounter: Payer: Self-pay | Admitting: Internal Medicine

## 2019-10-26 ENCOUNTER — Other Ambulatory Visit: Payer: Self-pay

## 2019-10-26 ENCOUNTER — Telehealth (INDEPENDENT_AMBULATORY_CARE_PROVIDER_SITE_OTHER): Payer: Medicare HMO | Admitting: Internal Medicine

## 2019-10-26 DIAGNOSIS — E119 Type 2 diabetes mellitus without complications: Secondary | ICD-10-CM | POA: Diagnosis not present

## 2019-10-26 DIAGNOSIS — E1165 Type 2 diabetes mellitus with hyperglycemia: Secondary | ICD-10-CM | POA: Diagnosis not present

## 2019-10-26 DIAGNOSIS — E785 Hyperlipidemia, unspecified: Secondary | ICD-10-CM | POA: Diagnosis not present

## 2019-10-26 DIAGNOSIS — E039 Hypothyroidism, unspecified: Secondary | ICD-10-CM | POA: Diagnosis not present

## 2019-10-26 NOTE — Progress Notes (Signed)
Success Icon @ 2:22pm Invitation Sent Your invitation to [phone] has been sent. This invitation will expire in 30 minutes.

## 2019-10-26 NOTE — Progress Notes (Signed)
Patient ID: Emma Glenn, female   DOB: Nov 28, 1965, 54 y.o.   MRN: [id number]   Patient location: Home My location: Office Persons participating in the virtual visit: patient, provider  I connected with the patient on 10/26/19 at  2:39 PM EDT by a video enabled telemedicine application and verified that I am speaking with the correct person.   I discussed the limitations of evaluation and management by telemedicine and the availability of in person appointments. The patient expressed understanding and agreed to proceed.   Details of the encounter are shown below. HPI: Emma Glenn is a 54 y.o.-year-old female, initially referred by her GI Dr, Dr. Hilarie Fredrickson, returning for follow-up for uncontrolled DM2, currently insulin independent, and hypothyroidism. Last visit 4 months ago -virtual.  She converted this appointment to a virtual visit as her daughter was exposed to [phone].  Her daughter got tested and the test was negative.  However, she is symptomatic so she will be tested again.  Patient is completely asymptomatic for now.  DM2: Reviewed HbA1c levels: Lab Results  Component Value Date   HGBA1C 5.2 03/10/2019   HGBA1C 5.7 (A) 11/04/2018   HGBA1C 6.5 (A) 07/17/2018  Labs from [place], drawn on 07/29/2016: HbA1c still very high, at 12.6%  Pt is on a regimen of: - Metformin ER 500 mg 2x a day - Jardiance 10 mg before breakfast - through patient assistance program - Trulicity 1.5 mg weekly - through patient assistance program -  -stopped completely 06/2019  She checks her sugars 1-2 times a day: - am: 112-156 >> 81-119, 138 >> 75-106 >> 80-117 >> 82-122 - 2h after b'fast: n/c >> 89-188 >> 100 >> n/c - before lunch:  86-109 >> n/c >> 80-91 >> 114-120 - 2h after lunch:  100-184 >> 103-131 >> 125 >> n/c >> <141 - before dinner:  234 >> 139, 202 >> n/c >> 109-116 >> 96-120 - 2h after dinner:  82-111 >> 98, 104 >> 94-106 >> <153 - bedtime: n/c >> 168 >> 102, 133 >> 101 >> 90, 103  >>  <150 - nighttime: n/c Lowest sugar was 160 >> 89 >> 81 >> 75 >> 80 >> 82 ; it is unclear at which level she has hypoglycemia awareness. Highest sugar was 254 >> 202 >> 138 >> 125 >> 136 >> 153.  [name] meals are: - Breakfast: Cereals or 2 eggs and toast - Lunch: Skips - Dinner: Meat and veggies - Snacks: Fruit  Stopped milkshakes. Occasional sodas.  Meter: Bayer Contour  -No CKD, last BUN/creatinine:  Lab Results  Component Value Date   BUN 10 03/10/2019   BUN 11 04/02/2018   CREATININE 0.62 03/10/2019   CREATININE 0.72 04/02/2018    -+ HL; last set of lipids: Lab Results  Component Value Date   CHOL 179 03/10/2019   HDL 27.60 (L) 03/10/2019   LDLCALC 125 (H) 03/10/2019   TRIG 132.0 03/10/2019   CHOLHDL 6 03/10/2019   Restarted pravastatin 12/2016, after we consulted with her gastroenterologist, Dr. Hilarie Fredrickson.  LFTs remained stable after starting the statin but started to increase afterwards, being higher at last check in 08/2017.  Dr. Hilarie Fredrickson is aware of the above and considers these to be related to her fatty liver disease.  We will continue to pravastatin. Of note, she is also seen Dr. Coralyn Pear (hepatologist) and she has liver ultrasound every 6 months.  Latest LFTs were normal:: Lab Results  Component Value Date   ALT 17 03/10/2019   AST  20 03/10/2019   ALKPHOS 56 07/17/2018   BILITOT 0.5 03/10/2019   ACR was high but normalized: Lab Results  Component Value Date   MICRALBCREAT 20.2 07/17/2018   MICRALBCREAT 237.7 (H) 04/02/2018   MICRALBCREAT 16.1 02/18/2017  07/29/2016: UACR was high, 148.9  - last eye exam: 03/2019: No DR - no numbness and tingling in her feet.  Hypothyroidism:  Pt is on levothyroxine 125 mcg daily, taken: - in am - fasting - at least 30 min from b'fast - no Ca, Fe, MVI, + PPIs at bedtime - not on Biotin  At last check, her TSH was not in the normal range yet, but improved.  We did not change her levothyroxine dose at that time: Lab  Results  Component Value Date   TSH 0.33 (L) 03/10/2019   TSH 0.10 (L) 11/04/2018   TSH 0.12 (L) 07/17/2018   TSH 4.77 (H) 04/02/2018   TSH 2.65 05/21/2017   TSH 17.64 (H) 02/18/2017   TSH 4.45 06/04/2016   TSH 2.338 04/28/2015  07/29/2016: TSH 6.18.  Lab Results  Component Value Date   FREET4 1.21 03/10/2019   FREET4 1.68 (H) 11/04/2018   FREET4 1.60 07/17/2018   FREET4 1.36 05/21/2017   FREET4 0.96 02/18/2017   Reviewed previous history: She started to have severe generalized mm cramps in 03/2016.   Around that time >> RUQ pain >> HIDA scan >> GB w/o clear stones, but with sludge. She had an abd. CT scan: increased size of her liver + liver steatosis.  She was also found to have a 24-hour urine free cortisol, which returned slightly elevated: Component     Latest Ref Rng & Units 06/04/2016 06/14/2016  Cortisol (Ur), Free     4.0 - 50.0 mcg/24 h  57.0 (H)  Results received     0.63 - 2.50 g/24 h  1.27  Cortisol, Plasma     Ug/dL (collected at 5 pm) 6.8    A CT abdomen (05/2016) showed normal adrenals.  A brain MRI (04/2015) showed normal pituitary gland.  A dexamethasone suppression test was normal >> no signs of Cushing's syndrome:  Component     Latest Ref Rng & Units 07/02/2016  Cortisol - AM     mcg/dL 1.8 (L)  Dexamethasone, Serum     ng/dL 385   She had a liver Bx  >> has level 3 scarring. 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  Before last visit, her  mother fell and had a hip fx (she also has Alzheimer's ds.) >> lives with pt >> she is more stressed.  ROS: Constitutional: no weight gain/no weight loss, no fatigue, no subjective hyperthermia, no subjective hypothermia Eyes: no blurry vision, no xerophthalmia ENT: no sore throat, no nodules palpated in neck, no dysphagia, no odynophagia, no hoarseness Cardiovascular: no CP/no SOB/no palpitations/no leg swelling Respiratory: no cough/no SOB/no wheezing Gastrointestinal: no  N/no V/no D/no C/no acid reflux Musculoskeletal: no muscle aches/no joint aches Skin: no rashes, no hair loss Neurological: no tremors/no numbness/no tingling/no dizziness  I reviewed pt's medications, allergies, PMH, social hx, family hx, and changes were documented in the history of present illness. Otherwise, unchanged from my initial visit note.   Past Medical History:  Diagnosis Date  . CIN I (cervical intraepithelial neoplasia I)   . COPD (chronic obstructive pulmonary disease) ([place])   . GERD (gastroesophageal reflux disease)   . Hemochromatosis   . Hepatic steatosis   . Hypertension   .  Hypothyroidism   . Labial cyst    Inclusion cyst  . Liver cirrhosis ([place])   . NASH (nonalcoholic steatohepatitis)   . Positive ANA (antinuclear antibody)   . Seizures ([place])    last seizure 3/17? over dose wellbutrin  . Thyroid disease    Graves dis-Radioactive Iodine   Past Surgical History:  Procedure Laterality Date  . CERVICAL BIOPSY  W/ LOOP ELECTRODE EXCISION     Excision labial inclusion cyst  . CESAREAN SECTION    . COLPOSCOPY    . HAND SURGERY    . SHOULDER ARTHROSCOPY WITH OPEN ROTATOR CUFF REPAIR AND DISTAL CLAVICLE ACROMINECTOMY Left 06/13/2015   Procedure: LEFT SHOULDER ARTHROSCOPY, DEBRIDEMENT, OPEN ROTATOR CUFF REPAIR, CORACOID FRACTURE FIXATION, BICEPS TENODESIS;  Surgeon: Meredith Pel, MD;  Location: [facility];  Service: Orthopedics;  Laterality: Left;  . SHOULDER CLOSED REDUCTION Left 09/26/2015   Procedure: CLOSED MANIPULATION SHOULDER UNDER ANESTHESIA;  Surgeon: Meredith Pel, MD;  Location: [facility];  Service: Orthopedics;  Laterality: Left;  . TUBAL LIGATION     Social History   Social History  . Marital status: Married    Spouse name: Tommie Raymond  . Number of children: 1  . Years of education: 6   Occupational History  . Material handler    Social History Main Topics  . Smoking status: Former Smoker    Packs/day: 1.00    Types: Cigarettes    Quit date:  02/14/2015  . Smokeless tobacco: Never Used  . Alcohol use 0.0 oz/week     Comment: rare  . Drug use: No     Comment: Hx of marijuana use  . Sexual activity: Yes    Birth control/ protection: Surgical   Social History Narrative   Lives with husband and sister   Caffeine use: Tea/coffee daily      Current Outpatient Medications on File Prior to Visit  Medication Sig Dispense Refill  . BEVESPI AEROSPHERE 9-4.8 MCG/ACT AERO INHALE 2 PUFFS INTO THE LUNGS TWICE DAILY. REPLACES ANORO INHALER. 10.7 g 6  . Dulaglutide (TRULICITY) 1.5 WH/6.7RF SOPN INJECT 1.5 MLS INTO THE SKIN WEEKLY 15 pen 2  . empagliflozin (JARDIANCE) 10 MG TABS tablet Take 1 tablet (10 mg total) by mouth daily. 90 tablet 3  . glucose blood (BAYER CONTOUR TEST) test strip Use 2x a day 200 each 3  . levothyroxine (SYNTHROID) 125 MCG tablet Take 1 tablet (125 mcg total) by mouth daily. 90 tablet 3  . losartan (COZAAR) 50 MG tablet Take 50 mg by mouth daily.    . metFORMIN (GLUCOPHAGE-XR) 500 MG 24 hr tablet Take 2 tablets (1,000 mg total) by mouth daily. 180 tablet 3  . omeprazole (PRILOSEC) 40 MG capsule Take 1 capsule (40 mg total) by mouth daily. 30 capsule 6  . pravastatin (PRAVACHOL) 20 MG tablet Take 1 tablet (20 mg total) by mouth daily. 90 tablet 3  . SURE COMFORT PEN NEEDLES 32G X 4 MM MISC USE TO INJECT LANTUS ONCE DAILY 100 each 3   No current facility-administered medications on file prior to visit.   Allergies  Allergen Reactions  . Doxycycline Other (See Comments)    Flu like symptoms (high fever, chills)  . Nitrofurantoin Monohyd Macro Other (See Comments)    Flu like symptoms (high fever, chills)  . Septra [Sulfamethoxazole-Trimethoprim] Other (See Comments)    Flu like symptoms (high fever, chills)  . Lamictal [Lamotrigine] Rash   Family History  Problem Relation Age of Onset  . Hypertension Mother   .  Heart disease Mother   . Diabetes Father   . Heart disease Father   . Heart disease Brother   .  Breast cancer Maternal Grandmother   . Uterine cancer Maternal Aunt   . Seizures Neg Hx   . Stomach cancer Neg Hx   . Colon cancer Neg Hx    PE: LMP 06/23/2016  Wt Readings from Last 3 Encounters:  04/02/19 143 lb 8 oz (65.1 kg)  03/10/19 143 lb (64.9 kg)  11/04/18 153 lb (69.4 kg)   Constitutional:  in NAD  The physical exam was not performed (virtual visit).  ASSESSMENT: 1. DM2   2. Hypothyroidism  -Post ablative, after RAI treatment for Graves' disease  3. HL  PLAN: 1. DM2 -Patient with longstanding, uncontrolled type 2 diabetes, on oral medication regimen with Metformin, SGLT2 inhibitor, and also weekly GLP-1 receptor agonist and now off insulin since last visit due to good control.  At that time, she was off insulin for a week when she was out of town and sugars remain well controlled so I advised her to continue off the insulin.  Overall, sugars were excellent.  We could not check an HbA1c at that time since this was a virtual appointment but the previous HbA1c was 5.2%. -At this visit, sugars are still at goal, despite coming off insulin and using submaximal doses of all of her medications. -We discussed again about continuing to improve her diet, as she is already doing.  She is drinking sodas very rarely. -No other changes are needed in her regimen for now - I suggested to:  Patient Instructions  Please continue: - Metformin ER 500 mg 2x a day - Jardiance 10 mg before breakfast  - Trulicity 1.5 mg weekly  Please continue levothyroxine 125 mcg daily  Take the thyroid hormone every day, with water, at least 30 minutes before breakfast, separated by at least 4 hours from: - acid reflux medications - calcium - iron - multivitamins  Please return in 3-4 months with your sugar log.   - we would check her HbA1c when she returns to the clinic - advised to check sugars at different times of the day - 1-2x a day, rotating check times - advised for yearly eye exams >>  she is UTD - return to clinic in 3-4 months      2. Hypothyroidism -Post ablative - latest thyroid labs reviewed with pt >> slightly low: Lab Results  Component Value Date   TSH 0.33 (L) 03/10/2019   - she continues on LT4 125 mcg daily -we did not change the dose after last check due to previous variability in her TFTs.  However, we could not check her TSH afterwards since her last appointment was done virtually. - pt feels good on this dose. - we discussed about taking the thyroid hormone every day, with water, >30 minutes before breakfast, separated by >4 hours from acid reflux medications, calcium, iron, multivitamins. Pt. is taking it correctly. - will check thyroid tests when she returns to the clinic: TSH and fT4 - If labs are abnormal, she will need to return for repeat TFTs in 1.5 months  3. HL  -Reviewed latest lipid panel from 02/2019: LDL higher, above target, HDL low: Lab Results  Component Value Date   CHOL 179 03/10/2019   HDL 27.60 (L) 03/10/2019   LDLCALC 125 (H) 03/10/2019   TRIG 132.0 03/10/2019   CHOLHDL 6 03/10/2019  -Continues pravastatin without side effects   Philemon Kingdom, MD  PhD [facility]

## 2019-10-26 NOTE — Patient Instructions (Signed)
Please continue: - Metformin ER 500 mg 2x a day - Jardiance 10 mg before breakfast  - Trulicity 1.5 mg weekly  Please continue levothyroxine 125 mcg daily  Take the thyroid hormone every day, with water, at least 30 minutes before breakfast, separated by at least 4 hours from: - acid reflux medications - calcium - iron - multivitamins  Please return in 3-4 months with your sugar log.

## 2019-11-18 ENCOUNTER — Other Ambulatory Visit: Payer: Self-pay | Admitting: Internal Medicine

## 2020-04-19 ENCOUNTER — Ambulatory Visit: Payer: Medicare HMO | Admitting: Internal Medicine

## 2020-04-19 ENCOUNTER — Encounter: Payer: Self-pay | Admitting: Internal Medicine

## 2020-04-19 ENCOUNTER — Other Ambulatory Visit: Payer: Self-pay

## 2020-04-19 VITALS — BP 140/88 | HR 83 | Ht 62.0 in | Wt 147.8 lb

## 2020-04-19 DIAGNOSIS — E039 Hypothyroidism, unspecified: Secondary | ICD-10-CM | POA: Diagnosis not present

## 2020-04-19 DIAGNOSIS — J449 Chronic obstructive pulmonary disease, unspecified: Secondary | ICD-10-CM | POA: Diagnosis not present

## 2020-04-19 DIAGNOSIS — E119 Type 2 diabetes mellitus without complications: Secondary | ICD-10-CM | POA: Diagnosis not present

## 2020-04-19 DIAGNOSIS — E785 Hyperlipidemia, unspecified: Secondary | ICD-10-CM

## 2020-04-19 DIAGNOSIS — E1165 Type 2 diabetes mellitus with hyperglycemia: Secondary | ICD-10-CM | POA: Diagnosis not present

## 2020-04-19 LAB — COMPREHENSIVE METABOLIC PANEL
ALT: 25 U/L (ref 0–35)
AST: 26 U/L (ref 0–37)
Albumin: 4.3 g/dL (ref 3.5–5.2)
Alkaline Phosphatase: 60 U/L (ref 39–117)
BUN: 9 mg/dL (ref 6–23)
CO2: 27 mEq/L (ref 19–32)
Calcium: 9.7 mg/dL (ref 8.4–10.5)
Chloride: 100 mEq/L (ref 96–112)
Creatinine, Ser: 0.78 mg/dL (ref 0.40–1.20)
GFR: 85.01 mL/min (ref >60.00)
Glucose, Bld: 107 mg/dL — ABNORMAL HIGH (ref 70–99)
Potassium: 4.4 mEq/L (ref 3.5–5.1)
Sodium: 137 mEq/L (ref 135–145)
Total Bilirubin: 0.7 mg/dL (ref 0.2–1.2)
Total Protein: 8.5 g/dL — ABNORMAL HIGH (ref 6.0–8.3)

## 2020-04-19 LAB — POCT GLYCOSYLATED HEMOGLOBIN (HGB A1C): Hemoglobin A1C: 6 % — AB (ref 4.0–5.6)

## 2020-04-19 LAB — MICROALBUMIN / CREATININE URINE RATIO
Creatinine,U: 105 mg/dL
Microalb Creat Ratio: 44.9 mg/g — ABNORMAL HIGH (ref 0.0–30.0)
Microalb, Ur: 47.1 mg/dL — ABNORMAL HIGH (ref 0.0–1.9)

## 2020-04-19 LAB — LIPID PANEL
Cholesterol: 217 mg/dL — ABNORMAL HIGH (ref 0–200)
HDL: 29.8 mg/dL — ABNORMAL LOW (ref >39.00)
LDL Cholesterol: 154 mg/dL — ABNORMAL HIGH (ref 0–99)
NonHDL: 187.3
Total CHOL/HDL Ratio: 7
Triglycerides: 167 mg/dL — ABNORMAL HIGH (ref 0.0–149.0)
VLDL: 33.4 mg/dL (ref 0.0–40.0)

## 2020-04-19 MED ORDER — METFORMIN HCL ER 500 MG PO TB24
1000.0000 mg | ORAL_TABLET | Freq: Every day | ORAL | 3 refills | Status: DC
Start: 2020-04-19 — End: 2021-03-09

## 2020-04-19 MED ORDER — EMPAGLIFLOZIN 10 MG PO TABS
10.0000 mg | ORAL_TABLET | Freq: Every day | ORAL | 3 refills | Status: DC
Start: 2020-04-19 — End: 2021-04-19

## 2020-04-19 NOTE — Patient Instructions (Signed)
Please continue: - Metformin ER 500 mg 2x a day - take consistently - Jardiance 10 mg before breakfast   Please continue levothyroxine 125 mcg daily  Take the thyroid hormone every day, with water, at least 30 minutes before breakfast, separated by at least 4 hours from: - acid reflux medications - calcium - iron - multivitamins  Please stop at the lab.  Please return in 4-6 months with your sugar log.

## 2020-04-19 NOTE — Progress Notes (Signed)
Patient ID: Emma Glenn, female   DOB: [birth year], 55 y.o.   MRN: [id number]   This visit occurred during the SARS-CoV-2 public health emergency.  Safety protocols were in place, including screening questions prior to the visit, additional usage of staff PPE, and extensive cleaning of exam room while observing appropriate contact time as indicated for disinfecting solutions.   HPI: Emma Glenn is a 55 y.o.-year-old female, initially referred by her GI Dr, Dr. Hilarie Fredrickson, returning for follow-up for uncontrolled DM2, currently insulin independent, and hypothyroidism. Last visit 6 months ago-virtual.  Since last visit, she has been very busy with her mom having Alzheimer's disease. Also, since last visit, her daughter and her teenage son moved in with the patient in 11/2019. She had Covid19 03/2020 - no symptoms. Due to the above, she has missed many medication doses.  DM2: Reviewed HbA1c levels: Lab Results  Component Value Date   HGBA1C 5.2 03/10/2019   HGBA1C 5.7 (A) 11/04/2018   HGBA1C 6.5 (A) 07/17/2018  Labs from [facility], drawn on 07/29/2016: HbA1c still very high, at 12.6%  Pt is on a regimen of: - Metformin ER 500 mg 2x a day >> missing many doses - Jardiance 10 mg before breakfast - through patient assistance program >> takes consistently - Trulicity 1.5 mg weekly - through patient assistance program >> ran out 11/2019 -  -stop completely 06/2019  She checks her sugars 1-2 times a day: - am: 81-119, 138 >> 75-106 >> 80-117 >> 82-122 >> 115-128 - 2h after b'fast: n/c >> 89-188 >> 100 >> n/c - before lunch:  86-109 >> n/c >> 80-91 >> 114-120 >> 115 - 2h after lunch:  103-131 >> 125 >> n/c >> <141 >> n/c - before dinner: 139, 202 >> n/c >> 109-116 >> 96-120 >> n/c - 2h after dinner:  82-111 >> 98, 104 >> 94-106 >> <153 >> n/c - bedtime: 102, 133 >> 101 >> 90, 103  >> <150 >> 123 - nighttime: n/c Lowest sugar was 80 >> 82 >> 115; it is unclear at which level she has hypoglycemia  awareness: Highest sugar was 136 >> 153 >> 128.  [name] meals are: - Breakfast: Cereals or 2 eggs and toast - Lunch: Skips - Dinner: Meat and veggies - Snacks: Fruit  Stopped milkshakes. Occasional sodas.  Meter: Bayer Contour  -No CKD, last BUN/creatinine:  Lab Results  Component Value Date   BUN 10 03/10/2019   BUN 11 04/02/2018   CREATININE 0.62 03/10/2019   CREATININE 0.72 04/02/2018    -+ HL; last set of lipids: Lab Results  Component Value Date   CHOL 179 03/10/2019   HDL 27.60 (L) 03/10/2019   LDLCALC 125 (H) 03/10/2019   TRIG 132.0 03/10/2019   CHOLHDL 6 03/10/2019   We restarted pravastatin 12/2016, after we consulted with her gastroenterologist, Dr. Hilarie Fredrickson.  LFTs remained stable after starting the statin but started to increase afterwards, being higher at last check in 08/2017.  Dr. Hilarie Fredrickson is aware of the above and considers these to be related to her fatty liver disease.  We continued her pravastatin.  She tells me that she is taking this very consistently, though. Of note, she is also seen Dr. Coralyn Pear (hepatologist) and she has liver ultrasound every 6 months.  Latest LFTs were normal: Lab Results  Component Value Date   ALT 17 03/10/2019   AST 20 03/10/2019   ALKPHOS 56 07/17/2018   BILITOT 0.5 03/10/2019   ACR was high but normalized:  Lab Results  Component Value Date   MICRALBCREAT 20.2 07/17/2018   MICRALBCREAT 237.7 (H) 04/02/2018   MICRALBCREAT 16.1 02/18/2017  07/29/2016: UACR was high, 148.9  - last eye exam: 03/2019: No DR -No numbness and tingling in her feet.  Hypothyroidism:  Pt is on levothyroxine 125 mcg daily, taken: - in am - fasting - at least 30 min from b'fast - no calcium - no iron - no multivitamins - + PPIs at bedtime - occas. - not on Biotin  At last check, TSH was still low, but improved.  We did not change her levothyroxine dose at that time: Lab Results  Component Value Date   TSH 0.33 (L) 03/10/2019   TSH 0.10 (L)  11/04/2018   TSH 0.12 (L) 07/17/2018   TSH 4.77 (H) 04/02/2018   TSH 2.65 05/21/2017   TSH 17.64 (H) 02/18/2017   TSH 4.45 06/04/2016   TSH 2.338 04/28/2015  07/29/2016: TSH 6.18.  Lab Results  Component Value Date   FREET4 1.21 03/10/2019   FREET4 1.68 (H) 11/04/2018   FREET4 1.60 07/17/2018   FREET4 1.36 05/21/2017   FREET4 0.96 02/18/2017   Reviewed previous history: She started to have severe generalized mm cramps in 03/2016.   Around that time >> RUQ pain >> HIDA scan >> GB w/o clear stones, but with sludge. She had an abd. CT scan: increased size of her liver + liver steatosis.  She was also found to have a 24-hour urine free cortisol, which returned slightly elevated: Component     Latest Ref Rng & Units 06/04/2016 06/14/2016  Cortisol (Ur), Free     4.0 - 50.0 mcg/24 h  57.0 (H)  Results received     0.63 - 2.50 g/24 h  1.27  Cortisol, Plasma     Ug/dL (collected at 5 pm) 6.8    A CT abdomen (05/2016) showed normal adrenals.  A brain MRI (04/2015) showed normal pituitary gland.  A dexamethasone suppression test was normal >> no signs of Cushing's syndrome:  Component     Latest Ref Rng & Units 07/02/2016  Cortisol - AM     mcg/dL 1.8 (L)  Dexamethasone, Serum     ng/dL 385   She had a liver Bx  >> has level 3 scarring. 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  Before last visit, her  mother fell and had a hip fx (she also has Alzheimer's ds.) >> lives with pt >> she is more stressed.  ROS: Constitutional: + weight gain/no weight loss, no fatigue, no subjective hyperthermia, no subjective hypothermia Eyes: no blurry vision, no xerophthalmia ENT: no sore throat, no nodules palpated in neck, no dysphagia, no odynophagia, no hoarseness Cardiovascular: no CP/no SOB/no palpitations/no leg swelling Respiratory: no cough/no SOB/no wheezing Gastrointestinal: no N/no V/no D/no C/no acid reflux Musculoskeletal: no muscle aches/no joint  aches Skin: no rashes, no hair loss Neurological: no tremors/no numbness/no tingling/no dizziness  I reviewed pt's medications, allergies, PMH, social hx, family hx, and changes were documented in the history of present illness. Otherwise, unchanged from my initial visit note.  Past Medical History:  Diagnosis Date  . CIN I (cervical intraepithelial neoplasia I)   . COPD (chronic obstructive pulmonary disease) ([place])   . GERD (gastroesophageal reflux disease)   . Hemochromatosis   . Hepatic steatosis   . Hypertension   . Hypothyroidism   . Labial cyst    Inclusion cyst  . Liver cirrhosis ([place])   .  NASH (nonalcoholic steatohepatitis)   . Positive ANA (antinuclear antibody)   . Seizures ([place])    last seizure 3/17? over dose wellbutrin  . Thyroid disease    Graves dis-Radioactive Iodine   Past Surgical History:  Procedure Laterality Date  . CERVICAL BIOPSY  W/ LOOP ELECTRODE EXCISION     Excision labial inclusion cyst  . CESAREAN SECTION    . COLPOSCOPY    . HAND SURGERY    . SHOULDER ARTHROSCOPY WITH OPEN ROTATOR CUFF REPAIR AND DISTAL CLAVICLE ACROMINECTOMY Left 06/13/2015   Procedure: LEFT SHOULDER ARTHROSCOPY, DEBRIDEMENT, OPEN ROTATOR CUFF REPAIR, CORACOID FRACTURE FIXATION, BICEPS TENODESIS;  Surgeon: Meredith Pel, MD;  Location: [facility];  Service: Orthopedics;  Laterality: Left;  . SHOULDER CLOSED REDUCTION Left 09/26/2015   Procedure: CLOSED MANIPULATION SHOULDER UNDER ANESTHESIA;  Surgeon: Meredith Pel, MD;  Location: [facility];  Service: Orthopedics;  Laterality: Left;  . TUBAL LIGATION     Social History   Social History  . Marital status: Married    Spouse name: Tommie Raymond  . Number of children: 1  . Years of education: 93   Occupational History  . Material handler    Social History Main Topics  . Smoking status: Former Smoker    Packs/day: 1.00    Types: Cigarettes    Quit date: 02/14/2015  . Smokeless tobacco: Never Used  . Alcohol use 0.0 oz/week      Comment: rare  . Drug use: No     Comment: Hx of marijuana use  . Sexual activity: Yes    Birth control/ protection: Surgical   Social History Narrative   Lives with husband and sister   Caffeine use: Tea/coffee daily      Current Outpatient Medications on File Prior to Visit  Medication Sig Dispense Refill  . BEVESPI AEROSPHERE 9-4.8 MCG/ACT AERO INHALE 2 PUFFS INTO THE LUNGS TWICE DAILY. REPLACES ANORO INHALER. 10.7 g 6  . Dulaglutide (TRULICITY) 1.5 NO/1.7RN SOPN INJECT 1.5 MLS INTO THE SKIN WEEKLY 15 pen 2  . empagliflozin (JARDIANCE) 10 MG TABS tablet Take 1 tablet (10 mg total) by mouth daily. 90 tablet 3  . glucose blood (BAYER CONTOUR TEST) test strip Use 2x a day 200 each 3  . levothyroxine (SYNTHROID) 125 MCG tablet Take 1 tablet (125 mcg total) by mouth daily. 90 tablet 3  . losartan (COZAAR) 50 MG tablet Take 50 mg by mouth daily.    . metFORMIN (GLUCOPHAGE-XR) 500 MG 24 hr tablet TAKE TWO TABLETS BY MOUTH EVERY DAY 180 tablet 3  . omeprazole (PRILOSEC) 40 MG capsule Take 1 capsule (40 mg total) by mouth daily. 30 capsule 6  . pravastatin (PRAVACHOL) 20 MG tablet Take 1 tablet (20 mg total) by mouth daily. 90 tablet 3  . SURE COMFORT PEN NEEDLES 32G X 4 MM MISC USE TO INJECT LANTUS ONCE DAILY 100 each 3   No current facility-administered medications on file prior to visit.   Allergies  Allergen Reactions  . Doxycycline Other (See Comments)    Flu like symptoms (high fever, chills)  . Nitrofurantoin Monohyd Macro Other (See Comments)    Flu like symptoms (high fever, chills)  . Septra [Sulfamethoxazole-Trimethoprim] Other (See Comments)    Flu like symptoms (high fever, chills)  . Lamictal [Lamotrigine] Rash   Family History  Problem Relation Age of Onset  . Hypertension Mother   . Heart disease Mother   . Diabetes Father   . Heart disease Father   . Heart  disease Brother   . Breast cancer Maternal Grandmother   . Uterine cancer Maternal Aunt   . Seizures Neg  Hx   . Stomach cancer Neg Hx   . Colon cancer Neg Hx    PE: BP 140/88 (BP Location: Right Arm, Patient Position: Sitting, Cuff Size: Normal)   Pulse 83   Ht 5' 2"  (1.575 m)   Wt 147 lb 12.8 oz (67 kg)   LMP 06/23/2016   SpO2 95%   BMI 27.03 kg/m  Wt Readings from Last 3 Encounters:  04/19/20 147 lb 12.8 oz (67 kg)  04/02/19 143 lb 8 oz (65.1 kg)  03/10/19 143 lb (64.9 kg)   Constitutional: normal weight, in NAD Eyes: PERRLA, EOMI, no exophthalmos ENT: moist mucous membranes, no thyromegaly, no cervical lymphadenopathy Cardiovascular: RRR, No MRG Respiratory: CTA B Gastrointestinal: abdomen soft, NT, ND, BS+ Musculoskeletal: no deformities, strength intact in all 4 Skin: moist, warm, no rashes Neurological: no tremor with outstretched hands, DTR normal in all 4   ASSESSMENT: 1. DM2   2. Hypothyroidism  -Post ablative, after RAI treatment for Graves' disease  3. HL  PLAN: 1. DM2 -Patient with longstanding, uncontrolled, type 2 diabetes, on oral medications regimen with Metformin, SGLT2 inhibitor and also weekly GLP-1 receptor agonist and now off insulin since 06/2019 due to good control.  Our last visit was 6 months ago.  This was a virtual visit and we could not check an HbA1c at that time.  We continued the above regimen without changes.  I encouraged her to continue with her diet.  She was drinking sodas very rarely at that time.  Latest HbA1c available for review was 5.2% in 02/2019. -At today's visit, she comes up to have a longer absence and she tells me she has missed many medication doses.  She actually came off Trulicity completely out. she is missing many Metformin doses.  She takes Jardiance consistently.  We reviewed together her blood sugar log.  Sugars are still at goal in the morning and she is not frequently checking during the day, but whenever she checked, they were at goal.  I advised her to try to rotate the blood sugar checks more and to try to take the  Metformin consistently.  For now, we can continue off Trulicity and just follow her blood sugars, but I would have a low threshold of restarting this especially since it can also help with fatty liver and her weight. - I suggested to:  Patient Instructions  Please continue: - Metformin ER 500 mg 2x a day - take consistently - Jardiance 10 mg before breakfast   Please continue levothyroxine 125 mcg daily  Take the thyroid hormone every day, with water, at least 30 minutes before breakfast, separated by at least 4 hours from: - acid reflux medications - calcium - iron - multivitamins  Please stop at the lab.  Please return in 4-6 months with your sugar log.   - we checked her HbA1c: 6.0% (slightly higher) - advised to check sugars at different times of the day - 1x a day, rotating check times - advised for yearly eye exams >> she is UTD - return to clinic in 4-6 months      2. Hypothyroidism -Post RAI ablation - latest thyroid labs reviewed with pt >> TSH slightly low: Lab Results  Component Value Date   TSH 0.33 (L) 03/10/2019   - she continues on LT4 125 mcg daily -we did not change her levothyroxine dose  at that time due to previous variability and she did not come to the clinic afterwards to be able to recheck the TSH - pt feels good on this dose. - we discussed about taking the thyroid hormone every day, with water, >30 minutes before breakfast, separated by >4 hours from acid reflux medications, calcium, iron, multivitamins. Pt. is taking it correctly. - will check thyroid tests today: TSH and fT4 - If labs are abnormal, she will need to return for repeat TFTs in 1.5 months  3. HL  -Reviewed latest lipid panel from 02/2019: LDL higher, above target, HDL low: Lab Results  Component Value Date   CHOL 179 03/10/2019   HDL 27.60 (L) 03/10/2019   LDLCALC 125 (H) 03/10/2019   TRIG 132.0 03/10/2019   CHOLHDL 6 03/10/2019  -Continues pravastatin 20 mg daily without side  effects, but forgets many doses >> strongly advised her to restart taking consistently  Component     Latest Ref Rng & Units 04/19/2020  Sodium     135 - 145 mEq/L 137  Potassium     3.5 - 5.1 mEq/L 4.4  Chloride     96 - 112 mEq/L 100  CO2     19 - 32 mEq/L 27  Glucose     70 - 99 mg/dL 107 (H)  BUN     6 - 23 mg/dL 9  Creatinine     0.40 - 1.20 mg/dL 0.78  Total Bilirubin     0.2 - 1.2 mg/dL 0.7  Alkaline Phosphatase     39 - 117 U/L 60  AST     0 - 37 U/L 26  ALT     0 - 35 U/L 25  Total Protein     6.0 - 8.3 g/dL 8.5 (H)  Albumin     3.5 - 5.2 g/dL 4.3  Calcium     8.4 - 10.5 mg/dL 9.7  GFR     >60.00 mL/min 85.01  Cholesterol     0 - 200 mg/dL 217 (H)  Triglycerides     0.0 - 149.0 mg/dL 167.0 (H)  HDL Cholesterol     >39.00 mg/dL 29.80 (L)  VLDL     0.0 - 40.0 mg/dL 33.4  LDL (calc)     0 - 99 mg/dL 154 (H)  Total CHOL/HDL Ratio      7  NonHDL      187.30  Microalb, Ur     0.0 - 1.9 mg/dL 47.1 (H)  Creatinine,U     mg/dL 105.0  MICROALB/CREAT RATIO     0.0 - 30.0 mg/g 44.9 (H)  TSH     0.35 - 4.50 uIU/mL 1.78  T4,Free(Direct)     0.60 - 1.60 ng/dL 1.41   TFTs are normal. LDL and triglycerides are elevated - we discussed at the time of the visit that she needs to take pravastatin consistently. Total proteins are slightly elevated - reviewing her chart, this is not new for her. ACR is elevated -  I will check to see if she is taking Cozaar consistently.  Philemon Kingdom, MD PhD [facility] Endocrinology

## 2020-04-20 LAB — T4, FREE: Free T4: 1.41 ng/dL (ref 0.60–1.60)

## 2020-04-20 LAB — TSH: TSH: 1.78 u[IU]/mL (ref 0.35–4.50)

## 2020-04-27 ENCOUNTER — Telehealth: Payer: Self-pay | Admitting: Internal Medicine

## 2020-04-27 DIAGNOSIS — E039 Hypothyroidism, unspecified: Secondary | ICD-10-CM

## 2020-04-27 DIAGNOSIS — E1165 Type 2 diabetes mellitus with hyperglycemia: Secondary | ICD-10-CM

## 2020-04-27 NOTE — Telephone Encounter (Signed)
MEDICATION: levothyroxine  PHARMACY:   [place], [place] Phone:  [phone]  Fax:  [phone]      HAS THE PATIENT CONTACTED THEIR PHARMACY?  yes  IS THIS A 90 DAY SUPPLY : yes  IS PATIENT OUT OF MEDICATION: pharmacy didn't know  IF NOT; HOW MUCH IS LEFT:   LAST APPOINTMENT DATE: @3 /10/2020  NEXT APPOINTMENT DATE:@9 /13/2022  DO WE HAVE YOUR PERMISSION TO LEAVE A DETAILED MESSAGE?:  OTHER COMMENTS:    **Let patient know to contact pharmacy at the end of the day to make sure medication is ready. **  ** Please notify patient to allow 48-72 hours to process**  **Encourage patient to contact the pharmacy for refills or they can request refills through [facility]**

## 2020-04-29 MED ORDER — LEVOTHYROXINE SODIUM 125 MCG PO TABS: 125.0000 ug | ORAL_TABLET | Freq: Every day | ORAL | 3 refills | Status: DC

## 2020-04-29 NOTE — Telephone Encounter (Signed)
Rx sent to preferred pharmacy.

## 2020-06-12 DIAGNOSIS — K7581 Nonalcoholic steatohepatitis (NASH): Secondary | ICD-10-CM | POA: Diagnosis not present

## 2020-06-12 DIAGNOSIS — K7469 Other cirrhosis of liver: Secondary | ICD-10-CM | POA: Diagnosis not present

## 2020-06-12 DIAGNOSIS — K7460 Unspecified cirrhosis of liver: Secondary | ICD-10-CM | POA: Diagnosis not present

## 2020-10-24 ENCOUNTER — Ambulatory Visit (INDEPENDENT_AMBULATORY_CARE_PROVIDER_SITE_OTHER): Payer: Medicare HMO | Admitting: Internal Medicine

## 2020-10-24 ENCOUNTER — Encounter: Payer: Self-pay | Admitting: Internal Medicine

## 2020-10-24 ENCOUNTER — Other Ambulatory Visit: Payer: Self-pay

## 2020-10-24 VITALS — BP 140/100 | HR 79 | Ht 62.0 in | Wt 145.0 lb

## 2020-10-24 DIAGNOSIS — E039 Hypothyroidism, unspecified: Secondary | ICD-10-CM | POA: Diagnosis not present

## 2020-10-24 DIAGNOSIS — E785 Hyperlipidemia, unspecified: Secondary | ICD-10-CM | POA: Diagnosis not present

## 2020-10-24 DIAGNOSIS — Z23 Encounter for immunization: Secondary | ICD-10-CM

## 2020-10-24 DIAGNOSIS — E1165 Type 2 diabetes mellitus with hyperglycemia: Secondary | ICD-10-CM | POA: Diagnosis not present

## 2020-10-24 LAB — POCT GLYCOSYLATED HEMOGLOBIN (HGB A1C): Hemoglobin A1C: 6.3 % — AB (ref 4.0–5.6)

## 2020-10-24 MED ORDER — LOSARTAN POTASSIUM 50 MG PO TABS: 50.0000 mg | ORAL_TABLET | Freq: Every day | ORAL | 3 refills | Status: DC

## 2020-10-24 MED ORDER — PRAVASTATIN SODIUM 20 MG PO TABS: 20.0000 mg | ORAL_TABLET | Freq: Every day | ORAL | 3 refills | Status: DC

## 2020-10-24 NOTE — Progress Notes (Signed)
Patient ID: Emma Glenn, female   DOB: [birth year], 55 y.o.   MRN: [id number]   This visit occurred during the SARS-CoV-2 public health emergency.  Safety protocols were in place, including screening questions prior to the visit, additional usage of staff PPE, and extensive cleaning of exam room while observing appropriate contact time as indicated for disinfecting solutions.   HPI: Emma Glenn is a 55 y.o.-year-old female, initially referred by her GI Dr, Dr. Hilarie Fredrickson, returning for follow-up for uncontrolled DM2, currently insulin independent, and hypothyroidism. Last visit 6 months ago.  Interim history: Before last visit, she has been very busy with her mother with Alzheimer's disease and also her daughter and her teenage son moved in with the patient in 11/2019.  Because of this, she missed many metformin doses. Since then her daughter moved out.  She still misses many Metformin doses.   DM2: Reviewed HbA1c levels: Lab Results  Component Value Date   HGBA1C 6.0 (A) 04/19/2020   HGBA1C 5.2 03/10/2019   HGBA1C 5.7 (A) 11/04/2018  Labs from [employer], drawn on 07/29/2016: HbA1c still very high, at 12.6%  Pt is on a regimen of: - Metformin ER 500 mg 2x a day >> missing many doses - Jardiance 10 mg before breakfast - through patient assistance program >> takes consistently -  - through patient assistance program >> ran out 11/2019 -  -stop completely 06/2019  She checks her sugars 1-2 times a day: - am:  75-106 >> 80-117 >> 82-122 >> 115-128 >> 94-127 - 2h after b'fast: n/c >> 89-188 >> 100 >> n/c >> 125, 136 - before lunch:  n/c >> 80-91 >> 114-120 >> 115 >> 91 - 2h after lunch:  125 >> n/c >> <141 >> n/c >> 166, 169 - before dinner:  109-116 >> 96-120 >> n/c >> 71, 87 - 2h after dinner:  98, 104 >> 94-106 >> <153 >> n/c >> 162 - bedtime: 102, 133 >> 101 >> 90, 103  >> <150 >> 123 >> n/c - nighttime: n/c Lowest sugar was 80 >> 82 >> 115 >> 71; it is unclear at which level she has  hypoglycemia awareness: Highest sugar was 136 >> 153 >> 128 >> 169.  [name] meals are: - Breakfast: Cereals or 2 eggs and toast - Lunch: Skips - Dinner: Meat and veggies - Snacks: Fruit  Stopped milkshakes. Occasional sodas.  Meter: Bayer Contour  -No CKD, last BUN/creatinine:  Lab Results  Component Value Date   BUN 9 04/19/2020   BUN 10 03/10/2019   CREATININE 0.78 04/19/2020   CREATININE 0.62 03/10/2019    -+ HL; last set of lipids: Lab Results  Component Value Date   CHOL 217 (H) 04/19/2020   HDL 29.80 (L) 04/19/2020   LDLCALC 154 (H) 04/19/2020   TRIG 167.0 (H) 04/19/2020   CHOLHDL 7 04/19/2020   We restarted pravastatin 12/2016, after we consulted with her gastroenterologist, Dr. Hilarie Fredrickson.  LFTs remained stable after starting the statin but started to increase afterwards, being higher at last check in 08/2017.  Dr. Hilarie Fredrickson is aware of the above and considers these to be related to her fatty liver disease.  We continued her pravastatin >> ran out. Of note, she is also seen Dr. Coralyn Pear (hepatologist) and she has liver ultrasound every 6 months.  Latest LFTs were normal at last check: Lab Results  Component Value Date   ALT 25 04/19/2020   AST 26 04/19/2020   ALKPHOS 60 04/19/2020   BILITOT 0.7  04/19/2020   ACR is fluctuating: Lab Results  Component Value Date   MICRALBCREAT 44.9 (H) 04/19/2020   MICRALBCREAT 20.2 07/17/2018   MICRALBCREAT 237.7 (H) 04/02/2018   MICRALBCREAT 16.1 02/18/2017  07/29/2016: UACR was high, 148.9 Off Cozaar after she ran out.  - last eye exam: 03/2019: No DR  -No numbness and tingling in her feet.  Hypothyroidism:  Pt is on levothyroxine 125 mcg daily, taken: - in am - fasting - at least 30 min from b'fast - no calcium - no iron - no multivitamins - + PPIs at bedtime - occas. - not on Biotin  Reviewed her TFTs: Lab Results  Component Value Date   TSH 1.78 04/19/2020   TSH 0.33 (L) 03/10/2019   TSH 0.10 (L) 11/04/2018    TSH 0.12 (L) 07/17/2018   TSH 4.77 (H) 04/02/2018   TSH 2.65 05/21/2017   TSH 17.64 (H) 02/18/2017   TSH 4.45 06/04/2016   TSH 2.338 04/28/2015  07/29/2016: TSH 6.18.  Lab Results  Component Value Date   FREET4 1.41 04/19/2020   FREET4 1.21 03/10/2019   FREET4 1.68 (H) 11/04/2018   FREET4 1.60 07/17/2018   FREET4 1.36 05/21/2017   FREET4 0.96 02/18/2017   Reviewed previous history: She started to have severe generalized mm cramps in 03/2016.   Around that time >> RUQ pain >> HIDA scan >> GB w/o clear stones, but with sludge. She had an abd. CT scan: increased size of her liver + liver steatosis.  She was also found to have a 24-hour urine free cortisol, which returned slightly elevated: Component     Latest Ref Rng & Units 06/04/2016 06/14/2016  Cortisol (Ur), Free     4.0 - 50.0 mcg/24 h  57.0 (H)  Results received     0.63 - 2.50 g/24 h  1.27  Cortisol, Plasma     Ug/dL (collected at 5 pm) 6.8    A CT abdomen (05/2016) showed normal adrenals.  A brain MRI (04/2015) showed normal pituitary gland.  A dexamethasone suppression test was normal >> no signs of Cushing's syndrome:  Component     Latest Ref Rng & Units 07/02/2016  Cortisol - AM     mcg/dL 1.8 (L)  Dexamethasone, Serum     ng/dL 385   She had a liver Bx  >> has level 3 scarring. 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  Before last visit, her  mother fell and had a hip fx (she also has Alzheimer's ds.) >> lives with pt >> she is more stressed.  ROS: Constitutional: no weight gain/no weight loss, no fatigue, no subjective hyperthermia, no subjective hypothermia Eyes: no blurry vision, no xerophthalmia ENT: no sore throat, no nodules palpated in neck, no dysphagia, no odynophagia, no hoarseness Cardiovascular: no CP/no SOB/no palpitations/no leg swelling Respiratory: no cough/no SOB/no wheezing Gastrointestinal: no N/no V/no D/no C/no acid reflux Musculoskeletal: no muscle  aches/no joint aches Skin: no rashes, no hair loss Neurological: no tremors/no numbness/no tingling/no dizziness  I reviewed pt's medications, allergies, PMH, social hx, family hx, and changes were documented in the history of present illness. Otherwise, unchanged from my initial visit note.  Past Medical History:  Diagnosis Date   CIN I (cervical intraepithelial neoplasia I)    COPD (chronic obstructive pulmonary disease) (HCC)    GERD (gastroesophageal reflux disease)    Hemochromatosis    Hepatic steatosis    Hypertension    Hypothyroidism    Labial  cyst    Inclusion cyst   Liver cirrhosis (HCC)    NASH (nonalcoholic steatohepatitis)    Positive ANA (antinuclear antibody)    Seizures ([place])    last seizure 3/17? over dose wellbutrin   Thyroid disease    Graves dis-Radioactive Iodine   Past Surgical History:  Procedure Laterality Date   CERVICAL BIOPSY  W/ LOOP ELECTRODE EXCISION     Excision labial inclusion cyst   CESAREAN SECTION     COLPOSCOPY     HAND SURGERY     SHOULDER ARTHROSCOPY WITH OPEN ROTATOR CUFF REPAIR AND DISTAL CLAVICLE ACROMINECTOMY Left 06/13/2015   Procedure: LEFT SHOULDER ARTHROSCOPY, DEBRIDEMENT, OPEN ROTATOR CUFF REPAIR, CORACOID FRACTURE FIXATION, BICEPS TENODESIS;  Surgeon: Meredith Pel, MD;  Location: [place];  Service: Orthopedics;  Laterality: Left;   SHOULDER CLOSED REDUCTION Left 09/26/2015   Procedure: CLOSED MANIPULATION SHOULDER UNDER ANESTHESIA;  Surgeon: Meredith Pel, MD;  Location: [place];  Service: Orthopedics;  Laterality: Left;   TUBAL LIGATION     Social History   Social History   Marital status: Married    Spouse name: Tommie Raymond   Number of children: 1   Years of education: 89   Occupational History   Associate Professor    Social History Main Topics   Smoking status: Former Smoker    Packs/day: 1.00    Types: Cigarettes    Quit date: 02/14/2015   Smokeless tobacco: Never Used   Alcohol use 0.0 oz/week     Comment: rare    Drug use: No     Comment: Hx of marijuana use   Sexual activity: Yes    Birth control/ protection: Surgical   Social History Narrative   Lives with husband and sister   Caffeine use: Tea/coffee daily      Current Outpatient Medications on File Prior to Visit  Medication Sig Dispense Refill   BEVESPI AEROSPHERE 9-4.8 MCG/ACT AERO INHALE 2 PUFFS INTO THE LUNGS TWICE DAILY. REPLACES ANORO INHALER. 10.7 g 6   empagliflozin (JARDIANCE) 10 MG TABS tablet Take 1 tablet (10 mg total) by mouth daily. 90 tablet 3   glucose blood (BAYER CONTOUR TEST) test strip Use 2x a day 200 each 3   levothyroxine (SYNTHROID) 125 MCG tablet Take 1 tablet (125 mcg total) by mouth daily. 90 tablet 3   losartan (COZAAR) 50 MG tablet Take 50 mg by mouth daily.     metFORMIN (GLUCOPHAGE-XR) 500 MG 24 hr tablet Take 2 tablets (1,000 mg total) by mouth daily. 180 tablet 3   omeprazole (PRILOSEC) 40 MG capsule Take 1 capsule (40 mg total) by mouth daily. 30 capsule 6   pravastatin (PRAVACHOL) 20 MG tablet Take 1 tablet (20 mg total) by mouth daily. (Patient not taking: Reported on 04/19/2020) 90 tablet 3   SURE COMFORT PEN NEEDLES 32G X 4 MM MISC USE TO INJECT LANTUS ONCE DAILY 100 each 3   No current facility-administered medications on file prior to visit.   Allergies  Allergen Reactions   Doxycycline Other (See Comments)    Flu like symptoms (high fever, chills)   Nitrofurantoin Monohyd Macro Other (See Comments)    Flu like symptoms (high fever, chills)   Septra [Sulfamethoxazole-Trimethoprim] Other (See Comments)    Flu like symptoms (high fever, chills)   Lamictal [Lamotrigine] Rash   Family History  Problem Relation Age of Onset   Hypertension Mother    Heart disease Mother    Diabetes Father    Heart disease  Father    Heart disease Brother    Breast cancer Maternal Grandmother    Uterine cancer Maternal Aunt    Seizures Neg Hx    Stomach cancer Neg Hx    Colon cancer Neg Hx    PE: BP (!)  140/100 (BP Location: Right Arm, Patient Position: Sitting, Cuff Size: Normal)   Pulse 79   Ht 5' 2"  (1.575 m)   Wt 145 lb (65.8 kg)   LMP 06/23/2016   SpO2 96%   BMI 26.52 kg/m  Wt Readings from Last 3 Encounters:  10/24/20 145 lb (65.8 kg)  04/19/20 147 lb 12.8 oz (67 kg)  04/02/19 143 lb 8 oz (65.1 kg)   Constitutional: normal weight, in NAD Eyes: PERRLA, EOMI, no exophthalmos ENT: moist mucous membranes, no thyromegaly, no cervical lymphadenopathy Cardiovascular: RRR, No MRG Respiratory: CTA B Gastrointestinal: abdomen soft, NT, ND, BS+ Musculoskeletal: no deformities, strength intact in all 4 Skin: moist, warm, no rashes Neurological: no tremor with outstretched hands, DTR normal in all 4  ASSESSMENT: 1. DM2, non-insulin-dependent  2. Hypothyroidism  -Post ablative, after RAI treatment for Graves' disease  3. HL  PLAN: 1. DM2 -Patient with longstanding, uncontrolled, type 2 diabetes, on oral antidiabetic regimen with metformin and SGLT2 inhibitor and previously GLP-1 receptor agonist (Trulicity - ran out) and insulin (Lantus - off due to good control).  At last visit, her HbA1c was slightly higher at 6.0%, but still very well controlled.  She was missing many metformin doses and we discussed about the importance of taking it consistently.  At last visit, we discussed about continuing off Trulicity and just follow her blood sugars, but we need to have a low threshold of restarting it, especially in the setting of fatty liver and truncal fat distribution. -At today's visit, sugars remain at goal at all times of the day.  She mentions missing many metformin doses so we discussed about taking them together in 1 dose in the morning.  She may skip breakfast but I advised her that if she does not get nausea if she takes them on an empty stomach, she can definitely do so.  I do not feel we need to change her Jardiance dose for now. -We will restart her Cozaar as she ran out of this,  since her microalbumin level was high at last visit. - I suggested to:  Patient Instructions  Please move: - Metformin ER 1000 mg to am  Continue: - Jardiance 10 mg before breakfast   Please continue levothyroxine 125 mcg daily  Take the thyroid hormone every day, with water, at least 30 minutes before breakfast, separated by at least 4 hours from: - acid reflux medications - calcium - iron - multivitamins  Please restart: - Pravastatin 20 mg daily - Cozaar 50 mg daily  Please return in 4-6 months with your sugar log.   - we checked her HbA1c: 6.3% (higher) - advised to check sugars at different times of the day - 1x a day, rotating check times - advised for yearly eye exams >> she is not UTD - return to clinic in 4-6 months      2. Hypothyroidism -Post RAI ablation - latest thyroid labs reviewed with pt. >> normal: Lab Results  Component Value Date   TSH 1.78 04/19/2020  - she continues on LT4 125 mcg daily - pt feels good on this dose. - we discussed about taking the thyroid hormone every day, with water, >30 minutes before breakfast, separated by >  4 hours from acid reflux medications, calcium, iron, multivitamins. Pt. is taking it correctly. - will check thyroid tests at next OV  3. HL  -Reviewed latest lipid panel from 04/2020: LDL high, triglycerides also slightly high, HDL low: Lab Results  Component Value Date   CHOL 217 (H) 04/19/2020   HDL 29.80 (L) 04/19/2020   LDLCALC 154 (H) 04/19/2020   TRIG 167.0 (H) 04/19/2020   CHOLHDL 7 04/19/2020  -At last visit, she was taking pravastatin 20 mg daily inconsistently as she was forgetting doses.  I strongly advised her to start taking it consistently.  At this visit she tells me that she ran out.  I refilled it.  + flu shot today  Philemon Kingdom, MD PhD [facility]

## 2020-10-24 NOTE — Patient Instructions (Addendum)
Please move: - Metformin ER 1000 mg to am  Continue: - Jardiance 10 mg before breakfast   Please continue levothyroxine 125 mcg daily  Take the thyroid hormone every day, with water, at least 30 minutes before breakfast, separated by at least 4 hours from: - acid reflux medications - calcium - iron - multivitamins  Please restart: - Pravastatin 20 mg daily - Cozaar 50 mg daily  Please return in 4-6 months with your sugar log.

## 2020-11-30 ENCOUNTER — Other Ambulatory Visit: Payer: Self-pay

## 2020-11-30 ENCOUNTER — Ambulatory Visit (INDEPENDENT_AMBULATORY_CARE_PROVIDER_SITE_OTHER): Payer: Medicare HMO | Admitting: Internal Medicine

## 2020-11-30 ENCOUNTER — Encounter: Payer: Self-pay | Admitting: Internal Medicine

## 2020-11-30 VITALS — BP 160/92 | HR 81 | Ht 62.0 in | Wt 146.6 lb

## 2020-11-30 DIAGNOSIS — E039 Hypothyroidism, unspecified: Secondary | ICD-10-CM

## 2020-11-30 DIAGNOSIS — E785 Hyperlipidemia, unspecified: Secondary | ICD-10-CM | POA: Diagnosis not present

## 2020-11-30 MED ORDER — AMLODIPINE BESYLATE 5 MG PO TABS: 5.0000 mg | ORAL_TABLET | Freq: Every day | ORAL | 1 refills | Status: DC

## 2020-11-30 NOTE — Progress Notes (Signed)
Patient ID: Emma Glenn, female   DOB: Apr 06, 1965, 55 y.o.   MRN: [id number]   This visit occurred during the SARS-CoV-2 public health emergency.  Safety protocols were in place, including screening questions prior to the visit, additional usage of staff PPE, and extensive cleaning of exam room while observing appropriate contact time as indicated for disinfecting solutions.   HPI: Emma Glenn is a 55 y.o.-year-old female, initially referred by her GI Dr, Dr. Hilarie Fredrickson, returning for follow-up for uncontrolled DM2, currently insulin independent, and hypothyroidism. Last visit 1.5 months ago.  She is here with her husband who offers part of the history especially about her activity, home environment, and blood pressure values.  Interim history: No increased urination, blurry vision, nausea, chest pain. She has a little SOB, no cough.  No headaches. She scheduled this appointment sooner after she noticed higher BPs since 4 days ago >> highest sBP 190s at home. On Losartan.  She tells me that she took it twice a day in the last few days in an effort to control her blood pressure.  Upon questioning, they had company over the weekend and she had more stress. No NSAIDs, no supplements, no increased salt. She is trying to quit smoking - started 3 days ago - now 5 cigs a day, prev. 1 PPD.  DM2: Reviewed HbA1c levels: Lab Results  Component Value Date   HGBA1C 6.3 (A) 10/24/2020   HGBA1C 6.0 (A) 04/19/2020   HGBA1C 5.2 03/10/2019  Labs from [place], drawn on 07/29/2016: HbA1c still very high, at 12.6%  Pt is on a regimen of: - Metformin ER 500 mg 2x a day >> missing many doses >> 1000 mg with breakfast - Jardiance 10 mg before breakfast - through patient assistance program >> takes consistently -  - through patient assistance program >> ran out 11/2019 -  >> stopped 06/2019  She checks her sugars 0-1 times a day: - am: 82-122 >> 115-128 >> 94-127 >> 110-129, 139 - 2h after b'fast:  89-188 >> 100 >>  n/c >> 125, 136 >> n/c - before lunch:  80-91 >> 114-120 >> 115 >> 91 >> 77-112 - 2h after lunch:  n/c >> <141 >> n/c >> 166, 169 >> 228 - before dinner:  109-116 >> 96-120 >> n/c >> 71, 87 - 2h after dinner: 94-106 >> <153 >> n/c >> 162 >> 131 - bedtime: 90, 103  >> <150 >> 123 >> n/c >> 98 - nighttime: n/c Lowest sugar was 80 >> 82 >> 115 >> 71 >> 77; it is unclear at which level she has hypoglycemia awareness: Highest sugar was 136 >> 153 >> 128 >> 169 >> 228.  [name] meals are: - Breakfast: Cereals or 2 eggs and toast - Lunch: Skips - Dinner: Meat and veggies - Snacks: Fruit  Stopped milkshakes. Occasional sodas.  Meter: Bayer Contour  -No CKD, last BUN/creatinine:  Lab Results  Component Value Date   BUN 9 04/19/2020   BUN 10 03/10/2019   CREATININE 0.78 04/19/2020   CREATININE 0.62 03/10/2019    -+ HL; last set of lipids: Lab Results  Component Value Date   CHOL 217 (H) 04/19/2020   HDL 29.80 (L) 04/19/2020   LDLCALC 154 (H) 04/19/2020   TRIG 167.0 (H) 04/19/2020   CHOLHDL 7 04/19/2020   We restarted pravastatin 12/2016, after we consulted with her gastroenterologist, Dr. Hilarie Fredrickson.  LFTs remained stable after starting the statin but started to increase afterwards, being higher at last check  in 08/2017.  Dr. Hilarie Fredrickson is aware of the above and considers these to be related to her fatty liver disease.  We continued her pravastatin >> ran out >> restarted at last visit. Of note, she is also seen Dr. Coralyn Pear (hepatologist) and she has liver ultrasound every 6 months.  Latest LFTs were normal at last check: Lab Results  Component Value Date   ALT 25 04/19/2020   AST 26 04/19/2020   ALKPHOS 60 04/19/2020   BILITOT 0.7 04/19/2020   ACR is fluctuating: Lab Results  Component Value Date   MICRALBCREAT 44.9 (H) 04/19/2020   MICRALBCREAT 20.2 07/17/2018   MICRALBCREAT 237.7 (H) 04/02/2018   MICRALBCREAT 16.1 02/18/2017  07/29/2016: UACR was high, 148.9 Off Cozaar after she  ran out.  - last eye exam: 03/2019: No DR  -No numbness and tingling in her feet.  Hypothyroidism:  Pt is on levothyroxine 125 mcg daily, taken: - in am - fasting - at least 30 min from b'fast - no calcium - no iron - no multivitamins - + PPIs at bedtime - occas. - not on Biotin  Reviewed her TFTs: Lab Results  Component Value Date   TSH 1.78 04/19/2020   TSH 0.33 (L) 03/10/2019   TSH 0.10 (L) 11/04/2018   TSH 0.12 (L) 07/17/2018   TSH 4.77 (H) 04/02/2018   TSH 2.65 05/21/2017   TSH 17.64 (H) 02/18/2017   TSH 4.45 06/04/2016   TSH 2.338 04/28/2015  07/29/2016: TSH 6.18.  Lab Results  Component Value Date   FREET4 1.41 04/19/2020   FREET4 1.21 03/10/2019   FREET4 1.68 (H) 11/04/2018   FREET4 1.60 07/17/2018   FREET4 1.36 05/21/2017   FREET4 0.96 02/18/2017   Reviewed previous history: She started to have severe generalized mm cramps in 03/2016.  Around that time >> RUQ pain >> HIDA scan >> GB w/o clear stones, but with sludge. She had an abd. CT scan: increased size of her liver + liver steatosis.  She was also found to have a 24-hour urine free cortisol, which returned slightly elevated: Component     Latest Ref Rng & Units 06/04/2016 06/14/2016  Cortisol (Ur), Free     4.0 - 50.0 mcg/24 h  57.0 (H)  Results received     0.63 - 2.50 g/24 h  1.27  Cortisol, Plasma     Ug/dL (collected at 5 pm) 6.8    A CT abdomen (05/2016) showed normal adrenals.  A brain MRI (04/2015) showed normal pituitary gland.  A dexamethasone suppression test was normal >> no signs of Cushing's syndrome:  Component     Latest Ref Rng & Units 07/02/2016  Cortisol - AM     mcg/dL 1.8 (L)  Dexamethasone, Serum     ng/dL 385   She had a liver Bx  >> has level 3 scarring. 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  Mother has Alzheimer's disease - lives with pt >> increased stress.  ROS: + See HPI  I reviewed pt's medications, allergies, PMH, social  hx, family hx, and changes were documented in the history of present illness. Otherwise, unchanged from my initial visit note.  Past Medical History:  Diagnosis Date   CIN I (cervical intraepithelial neoplasia I)    COPD (chronic obstructive pulmonary disease) (HCC)    GERD (gastroesophageal reflux disease)    Hemochromatosis    Hepatic steatosis    Hypertension    Hypothyroidism    Labial cyst  Inclusion cyst   Liver cirrhosis (HCC)    NASH (nonalcoholic steatohepatitis)    Positive ANA (antinuclear antibody)    Seizures ([place])    last seizure 3/17? over dose wellbutrin   Thyroid disease    Graves dis-Radioactive Iodine   Past Surgical History:  Procedure Laterality Date   CERVICAL BIOPSY  W/ LOOP ELECTRODE EXCISION     Excision labial inclusion cyst   CESAREAN SECTION     COLPOSCOPY     HAND SURGERY     SHOULDER ARTHROSCOPY WITH OPEN ROTATOR CUFF REPAIR AND DISTAL CLAVICLE ACROMINECTOMY Left 06/13/2015   Procedure: LEFT SHOULDER ARTHROSCOPY, DEBRIDEMENT, OPEN ROTATOR CUFF REPAIR, CORACOID FRACTURE FIXATION, BICEPS TENODESIS;  Surgeon: Meredith Pel, MD;  Location: [facility];  Service: Orthopedics;  Laterality: Left;   SHOULDER CLOSED REDUCTION Left 09/26/2015   Procedure: CLOSED MANIPULATION SHOULDER UNDER ANESTHESIA;  Surgeon: Meredith Pel, MD;  Location: [place];  Service: Orthopedics;  Laterality: Left;   TUBAL LIGATION     Social History   Social History   Marital status: Married    Spouse name: Tommie Raymond   Number of children: 1   Years of education: 59   Occupational History   Associate Professor    Social History Main Topics   Smoking status: Former Smoker    Packs/day: 1.00    Types: Cigarettes    Quit date: 02/14/2015   Smokeless tobacco: Never Used   Alcohol use 0.0 oz/week     Comment: rare   Drug use: No     Comment: Hx of marijuana use   Sexual activity: Yes    Birth control/ protection: Surgical   Social History Narrative   Lives with husband and  sister   Caffeine use: Tea/coffee daily      Current Outpatient Medications on File Prior to Visit  Medication Sig Dispense Refill   BEVESPI AEROSPHERE 9-4.8 MCG/ACT AERO INHALE 2 PUFFS INTO THE LUNGS TWICE DAILY. REPLACES ANORO INHALER. 10.7 g 6   empagliflozin (JARDIANCE) 10 MG TABS tablet Take 1 tablet (10 mg total) by mouth daily. 90 tablet 3   glucose blood (BAYER CONTOUR TEST) test strip Use 2x a day 200 each 3   levothyroxine (SYNTHROID) 125 MCG tablet Take 1 tablet (125 mcg total) by mouth daily. 90 tablet 3   losartan (COZAAR) 50 MG tablet Take 1 tablet (50 mg total) by mouth daily. 90 tablet 3   metFORMIN (GLUCOPHAGE-XR) 500 MG 24 hr tablet Take 2 tablets (1,000 mg total) by mouth daily. 180 tablet 3   omeprazole (PRILOSEC) 40 MG capsule Take 1 capsule (40 mg total) by mouth daily. 30 capsule 6   pravastatin (PRAVACHOL) 20 MG tablet Take 1 tablet (20 mg total) by mouth daily. 90 tablet 3   SURE COMFORT PEN NEEDLES 32G X 4 MM MISC USE TO INJECT LANTUS ONCE DAILY 100 each 3   No current facility-administered medications on file prior to visit.   Allergies  Allergen Reactions   Doxycycline Other (See Comments)    Flu like symptoms (high fever, chills)   Nitrofurantoin Monohyd Macro Other (See Comments)    Flu like symptoms (high fever, chills)   Septra [Sulfamethoxazole-Trimethoprim] Other (See Comments)    Flu like symptoms (high fever, chills)   Lamictal [Lamotrigine] Rash   Family History  Problem Relation Age of Onset   Hypertension Mother    Heart disease Mother    Diabetes Father    Heart disease Father    Heart disease  Brother    Breast cancer Maternal Grandmother    Uterine cancer Maternal Aunt    Seizures Neg Hx    Stomach cancer Neg Hx    Colon cancer Neg Hx    PE: BP (!) 160/92 (BP Location: Left Arm, Patient Position: Sitting, Cuff Size: Normal)   Pulse 81   Ht 5' 2"  (1.575 m)   Wt 146 lb 9.6 oz (66.5 kg)   LMP 06/23/2016   SpO2 96%   BMI 26.81 kg/m   Wt Readings from Last 3 Encounters:  11/30/20 146 lb 9.6 oz (66.5 kg)  10/24/20 145 lb (65.8 kg)  04/19/20 147 lb 12.8 oz (67 kg)   Constitutional: normal weight, in NAD Eyes: PERRLA, EOMI, no exophthalmos ENT: moist mucous membranes, no thyromegaly, no cervical lymphadenopathy Cardiovascular: RRR, No MRG Respiratory: CTA B Gastrointestinal: abdomen soft, NT, ND, BS+ Musculoskeletal: no deformities, strength intact in all 4 Skin: moist, warm, no rashes Neurological: no tremor with outstretched hands, DTR normal in all 4  ASSESSMENT: 1. DM2, non-insulin-dependent  2. Hypothyroidism  -Post ablative, after RAI treatment for Graves' disease  3. HL  4. HTN  PLAN: 1. DM2 -Patient with longstanding, uncontrolled, type 2 diabetes, on oral antidiabetic regimen with metformin and SGLT2 inhibitor, and previously on GLP-1 receptor agonist, and insulin, but all of these after she ran out of Trulicity and stopping Lantus due to good control.  At last visit, sugars were at goal at all times of the day but she was missing metformin doses and we discussed about moving the entire dose of in the morning for better compliance.  At last visit, she was off her Cozaar and pravastatin and we restarted them.  HbA1c at that time was 6.3%, slightly higher. -At this visit, sugars appear to be mostly at goal, but she had a high blood sugar at 228 after dietary indiscretions.  I did advise her to write down in her log about possible reasons for the high blood sugars.  However, for now, I would not suggest a change in regimen. - I suggested to:  Patient Instructions  Please continue: - Metformin ER 1000 mg to am - Jardiance 10 mg before breakfast   Please continue levothyroxine 125 mcg daily  Take the thyroid hormone every day, with water, at least 30 minutes before breakfast, separated by at least 4 hours from: - acid reflux medications - calcium - iron - multivitamins  Start Norvasc 5 mg  daily.  Please return in 4-6 months with your sugar log.   - advised to check sugars at different times of the day - 1x a day, rotating check times - advised for yearly eye exams >> she is UTD - return to clinic in 4-6 months      2. Hypothyroidism -Developed after RAI ablation - latest thyroid labs reviewed with pt. >> normal: Lab Results  Component Value Date   TSH 1.78 04/19/2020  - she continues on LT4 125 mcg daily - pt feels good on this dose. - we discussed about taking the thyroid hormone every day, with water, >30 minutes before breakfast, separated by >4 hours from acid reflux medications, calcium, iron, multivitamins. Pt. is taking it correctly. - will check thyroid tests at next visit  3. HL  -Reviewed latest lipid panel from 04/2020: LDL high, triglycerides also slightly high, HDL low: Lab Results  Component Value Date   CHOL 217 (H) 04/19/2020   HDL 29.80 (L) 04/19/2020   LDLCALC 154 (H) 04/19/2020  TRIG 167.0 (H) 04/19/2020   CHOLHDL 7 04/19/2020  -Last visit we refilled her pravastatin 20 mg daily.  She was forgetting doses in the past, now taken more consistently.  No side effects from it.  4. HTN -This is usually managed by PCP -At today's visit, blood pressure is high, at 160/92 and she had some higher blood pressures at home, with the highest systolic being 045 -Discussed about definition of elevated blood pressure and hypertension and what constitutes a normal blood pressure value. -For now, I advised her to start amlodipine 5 mg daily and continue Cozaar once a day -I advised her to get in touch with PCP and schedule a follow-up appointment for further management.  Philemon Kingdom, MD PhD [facility] Endocrinology

## 2020-11-30 NOTE — Patient Instructions (Addendum)
Please continue: - Metformin ER 1000 mg to am - Jardiance 10 mg before breakfast   Please continue levothyroxine 125 mcg daily  Take the thyroid hormone every day, with water, at least 30 minutes before breakfast, separated by at least 4 hours from: - acid reflux medications - calcium - iron - multivitamins  Start Norvasc 5 mg daily.  Please return in 4-6 months with your sugar log.

## 2020-12-05 DIAGNOSIS — I10 Essential (primary) hypertension: Secondary | ICD-10-CM | POA: Diagnosis not present

## 2020-12-05 DIAGNOSIS — R0789 Other chest pain: Secondary | ICD-10-CM | POA: Diagnosis not present

## 2020-12-13 ENCOUNTER — Other Ambulatory Visit: Payer: Self-pay | Admitting: Nurse Practitioner

## 2020-12-13 DIAGNOSIS — K7460 Unspecified cirrhosis of liver: Secondary | ICD-10-CM

## 2020-12-13 DIAGNOSIS — K7469 Other cirrhosis of liver: Secondary | ICD-10-CM | POA: Diagnosis not present

## 2020-12-13 DIAGNOSIS — K7581 Nonalcoholic steatohepatitis (NASH): Secondary | ICD-10-CM | POA: Diagnosis not present

## 2020-12-14 DIAGNOSIS — I10 Essential (primary) hypertension: Secondary | ICD-10-CM | POA: Diagnosis not present

## 2020-12-28 ENCOUNTER — Ambulatory Visit
Admission: RE | Admit: 2020-12-28 | Discharge: 2020-12-28 | Disposition: A | Discharge status: Home / Self Care | Payer: Medicare HMO | Source: Ambulatory Visit | Attending: Nurse Practitioner | Admitting: Nurse Practitioner

## 2020-12-28 DIAGNOSIS — K7581 Nonalcoholic steatohepatitis (NASH): Secondary | ICD-10-CM

## 2021-01-17 ENCOUNTER — Encounter: Payer: Self-pay | Admitting: Cardiovascular Disease

## 2021-01-17 ENCOUNTER — Other Ambulatory Visit: Payer: Self-pay

## 2021-01-17 ENCOUNTER — Ambulatory Visit (INDEPENDENT_AMBULATORY_CARE_PROVIDER_SITE_OTHER): Payer: Medicare HMO | Admitting: Cardiovascular Disease

## 2021-01-17 VITALS — BP 130/78 | HR 88 | Ht 62.0 in | Wt 147.8 lb

## 2021-01-17 DIAGNOSIS — I10 Essential (primary) hypertension: Secondary | ICD-10-CM

## 2021-01-17 DIAGNOSIS — E785 Hyperlipidemia, unspecified: Secondary | ICD-10-CM

## 2021-01-17 DIAGNOSIS — F17200 Nicotine dependence, unspecified, uncomplicated: Secondary | ICD-10-CM | POA: Diagnosis not present

## 2021-01-17 NOTE — Progress Notes (Signed)
Cardiology Office Note:    Date:  01/17/2021   ID:  [name], DOB 12/15/1965, MRN [id number]  PCP:  Shirline Frees, MD   [facility] Providers Cardiologist:   Carlas Vandyne      Referring MD: Shirline Frees, MD   Chief Complaint  Patient presents with   Hypertension    History of Present Illness:    Emma Glenn is a 55 y.o. female with a hx of  HTN,  HLD. We were asked to see her by Dr. Kenton Kingfisher for further evaluation and management of her HTN  Emma Glenn had HTN several years ago .   Was ok for a while , now she is back on meds.  Has lots of health issues Is not able to exercise much ,  waks some  Does not have enough energy to exercise Takes care of her mother , causes lots of stress   Eats lots of cereal  Sandwich for lunch - [employer] , [employer], [employer]  [employer]  Bacon, sausage, bologna   Smokes 1 ppd - had quit for 3 years, restarted 2 years Wt is 147 lbs   Multiple family members ( father, brother, grandmother, all had CAD )   Is on Pravastatin for HLD - Dr. Cruzita Lederer put her on Prava because of her NASH LDL is 154 ( while off pravastatin )  Will get a coronary calcium score.  If her coronary calcium score is high then we will refer her to lipid clinic for consideration of PCSK9 inhibitors or other medicines.  We will want to start her on something that will not affect her nonalcoholic liver disease.  Past Medical History:  Diagnosis Date   Acute stress reaction    Allergic rhinitis    Anxiety    Autoimmune thyroiditis    CIN I (cervical intraepithelial neoplasia I)    Cirrhosis of liver (HCC)    COPD (chronic obstructive pulmonary disease) (HCC)    Depression    DM (diabetes mellitus) (HCC)    GERD (gastroesophageal reflux disease)    Hemochromatosis    Hepatic steatosis    Hereditary hemochromatosis ([place])    Hyperlipidemia    Hypertension    Hypothyroidism    Labial cyst    Inclusion cyst   Liver cirrhosis (HCC)    Morbid obesity (HCC)    NASH  (nonalcoholic steatohepatitis)    Positive ANA (antinuclear antibody)    Pure hypercholesterolemia    Seizures ([place])    last seizure 3/17? over dose wellbutrin   Thyroid disease    Graves dis-Radioactive Iodine   Tobacco dependence     Past Surgical History:  Procedure Laterality Date   CERVICAL BIOPSY  W/ LOOP ELECTRODE EXCISION     Excision labial inclusion cyst   CESAREAN SECTION     COLPOSCOPY     HAND SURGERY     SHOULDER ARTHROSCOPY WITH OPEN ROTATOR CUFF REPAIR AND DISTAL CLAVICLE ACROMINECTOMY Left 06/13/2015   Procedure: LEFT SHOULDER ARTHROSCOPY, DEBRIDEMENT, OPEN ROTATOR CUFF REPAIR, CORACOID FRACTURE FIXATION, BICEPS TENODESIS;  Surgeon: Meredith Pel, MD;  Location: [place];  Service: Orthopedics;  Laterality: Left;   SHOULDER CLOSED REDUCTION Left 09/26/2015   Procedure: CLOSED MANIPULATION SHOULDER UNDER ANESTHESIA;  Surgeon: Meredith Pel, MD;  Location: [place];  Service: Orthopedics;  Laterality: Left;   TUBAL LIGATION      Current Medications: Current Meds  Medication Sig   amLODipine (NORVASC) 5 MG tablet Take 1 tablet (5 mg total) by  mouth daily.   BEVESPI AEROSPHERE 9-4.8 MCG/ACT AERO INHALE 2 PUFFS INTO THE LUNGS TWICE DAILY. REPLACES ANORO INHALER.   empagliflozin (JARDIANCE) 10 MG TABS tablet Take 1 tablet (10 mg total) by mouth daily.   glucose blood (BAYER CONTOUR TEST) test strip Use 2x a day   levothyroxine (SYNTHROID) 125 MCG tablet Take 1 tablet (125 mcg total) by mouth daily.   losartan (COZAAR) 50 MG tablet Take 1 tablet (50 mg total) by mouth daily.   metFORMIN (GLUCOPHAGE-XR) 500 MG 24 hr tablet Take 2 tablets (1,000 mg total) by mouth daily.   pravastatin (PRAVACHOL) 20 MG tablet Take 1 tablet (20 mg total) by mouth daily.   SURE COMFORT PEN NEEDLES 32G X 4 MM MISC USE TO INJECT LANTUS ONCE DAILY     Allergies:   Doxycycline, Nitrofurantoin monohyd macro, Septra [sulfamethoxazole-trimethoprim], and Lamictal [lamotrigine]   Social History    Socioeconomic History   Marital status: Married    Spouse name: Tommie Raymond   Number of children: 1   Years of education: 12   Highest education level: Not on file  Occupational History   Not on file  Tobacco Use   Smoking status: Every Day    Packs/day: 1.00    Types: Cigarettes   Smokeless tobacco: Never  Vaping Use   Vaping Use: Never used  Substance and Sexual Activity   Alcohol use: Yes    Alcohol/week: 0.0 standard drinks    Comment: rare   Drug use: No    Comment: Hx of marijuana use   Sexual activity: Yes    Birth control/protection: Surgical  Other Topics Concern   Not on file  Social History Narrative   Lives with husband and sister   Caffeine use: Tea/coffee daily   Social Determinants of Health   Financial Resource Strain: Not on file  Food Insecurity: Not on file  Transportation Needs: Not on file  Physical Activity: Not on file  Stress: Not on file  Social Connections: Not on file     Family History: The patient's family history includes Breast cancer in her maternal grandmother; Diabetes in her father; Heart disease in her brother, father, and mother; Hypertension in her mother; Uterine cancer in her maternal aunt. There is no history of Seizures, Stomach cancer, or Colon cancer.  ROS:   Please see the history of present illness.     All other systems reviewed and are negative.  EKGs/Labs/Other Studies Reviewed:    The following studies were reviewed today:   EKG: January 17, 2021: Normal sinus rhythm at 88.  No ST or T wave changes.  Recent Labs: 04/19/2020: ALT 25; BUN 9; Creatinine, Ser 0.78; Potassium 4.4; Sodium 137; TSH 1.78  Recent Lipid Panel    Component Value Date/Time   CHOL 217 (H) 04/19/2020 1118   TRIG 167.0 (H) 04/19/2020 1118   HDL 29.80 (L) 04/19/2020 1118   CHOLHDL 7 04/19/2020 1118   VLDL 33.4 04/19/2020 1118   LDLCALC 154 (H) 04/19/2020 1118   LDLCALC 131 (H) 12/13/2016 1654     Risk Assessment/Calculations:            Physical Exam:    VS:  BP 130/78 (BP Location: Left Arm, Patient Position: Sitting, Cuff Size: Normal)   Pulse 88   Ht 5' 2"  (1.575 m)   Wt 147 lb 12.8 oz (67 kg)   LMP 06/23/2016   SpO2 96%   BMI 27.03 kg/m     Wt Readings from Last 3  Encounters:  01/17/21 147 lb 12.8 oz (67 kg)  11/30/20 146 lb 9.6 oz (66.5 kg)  10/24/20 145 lb (65.8 kg)     GEN:  Well nourished, well developed in no acute distress HEENT: Normal NECK: No JVD; No carotid bruits LYMPHATICS: No lymphadenopathy CARDIAC:  RR  RESPIRATORY:  Clear to auscultation without rales, wheezing or rhonchi  ABDOMEN: Soft, non-tender, non-distended MUSCULOSKELETAL:  No edema; No deformity  SKIN: Warm and dry NEUROLOGIC:  Alert and oriented x 3 PSYCHIATRIC:  Normal affect   ASSESSMENT:    1. Hyperlipidemia, unspecified hyperlipidemia type   2. Hypertension, essential   3. Smoker    PLAN:    In order of problems listed above:  1.  Hypertension: Dr. Kenton Kingfisher  has started her on amlodipine and losartan.  Blood pressure looks great.  I do not think that she needs any additional medications for this.  2.  Hyperlipidemia: Her LDL is 154.  She is on pravastatin per her endocrinologist.  It sounds like the pravastatin was chosen to not elevate her liver enzymes.  She has nonalcoholic liver cirrhosis.  I would like to do a coronary calcium score.  If her score is high then we will refer her to our lipid clinic for consideration of of a PCSK9 inhibitor or Inclasarin              Medication Adjustments/Labs and Tests Ordered: Current medicines are reviewed at length with the patient today.  Concerns regarding medicines are outlined above.  Orders Placed This Encounter  Procedures   CT CARDIAC SCORING (SELF PAY ONLY)   EKG 12-Lead   No orders of the defined types were placed in this encounter.   Patient Instructions  Medication Instructions:  NO CHANGES *If you need a refill on your cardiac medications before  your next appointment, please call your pharmacy*   Lab Work: NONE If you have labs (blood work) drawn today and your tests are completely normal, you will receive your results only by: [place] (if you have MyChart) OR A paper copy in the mail If you have any lab test that is abnormal or we need to change your treatment, we will call you to review the results.   Testing/Procedures: CALCIUM SCORE   Follow-Up: At [facility], you and your health needs are our priority.  As part of our continuing mission to provide you with exceptional heart care, we have created designated Provider Care Teams.  These Care Teams include your primary Cardiologist (physician) and Advanced Practice Providers (APPs -  Physician Assistants and Nurse Practitioners) who all work together to provide you with the care you need, when you need it.  We recommend signing up for the patient portal called "MyChart".  Sign up information is provided on this After Visit Summary.  MyChart is used to connect with patients for Virtual Visits (Telemedicine).  Patients are able to view lab/test results, encounter notes, upcoming appointments, etc.  Non-urgent messages can be sent to your provider as well.   To learn more about what you can do with MyChart, go to [email or web address].    Your next appointment:   1 year(s)  The format for your next appointment:   In Person  Provider:    Robbie Lis, PA-C, Cecilie Kicks, NP, Ermalinda Barrios, PA-C, Christen Bame, NP, or Richardson Dopp, PA-C         :1}  OR DR [facility]  Other Instructions NONE    Signed, Mertie Moores, MD  01/17/2021  5:34 PM    [facility]

## 2021-01-17 NOTE — Patient Instructions (Addendum)
Medication Instructions:  NO CHANGES *If you need a refill on your cardiac medications before your next appointment, please call your pharmacy*   Lab Work: NONE If you have labs (blood work) drawn today and your tests are completely normal, you will receive your results only by: [place] (if you have MyChart) OR A paper copy in the mail If you have any lab test that is abnormal or we need to change your treatment, we will call you to review the results.   Testing/Procedures: CALCIUM SCORE   Follow-Up: At [facility], you and your health needs are our priority.  As part of our continuing mission to provide you with exceptional heart care, we have created designated Provider Care Teams.  These Care Teams include your primary Cardiologist (physician) and Advanced Practice Providers (APPs -  Physician Assistants and Nurse Practitioners) who all work together to provide you with the care you need, when you need it.  We recommend signing up for the patient portal called "MyChart".  Sign up information is provided on this After Visit Summary.  MyChart is used to connect with patients for Virtual Visits (Telemedicine).  Patients are able to view lab/test results, encounter notes, upcoming appointments, etc.  Non-urgent messages can be sent to your provider as well.   To learn more about what you can do with MyChart, go to [email or web address].    Your next appointment:   1 year(s)  The format for your next appointment:   In Person  Provider:    Robbie Lis, PA-C, Cecilie Kicks, NP, Ermalinda Barrios, PA-C, Christen Bame, NP, or Richardson Dopp, PA-C         :1}  OR DR Acie Fredrickson  Other Instructions NONE

## 2021-01-19 NOTE — Addendum Note (Signed)
Addended by: Thora Lance on: 01/19/2021 10:49 AM   Modules accepted: Orders

## 2021-02-19 ENCOUNTER — Other Ambulatory Visit: Payer: Self-pay

## 2021-02-19 ENCOUNTER — Ambulatory Visit (INDEPENDENT_AMBULATORY_CARE_PROVIDER_SITE_OTHER)
Admission: RE | Admit: 2021-02-19 | Discharge: 2021-02-19 | Disposition: A | Discharge status: Home / Self Care | Payer: Self-pay | Source: Ambulatory Visit | Attending: Cardiovascular Disease | Admitting: Cardiovascular Disease

## 2021-02-19 DIAGNOSIS — F17200 Nicotine dependence, unspecified, uncomplicated: Secondary | ICD-10-CM

## 2021-02-19 DIAGNOSIS — I10 Essential (primary) hypertension: Secondary | ICD-10-CM

## 2021-02-19 DIAGNOSIS — E785 Hyperlipidemia, unspecified: Secondary | ICD-10-CM

## 2021-02-27 ENCOUNTER — Telehealth: Payer: Self-pay

## 2021-02-27 DIAGNOSIS — E785 Hyperlipidemia, unspecified: Secondary | ICD-10-CM

## 2021-02-27 NOTE — Telephone Encounter (Signed)
-----   Message from Thayer Headings, MD sent at 02/21/2021  7:05 PM EST ----- Coronary calcium score of 1.53. This was 71st percentile for age-, race-, and sex-matched controls  She has NASH. Lets refer to lipid clinic for consideration of an alternative lipid lowering medication - stronger statin vs. PCSK9i or Inclisiran

## 2021-02-27 NOTE — Telephone Encounter (Signed)
Results discussed with patient, she is agreeable to referral to lipid clinic.

## 2021-03-08 ENCOUNTER — Ambulatory Visit (INDEPENDENT_AMBULATORY_CARE_PROVIDER_SITE_OTHER): Payer: Medicare HMO | Admitting: Pharmacist

## 2021-03-08 ENCOUNTER — Other Ambulatory Visit: Payer: Self-pay

## 2021-03-08 DIAGNOSIS — E785 Hyperlipidemia, unspecified: Secondary | ICD-10-CM

## 2021-03-08 DIAGNOSIS — R931 Abnormal findings on diagnostic imaging of heart and coronary circulation: Secondary | ICD-10-CM | POA: Diagnosis not present

## 2021-03-08 MED ORDER — PRAVASTATIN SODIUM 80 MG PO TABS: 80.0000 mg | ORAL_TABLET | Freq: Every day | ORAL | 3 refills | Status: DC

## 2021-03-08 NOTE — Patient Instructions (Signed)
It was nice to meet you today!  Your LDL goal is < 70 mg/dL. Your last LDL was elevated at 154 on 04/19/20.  We will increase your pravastatin to 80 mg daily.   We will have you come in on March 6th, 2023 to check your lipid and liver function, anytime after 7:30 AM fasting.

## 2021-03-08 NOTE — Progress Notes (Signed)
Patient ID: Emma Glenn                 DOB: Sep 20, 1965                    MRN: [id number]     HPI: Emma Glenn is a 56 y.o. female patient referred to lipid clinic by Dr. Acie Glenn. PMH is significant for HTN, HLD, nonalcoholic liver disease (NASH cirrhosis), hypothyroidism, seizures, T2DM, COPD, GERD, and obesity. Back in 12/2016, pravastatin was restarted after consultation with GI MD, Dr. Hilarie Glenn. LFTs remained stable after starting the statin but started to increase after (was high at check in 08/2017). GI MD believed high LFTs to be d/t fatty liver disease vs statin, so pravastatin was continued. At her visit on 10/24/20, pt reported she had run out and had not been taking her statin. She had previously been taking it inconsistently, was forgetting doses. In 10/22, patient endorsed taking her statin more consistent with no side effects.   At visit with transplant NP, discussed that there is no CI to statin use in cirrhosis, and some data supporting decreased all cause mortality in patients with NASH who are on statin therapy. Referred to PCP to discuss restarting her statin.  At last visit on 01/17/21, MD confirmed pt is taking pravastatin 20 mg daily for HLD. Dr. Cruzita Glenn previously started her on pravastatin because of her NASH. On 02/19/21, CT revealed a CAC of 1.53 (71st percentile for age/sex) and pt was referred to lipid clinic.  Pt presents to PharmD clinic in good spirits. Pt endorses that she is currently taking pravastatin 52m daily. She has not had any problems with taking pravastatin in the past, but has just recently restarted it this past August. She recalls that she had a seizure in 2017, which lead her to getting on disability and becoming non-adherent with her pravastatin. She remembered taking Livalo at one point in the past, unsure why it was changed to pravastatin. She endorses not eating very healthy, but is starting to exercise more by going on walks once-twice weekly with her  husband.  Current Medications: pravastatin 20 mg daily Previous Medications: Livalo 4 mg daily Risk Factors: family hx of CAD + elevated CAC + T2DM  LDL goal: <70 d/t elevated CAC + T2DM + fam hx of CAD  Diet: Eats lots of cereal  Sandwich for lunch - [employer], [employer], [employer], [employer] - does not cook a lot Bacon, sausage, bologna   Exercise: Is not able to exercise much due to health conditions, walks some once or twice a week, does not have enough energy to exercise  Family History: Multiple family members (father, brother, grandmother, all had CAD)  Heart disease Mother   Hypertension Mother   Heart disease Father   Diabetes Father   Hypertension Father   Heart disease Brother   Diabetes Brother   Stroke Brother   Social History: Takes care of her mother , causes lots of stress,  current smoker, smokes 1 ppd - had quit for 3 years, restarted 2 years Wt is 147 lbs   Labs: (04/19/20): TC 217, TGL 167, HDL 29.90, LDL 154 (no meds) (02/18/17): TC 124, TGL 137, HDL 17.50, LDL 79 (pravastatin 20 mg) (12/13/16): TGL 190, LDL 131 (baseline)   Past Medical History:  Diagnosis Date   Acute stress reaction    Allergic rhinitis    Anxiety    Autoimmune thyroiditis    CIN I (cervical intraepithelial neoplasia  I)    Cirrhosis of liver (HCC)    COPD (chronic obstructive pulmonary disease) (HCC)    Depression    DM (diabetes mellitus) (HCC)    GERD (gastroesophageal reflux disease)    Hemochromatosis    Hepatic steatosis    Hereditary hemochromatosis ([place])    Hyperlipidemia    Hypertension    Hypothyroidism    Labial cyst    Inclusion cyst   Liver cirrhosis (HCC)    Morbid obesity (HCC)    NASH (nonalcoholic steatohepatitis)    Positive ANA (antinuclear antibody)    Pure hypercholesterolemia    Seizures ([place])    last seizure 3/17? over dose wellbutrin   Thyroid disease    Graves dis-Radioactive Iodine   Tobacco dependence     Current Outpatient Medications on File Prior  to Visit  Medication Sig Dispense Refill   amLODipine (NORVASC) 5 MG tablet Take 1 tablet (5 mg total) by mouth daily. 30 tablet 1   BEVESPI AEROSPHERE 9-4.8 MCG/ACT AERO INHALE 2 PUFFS INTO THE LUNGS TWICE DAILY. REPLACES ANORO INHALER. 10.7 g 6   empagliflozin (JARDIANCE) 10 MG TABS tablet Take 1 tablet (10 mg total) by mouth daily. 90 tablet 3   glucose blood (BAYER CONTOUR TEST) test strip Use 2x a day 200 each 3   levothyroxine (SYNTHROID) 125 MCG tablet Take 1 tablet (125 mcg total) by mouth daily. 90 tablet 3   losartan (COZAAR) 50 MG tablet Take 1 tablet (50 mg total) by mouth daily. 90 tablet 3   metFORMIN (GLUCOPHAGE-XR) 500 MG 24 hr tablet Take 2 tablets (1,000 mg total) by mouth daily. 180 tablet 3   omeprazole (PRILOSEC) 40 MG capsule Take 1 capsule (40 mg total) by mouth daily. (Patient not taking: Reported on 01/17/2021) 30 capsule 6   pravastatin (PRAVACHOL) 20 MG tablet Take 1 tablet (20 mg total) by mouth daily. 90 tablet 3   SURE COMFORT PEN NEEDLES 32G X 4 MM MISC USE TO INJECT LANTUS ONCE DAILY 100 each 3   No current facility-administered medications on file prior to visit.    Allergies  Allergen Reactions   Doxycycline Other (See Comments)    Flu like symptoms (high fever, chills)   Nitrofurantoin Monohyd Macro Other (See Comments)    Flu like symptoms (high fever, chills)   Septra [Sulfamethoxazole-Trimethoprim] Other (See Comments)    Flu like symptoms (high fever, chills)   Lamictal [Lamotrigine] Rash    Assessment/Plan:  1. Hyperlipidemia - LDL is elevated above goal of <70 mg/dL due to elevated CAC score, T2DM, and fam hx of CAD. Transplant provider and endocrinologist are amendable to pt remaining on statin therapy. Pt has done well symptomatically on pravastatin 20 mg and LFTs have remained generally stable. As LDL was still elevated above goal in 2019 while on pravastatin 20 mg daily, will increase to pravastatin 79m daily for maximal LDL lowering with a  less hepatotoxic statin. F/u scheduled in 5 weeks to assess LDL and LFTs. Encouraged patient to incorporate more fruits and veggies into diet and increasing walking, as able.   KJethro Poling PharmD Student   Megan E. Supple, PharmD, BCACP, [id number] [place]  [address]Phone: [phone] Fax: [phone]/26/2023 2:56 PM

## 2021-03-09 ENCOUNTER — Emergency Department (HOSPITAL_COMMUNITY): Payer: Medicare HMO

## 2021-03-09 ENCOUNTER — Observation Stay (HOSPITAL_COMMUNITY)
Admission: EM | Admit: 2021-03-09 | Discharge: 2021-03-09 | Disposition: A | Discharge status: Home / Self Care | Payer: Medicare HMO | Attending: Family Medicine | Admitting: Family Medicine

## 2021-03-09 ENCOUNTER — Observation Stay (HOSPITAL_BASED_OUTPATIENT_CLINIC_OR_DEPARTMENT_OTHER): Payer: Medicare HMO

## 2021-03-09 ENCOUNTER — Other Ambulatory Visit: Payer: Self-pay

## 2021-03-09 ENCOUNTER — Encounter (HOSPITAL_COMMUNITY): Payer: Self-pay

## 2021-03-09 DIAGNOSIS — I10 Essential (primary) hypertension: Secondary | ICD-10-CM | POA: Diagnosis not present

## 2021-03-09 DIAGNOSIS — I6389 Other cerebral infarction: Secondary | ICD-10-CM | POA: Diagnosis not present

## 2021-03-09 DIAGNOSIS — E1159 Type 2 diabetes mellitus with other circulatory complications: Secondary | ICD-10-CM

## 2021-03-09 DIAGNOSIS — E039 Hypothyroidism, unspecified: Secondary | ICD-10-CM | POA: Diagnosis present

## 2021-03-09 DIAGNOSIS — R29818 Other symptoms and signs involving the nervous system: Secondary | ICD-10-CM | POA: Diagnosis not present

## 2021-03-09 DIAGNOSIS — I6501 Occlusion and stenosis of right vertebral artery: Secondary | ICD-10-CM | POA: Diagnosis not present

## 2021-03-09 DIAGNOSIS — Z7984 Long term (current) use of oral hypoglycemic drugs: Secondary | ICD-10-CM | POA: Diagnosis not present

## 2021-03-09 DIAGNOSIS — Z20822 Contact with and (suspected) exposure to covid-19: Secondary | ICD-10-CM | POA: Diagnosis not present

## 2021-03-09 DIAGNOSIS — E7800 Pure hypercholesterolemia, unspecified: Secondary | ICD-10-CM | POA: Diagnosis not present

## 2021-03-09 DIAGNOSIS — I63312 Cerebral infarction due to thrombosis of left middle cerebral artery: Secondary | ICD-10-CM | POA: Diagnosis not present

## 2021-03-09 DIAGNOSIS — R531 Weakness: Secondary | ICD-10-CM | POA: Diagnosis not present

## 2021-03-09 DIAGNOSIS — I639 Cerebral infarction, unspecified: Secondary | ICD-10-CM | POA: Diagnosis not present

## 2021-03-09 DIAGNOSIS — Z7982 Long term (current) use of aspirin: Secondary | ICD-10-CM | POA: Insufficient documentation

## 2021-03-09 DIAGNOSIS — F17200 Nicotine dependence, unspecified, uncomplicated: Secondary | ICD-10-CM

## 2021-03-09 DIAGNOSIS — R52 Pain, unspecified: Secondary | ICD-10-CM | POA: Diagnosis not present

## 2021-03-09 DIAGNOSIS — E1165 Type 2 diabetes mellitus with hyperglycemia: Secondary | ICD-10-CM

## 2021-03-09 DIAGNOSIS — E785 Hyperlipidemia, unspecified: Secondary | ICD-10-CM | POA: Diagnosis present

## 2021-03-09 DIAGNOSIS — Z79899 Other long term (current) drug therapy: Secondary | ICD-10-CM | POA: Diagnosis not present

## 2021-03-09 DIAGNOSIS — J449 Chronic obstructive pulmonary disease, unspecified: Secondary | ICD-10-CM | POA: Insufficient documentation

## 2021-03-09 DIAGNOSIS — R200 Anesthesia of skin: Secondary | ICD-10-CM | POA: Diagnosis not present

## 2021-03-09 DIAGNOSIS — R519 Headache, unspecified: Secondary | ICD-10-CM | POA: Diagnosis not present

## 2021-03-09 DIAGNOSIS — I6523 Occlusion and stenosis of bilateral carotid arteries: Secondary | ICD-10-CM | POA: Diagnosis not present

## 2021-03-09 DIAGNOSIS — G459 Transient cerebral ischemic attack, unspecified: Secondary | ICD-10-CM | POA: Diagnosis not present

## 2021-03-09 LAB — COMPREHENSIVE METABOLIC PANEL
ALT: 46 U/L — ABNORMAL HIGH (ref 0–44)
AST: 35 U/L (ref 15–41)
Albumin: 4.4 g/dL (ref 3.5–5.0)
Alkaline Phosphatase: 59 U/L (ref 38–126)
Anion gap: 9 (ref 5–15)
BUN: 9 mg/dL (ref 6–20)
CO2: 21 mmol/L — ABNORMAL LOW (ref 22–32)
Calcium: 8.7 mg/dL — ABNORMAL LOW (ref 8.9–10.3)
Chloride: 105 mmol/L (ref 98–111)
Creatinine, Ser: 0.7 mg/dL (ref 0.44–1.00)
GFR, Estimated: >60 mL/min (ref >60)
Glucose, Bld: 137 mg/dL — ABNORMAL HIGH (ref 70–99)
Potassium: 3.9 mmol/L (ref 3.5–5.1)
Sodium: 135 mmol/L (ref 135–145)
Total Bilirubin: 0.5 mg/dL (ref 0.3–1.2)
Total Protein: 8.5 g/dL — ABNORMAL HIGH (ref 6.5–8.1)

## 2021-03-09 LAB — URINALYSIS, ROUTINE W REFLEX MICROSCOPIC
Bilirubin Urine: NEGATIVE
Glucose, UA: >=500 mg/dL — AB
Ketones, ur: NEGATIVE mg/dL
Leukocytes,Ua: NEGATIVE
Nitrite: NEGATIVE
Specific Gravity, Urine: 1.01 (ref 1.005–1.030)
pH: 5.5 (ref 5.0–8.0)

## 2021-03-09 LAB — DIFFERENTIAL
Abs Immature Granulocytes: 0.06 10*3/uL (ref 0.00–0.07)
Basophils Absolute: 0.1 10*3/uL (ref 0.0–0.1)
Basophils Relative: 1 %
Eosinophils Absolute: 0.2 10*3/uL (ref 0.0–0.5)
Eosinophils Relative: 2 %
Immature Granulocytes: 1 %
Lymphocytes Relative: 29 %
Lymphs Abs: 3.1 10*3/uL (ref 0.7–4.0)
Monocytes Absolute: 0.4 10*3/uL (ref 0.1–1.0)
Monocytes Relative: 4 %
Neutro Abs: 7 10*3/uL (ref 1.7–7.7)
Neutrophils Relative %: 63 %

## 2021-03-09 LAB — ECHOCARDIOGRAM COMPLETE
Area-P 1/2: 3.56 cm2
Calc EF: 69.5 %
Height: 61 in
S' Lateral: 2.2 cm
Single Plane A2C EF: 60.6 %
Single Plane A4C EF: 77.2 %
Weight: 2336 oz

## 2021-03-09 LAB — URINALYSIS, MICROSCOPIC (REFLEX)
Bacteria, UA: NONE SEEN
RBC / HPF: NONE SEEN RBC/hpf (ref 0–5)
Squamous Epithelial / HPF: NONE SEEN (ref 0–5)
WBC, UA: NONE SEEN WBC/hpf (ref 0–5)

## 2021-03-09 LAB — CBC
HCT: 52.9 % — ABNORMAL HIGH (ref 36.0–46.0)
Hemoglobin: 18.1 g/dL — ABNORMAL HIGH (ref 12.0–15.0)
MCH: 32.6 pg (ref 26.0–34.0)
MCHC: 34.2 g/dL (ref 30.0–36.0)
MCV: 95.3 fL (ref 80.0–100.0)
Platelets: 215 10*3/uL (ref 150–400)
RBC: 5.55 MIL/uL — ABNORMAL HIGH (ref 3.87–5.11)
RDW: 13.8 % (ref 11.5–15.5)
WBC: 11 10*3/uL — ABNORMAL HIGH (ref 4.0–10.5)
nRBC: 0 % (ref 0.0–0.2)

## 2021-03-09 LAB — LIPID PANEL
Cholesterol: 170 mg/dL (ref 0–200)
HDL: 33 mg/dL — ABNORMAL LOW (ref >40)
LDL Cholesterol: 109 mg/dL — ABNORMAL HIGH (ref 0–99)
Total CHOL/HDL Ratio: 5.2 RATIO
Triglycerides: 142 mg/dL (ref <150)
VLDL: 28 mg/dL (ref 0–40)

## 2021-03-09 LAB — ETHANOL: Alcohol, Ethyl (B): <10 mg/dL (ref <10)

## 2021-03-09 LAB — RAPID URINE DRUG SCREEN, HOSP PERFORMED
Amphetamines: NOT DETECTED
Barbiturates: NOT DETECTED
Benzodiazepines: NOT DETECTED
Cocaine: NOT DETECTED
Opiates: NOT DETECTED
Tetrahydrocannabinol: POSITIVE — AB

## 2021-03-09 LAB — RESP PANEL BY RT-PCR (FLU A&B, COVID) ARPGX2
Influenza A by PCR: NEGATIVE
Influenza B by PCR: NEGATIVE
SARS Coronavirus 2 by RT PCR: NEGATIVE

## 2021-03-09 LAB — TSH: TSH: 2.552 u[IU]/mL (ref 0.350–4.500)

## 2021-03-09 LAB — HEMOGLOBIN A1C
Hgb A1c MFr Bld: 5.7 % — ABNORMAL HIGH (ref 4.8–5.6)
Mean Plasma Glucose: 116.89 mg/dL

## 2021-03-09 LAB — APTT: aPTT: 38 seconds — ABNORMAL HIGH (ref 24–36)

## 2021-03-09 LAB — PROTIME-INR
INR: 1 (ref 0.8–1.2)
Prothrombin Time: 13.4 seconds (ref 11.4–15.2)

## 2021-03-09 LAB — CBG MONITORING, ED: Glucose-Capillary: 131 mg/dL — ABNORMAL HIGH (ref 70–99)

## 2021-03-09 MED ORDER — AMLODIPINE BESYLATE 10 MG PO TABS
10.0000 mg | ORAL_TABLET | Freq: Every day | ORAL | 3 refills | Status: DC
Start: 2021-03-09 — End: 2022-03-06

## 2021-03-09 MED ORDER — ASPIRIN 81 MG PO TBEC: 81.0000 mg | DELAYED_RELEASE_TABLET | Freq: Every day | ORAL | 11 refills | Status: AC

## 2021-03-09 MED ORDER — ASPIRIN EC 81 MG PO TBEC: 81.0000 mg | DELAYED_RELEASE_TABLET | Freq: Every day | ORAL | Status: DC

## 2021-03-09 MED ORDER — CLOPIDOGREL BISULFATE 75 MG PO TABS
300.0000 mg | ORAL_TABLET | Freq: Once | ORAL | Status: AC
  Administered 2021-03-09: 300 mg via ORAL
  Filled 2021-03-09: qty 4

## 2021-03-09 MED ORDER — CLOPIDOGREL BISULFATE 75 MG PO TABS: 75.0000 mg | ORAL_TABLET | Freq: Every day | ORAL | 0 refills | Status: DC

## 2021-03-09 MED ORDER — METFORMIN HCL ER 500 MG PO TB24: 1000.0000 mg | ORAL_TABLET | Freq: Every day | ORAL | 3 refills | Status: DC

## 2021-03-09 MED ORDER — NICOTINE 21 MG/24HR TD PT24: 21.0000 mg | MEDICATED_PATCH | TRANSDERMAL | 0 refills | Status: AC

## 2021-03-09 MED ORDER — ATORVASTATIN CALCIUM 40 MG PO TABS
80.0000 mg | ORAL_TABLET | Freq: Every day | ORAL | Status: DC
  Administered 2021-03-09: 80 mg via ORAL
  Filled 2021-03-09: qty 2

## 2021-03-09 MED ORDER — ASPIRIN 325 MG PO TABS
325.0000 mg | ORAL_TABLET | Freq: Once | ORAL | Status: AC
  Administered 2021-03-09: 325 mg via ORAL
  Filled 2021-03-09: qty 1

## 2021-03-09 MED ORDER — OMEPRAZOLE 40 MG PO CPDR: 40.0000 mg | DELAYED_RELEASE_CAPSULE | Freq: Every day | ORAL | 6 refills | Status: AC

## 2021-03-09 MED ORDER — IOHEXOL 350 MG/ML SOLN
75.0000 mL | Freq: Once | INTRAVENOUS | Status: AC | PRN
  Administered 2021-03-09: 75 mL via INTRAVENOUS

## 2021-03-09 MED ORDER — HYDROXYZINE HCL 25 MG PO TABS: 25.0000 mg | ORAL_TABLET | Freq: Three times a day (TID) | ORAL | 1 refills | Status: DC | PRN

## 2021-03-09 MED ORDER — CLOPIDOGREL BISULFATE 75 MG PO TABS: 75.0000 mg | ORAL_TABLET | Freq: Every day | ORAL | Status: DC

## 2021-03-09 MED ORDER — LOSARTAN POTASSIUM 50 MG PO TABS: 50.0000 mg | ORAL_TABLET | Freq: Every day | ORAL | 11 refills | Status: DC

## 2021-03-09 MED ORDER — ATORVASTATIN CALCIUM 80 MG PO TABS: 80.0000 mg | ORAL_TABLET | Freq: Every day | ORAL | 11 refills | Status: DC

## 2021-03-09 NOTE — Evaluation (Signed)
Physical Therapy Evaluation Patient Details Name: Emma Glenn MRN: [id number] DOB: 1965/12/25 Today's Date: 03/09/2021  History of Present Illness  56 year old female with history of hypertension, hyperlipidemia, diabetes, smoker, NASH, one-time seizure in 2017 presented to ED for code stroke.     She stated that she went to sleep last night 10:50 PM at her normal baseline.  She woke up between 5 to 5:30 AM found to have right finger weakness, right arm and leg feeling weak.  She also had intermittent right face, arm and leg tingling.  She did not notice any facial droop but stated that her speech has some slurry.  She came to ER for evaluation.  Glucose 131, blood pressure 163/87.     She stated that she had seizure on Wellbutrin in 2017, but no seizure since.  She also diagnosed with NASH in 2017.  She did not take any seizure medication, no blood thin   Clinical Impression  Patient functioning near baseline for functional mobility and gait other than c/o of mild weakness in RLE and some noted in right hand grip without affecting functional mobility and gait.  Patient demonstrates good return for bed mobility, transfers and ambulation in room/hallways without loss of balance.  Plan:  Patient discharged from physical therapy to care of nursing for ambulation daily as tolerated for length of stay.         Recommendations for follow up therapy are one component of a multi-disciplinary discharge planning process, led by the attending physician.  Recommendations may be updated based on patient status, additional functional criteria and insurance authorization.  Follow Up Recommendations Home health PT    Assistance Recommended at Discharge PRN  Patient can return home with the following       Equipment Recommendations None recommended by PT  Recommendations for Other Services       Functional Status Assessment Patient has had a recent decline in their functional status and demonstrates the  ability to make significant improvements in function in a reasonable and predictable amount of time.     Precautions / Restrictions Precautions Precautions: None Restrictions Weight Bearing Restrictions: No      Mobility  Bed Mobility Overal bed mobility: Modified Independent             General bed mobility comments: slightly labored movement    Transfers Overall transfer level: Modified independent Equipment used: None               General transfer comment: slightly labored with no loss of balance    Ambulation/Gait Ambulation/Gait assistance: Modified independent (Device/Increase time) Gait Distance (Feet): 200 Feet Assistive device: None Gait Pattern/deviations: Decreased step length - right, Decreased step length - left, Decreased stride length Gait velocity: slightly decreased     General Gait Details: grossly WFL demonstrating good return for ambulation in room and hallways without loss of balance, on c/o minor weakness in right leg  Stairs            Wheelchair Mobility    Modified Rankin (Stroke Patients Only)       Balance Overall balance assessment: No apparent balance deficits (not formally assessed)                                           Pertinent Vitals/Pain Pain Assessment Pain Assessment: No/denies pain    Home Living Family/patient expects to  be discharged to:: Private residence Living Arrangements: Spouse/significant other Available Help at Discharge: Family;Available 24 hours/day Type of Home: House Home Access: Stairs to enter Entrance Stairs-Rails: Right;Left;Can reach both Entrance Stairs-Number of Steps: 4   Home Layout: One level Home Equipment: BSC/3in1      Prior Function Prior Level of Function : Independent/Modified Independent             Mobility Comments: Community ambulator without AD, drives ADLs Comments: Independent     Hand Dominance   Dominant Hand: Right     Extremity/Trunk Assessment   Upper Extremity Assessment Upper Extremity Assessment: Overall WFL for tasks assessed    Lower Extremity Assessment Lower Extremity Assessment: Overall WFL for tasks assessed    Cervical / Trunk Assessment Cervical / Trunk Assessment: Normal  Communication   Communication: No difficulties  Cognition Arousal/Alertness: Awake/alert Behavior During Therapy: WFL for tasks assessed/performed Overall Cognitive Status: Within Functional Limits for tasks assessed                                          General Comments      Exercises     Assessment/Plan    PT Assessment All further PT needs can be met in the next venue of care  PT Problem List Decreased activity tolerance;Decreased strength;Decreased mobility       PT Treatment Interventions      PT Goals (Current goals can be found in the Care Plan section)  Acute Rehab PT Goals Patient Stated Goal: return home with family to assist PT Goal Formulation: With patient/family Time For Goal Achievement: 03/09/21 Potential to Achieve Goals: Good    Frequency       Co-evaluation               AM-PAC PT "6 Clicks" Mobility  Outcome Measure Help needed turning from your back to your side while in a flat bed without using bedrails?: None Help needed moving from lying on your back to sitting on the side of a flat bed without using bedrails?: None Help needed moving to and from a bed to a chair (including a wheelchair)?: None Help needed standing up from a chair using your arms (e.g., wheelchair or bedside chair)?: None Help needed to walk in hospital room?: A Little Help needed climbing 3-5 steps with a railing? : A Little 6 Click Score: 22    End of Session   Activity Tolerance: Patient tolerated treatment well;Other (comment) Patient left: in chair;with call bell/phone within reach;with family/visitor present Nurse Communication: Mobility status PT Visit Diagnosis:  Unsteadiness on feet (R26.81);Other abnormalities of gait and mobility (R26.89);Muscle weakness (generalized) (M62.81)    Time: 9977-4142 PT Time Calculation (min) (ACUTE ONLY): 20 min   Charges:   PT Evaluation $PT Eval Moderate Complexity: 1 Mod PT Treatments $Therapeutic Activity: 8-22 mins        2:47 PM, 03/09/21 Lonell Grandchild, MPT Physical Therapist with [facility] [phone] office [phone] mobile phone

## 2021-03-09 NOTE — Progress Notes (Signed)
[employer]

## 2021-03-09 NOTE — Progress Notes (Signed)
°  Echocardiogram 2D Echocardiogram has been performed.  Emma Glenn 03/09/2021, 3:10 PM

## 2021-03-09 NOTE — Consult Note (Signed)
[facility] Telemedicine Consult   Requesting Provider: Dr. Roderic Palau  Chief Complaint: right sided weakness and numbness  HPI:   56 year old female with history of hypertension, hyperlipidemia, diabetes, smoker, NASH, one-time seizure in 2017 presented to ED for code stroke.  She stated that she went to sleep last night 10:50 PM at her normal baseline.  She woke up between 5 to 5:30 AM found to have right finger weakness, right arm and leg feeling weak.  She also had intermittent right face, arm and leg tingling.  She did not notice any facial droop but stated that her speech has some slurry.  She came to ER for evaluation.  Glucose 131, blood pressure 163/87.  She stated that she had seizure on Wellbutrin in 2017, but no seizure since.  She also diagnosed with NASH in 2017.  She did not take any seizure medication, no blood thinners.   LKW: 5 - 5:30AM tpa given?: No, outside window IR Thrombectomy? No, no LVO signs Modified Rankin Scale: 0-Completely asymptomatic and back to baseline post- stroke   Exam: Vitals:   03/09/21 0915 03/09/21 0930  BP: (!) 148/91 (!) 166/108  Pulse: 72 73  Resp: 15 18  Temp:    SpO2: 96% 98%     Temp:  [98.1 F (36.7 C)] 98.1 F (36.7 C) (01/27 0836) Pulse Rate:  [69-77] 73 (01/27 0930) Resp:  [14-18] 18 (01/27 0930) BP: (148-166)/(87-108) 166/108 (01/27 0930) SpO2:  [96 %-98 %] 98 % (01/27 0930) Weight:  [66.2 kg] 66.2 kg (01/27 0838)  General - Well nourished, well developed, in no apparent distress.  Ophthalmologic - fundi not visualized due to noncooperation.  Cardiovascular - Regular rhythm and rate.  Neuro - awake, alert, eyes open, orientated to age, place, time and people. No aphasia, fluent language, following all simple commands. Able to name and repeat. No gaze palsy, tracking bilaterally, visual field full, PERRL.  Mild right nasolabial fold flattening. Tongue midline.  Left upper and lower extremity 5/5, right upper  extremity mild pronator drift, right finger grip 4/5.  Right lower extremity 3-/5 proximal, distal 4+/5.  Sensation symmetrical bilaterally, left FTN intact, right finger-to-nose mild dysmetria but not out of proportion to weakness, gait not tested.  Marland Kitchen   NIH Stroke Scale  Level Of Consciousness 0=Alert; keenly responsive 1=Arouse to minor stimulation 2=Requires repeated stimulation to arouse or movements to pain 3=postures or unresponsive 0  LOC Questions to Month and Age 51=Answers both questions correctly 1=Answers one question correctly or dysarthria/intubated/trauma/language barrier 2=Answers neither question correctly or aphasia 0  LOC Commands      -Open/Close eyes     -Open/close grip     -Pantomime commands if communication barrier 0=Performs both tasks correctly 1=Performs one task correctly 2=Performs neighter task correctly 0  Best Gaze     -Only assess horizontal gaze 0=Normal 1=Partial gaze palsy 2=Forced deviation, or total gaze paresis 0  Visual 0=No visual loss 1=Partial hemianopia 2=Complete hemianopia 3=Bilateral hemianopia (blind including cortical blindness) 0  Facial Palsy     -Use grimace if obtunded 0=Normal symmetrical movement 1=Minor paralysis (asymmetry) 2=Partial paralysis (lower face) 3=Complete paralysis (upper and lower face) 1  Motor  0=No drift for 10/5 seconds 1=Drift, but does not hit bed 2=Some antigravity effort, hits  bed 3=No effort against gravity, limb falls 4=No movement 0=Amputation/joint fusion Right Arm 1     Leg 2    Left Arm 0     Leg 0  Limb Ataxia     -  FNT/HTS 0=Absent or does not understand or paralyzed or amputation/joint fusion 1=Present in one limb 2=Present in two limbs 0  Sensory 0=Normal 1=Mild to moderate sensory loss 2=Severe to total sensory loss or coma/unresponsive 0  Best Language 0=No aphasia, normal 1=Mild to moderate aphasia 2=Severe aphasia 3=Mute, global aphasia, or coma/unresponsive 0  Dysarthria  0=Normal 1=Mild to moderate 2=Severe, unintelligible or mute/anarthric 0=intubated/unable to test 1  Extinction/Neglect 0=No abnormality 1=visual/tactile/auditory/spatia/personal inattention/Extinction to bilateral simultaneous stimulation 2=Profound neglect/extinction more than 1 modality  0  Total   5      Imaging Reviewed:  CT HEAD CODE STROKE WO CONTRAST  Result Date: 03/09/2021 CLINICAL DATA:  Code stroke. Neuro deficit, acute, stroke suspected. Right arm pain, numbness, and weakness. EXAM: CT HEAD WITHOUT CONTRAST TECHNIQUE: Contiguous axial images were obtained from the base of the skull through the vertex without intravenous contrast. RADIATION DOSE REDUCTION: This exam was performed according to the departmental dose-optimization program which includes automated exposure control, adjustment of the mA and/or kV according to patient size and/or use of iterative reconstruction technique. COMPARISON:  Head CT and MRI 04/28/2015 FINDINGS: Brain: There is no evidence of an acute infarct, intracranial hemorrhage, mass, midline shift, or extra-axial fluid collection. The ventricles and sulci are normal. Vascular: Calcified atherosclerosis at the skull base. No hyperdense vessel. Skull: No fracture or suspicious osseous lesion. Sinuses/Orbits: Visualized paranasal sinuses and mastoid air cells are clear. Unremarkable orbits. Other: None. ASPECTS [facility] Stroke Program Early CT Score) - Ganglionic level infarction (caudate, lentiform nuclei, internal capsule, insula, M1-M3 cortex): 7 - Supraganglionic infarction (M4-M6 cortex): 3 Total score (0-10 with 10 being normal): 10 IMPRESSION: Unremarkable CT appearance of the brain.  ASPECTS of 10. These results were called by telephone at the time of interpretation on 03/09/2021 at 9:15 am to Dr. Milton Ferguson, who verbally acknowledged these results. Electronically Signed   By: Logan Bores M.D.   On: 03/09/2021 09:15     Labs reviewed in epic and  pertinent values follow: none WBC 11.0, hemoglobin 18.1 creatinine 0.70  Assessment:  56 year old female with history of hypertension, hyperlipidemia, diabetes, smoker, NASH, one-time seizure in 2017 presented to ED right arm and leg weakness and intermittent numbness, right mild facial droop and mild slurred speech.  Last seen well 10:50 PM last night.  She woke up 5 to 5:30 AM with symptoms.  Glucose 131, blood pressure 163/87.  NIH score 5.  CT no acute abnormality.  Patient not at TNK candidate given outside window.  Do not feel MRI needed given no LVO sign.  However will do CTA head and neck stat.  We will continue regular stroke work-up with MRI, 2D echo, LDL and A1c.  Stat aspirin 325 and Plavix 300, followed by aspirin 81 and Plavix 75.  Put on Lipitor 80.  Etiology for patient symptoms concerning for left lacunar infarct.  Aggressive risk factor modification and quit smoking.  Education provided.   Recommendations:  Continue further stroke work up, likely need hospitalist admission Frequent neuro checks Telemetry monitoring MRI brain  CTA head and neck Echocardiogram  UDS, fasting lipid panel and HgbA1C PT/OT/speech consult Permissive hypertension (only treat if BP > 220/120 unless a lower blood pressure is clinically necessary) for 24-48 hours post stroke onset GI and DVT prophylaxis  Patient passed bedside swallow screening, will put on aspirin 325 and Plavix 300, followed by aspirin 81 and Plavix 75.   Put on Lipitor 80. Stroke risk factor modification Quit smoking, education provided Discussed with Dr.  [place] ED physician   Consult Participants: RN, patient, husband, tele stroke RN Location of the provider: [facility] Location of the patient: [facility]  Time Code Stroke Page received: 9:14 AM Time neurologist arrived: 9:17 AM Time NIHSS completed: 9:32 AM  This consult was provided via telemedicine with 2-way video and audio communication. The patient/family was informed that care  would be provided in this way and agreed to receive care in this manner.   This patient is receiving care for possible acute neurological changes. There was 60 minutes of care by this provider at the time of service, including time for direct evaluation via telemedicine, review of medical records, imaging studies and discussion of findings with providers, the patient and/or family.  Rosalin Hawking, MD PhD Stroke Neurology 03/09/2021 9:41 AM

## 2021-03-09 NOTE — Discharge Instructions (Signed)
1)Take Aspirin 81 mg daily along with Plavix 75 mg daily for 30 days then after that STOP the Plavix  and continue ONLY Aspirin 81 mg daily indefinitely-- 2)Avoid ibuprofen/Advil/Aleve/Motrin/Goody Powders/Naproxen/BC powders/Meloxicam/Diclofenac/Indomethacin and other Nonsteroidal anti-inflammatory medications as these will make you more likely to bleed and can cause stomach ulcers, can also cause Kidney problems.  3)Complete Abstinence from Tobacco advised--- ok to use nicotine patch to help you quit smoking 4)Please follow-up with Neurologist Dr. Phillips Odor-- Phone: [phone], Address: [address], [place], [place] [address] in 4 to 6 weeks for recheck and reevaluation.  Please call to make appointment with him 5)-- to reduce your risk for another stroke we strongly advised that you quit smoking, we need to control your blood pressure and diabetes better, and we need to control your cholesterol better 6)-please hold metformin until Sunday, 03/11/2021 due to contrast study done today 7) Please take atorvastatin/Lipitor in place of pravastatin

## 2021-03-09 NOTE — TOC Transition Note (Signed)
Transition of Care [facility]) - CM/SW Discharge Note   Patient Details  Name: Emma Glenn MRN: [id number] Date of Birth: 24-May-1965  [facility] of [facility]) CM/SW Contact:  Boneta Lucks, RN Phone Number: 03/09/2021, 3:25 PM   Clinical Narrative:   [facility] consulted PT discharging from ED, needing HHPT.  Patient is agreeable with [facility]. Marjory Lies accepted the referral. Orders placed and added to AVS.   Final next level of care: [place] Barriers to Discharge: Barriers Resolved   Patient Goals and CMS Choice Patient states their goals for this hospitalization and ongoing recovery are:: to go  home CMS Medicare.gov Compare Post Acute Care list provided to:: Patient Choice offered to / list presented to : Patient  Discharge Placement   Discharge Plan and Services        HH Arranged: PT [facility] Agency: [place] Date [place]: 03/09/21 Time [place]: 1518 Representative spoke with at [place]: Romie Jumper

## 2021-03-09 NOTE — ED Provider Notes (Signed)
[facility] EMERGENCY DEPARTMENT Provider Note   CSN: [id number] Arrival date & time: 03/09/21  6222  An emergency department physician performed an initial assessment on this suspected stroke patient at 34.  History  Chief Complaint  Patient presents with   Extremity Weakness    Emma Glenn is a 56 y.o. female.  Patient woke up this morning with weakness on her right arm and right leg.  She states she went to bed around 10 and felt fine.  Patient has a history of hypertension and COPD.  The history is provided by the patient and medical records. No language interpreter was used.  Extremity Weakness This is a new problem. The current episode started 6 to 12 hours ago. The problem occurs constantly. The problem has not changed since onset.Pertinent negatives include no chest pain, no abdominal pain and no headaches. Nothing aggravates the symptoms. Nothing relieves the symptoms. She has tried nothing for the symptoms. The treatment provided no relief.      Home Medications Prior to Admission medications   Medication Sig Start Date End Date Taking? Authorizing Provider  amLODipine (NORVASC) 5 MG tablet Take 1 tablet (5 mg total) by mouth daily. 11/30/20  Yes Philemon Kingdom, MD  aspirin 81 MG EC tablet Take 81 mg by mouth daily. Swallow whole.   Yes [provider]  BEVESPI AEROSPHERE 9-4.8 MCG/ACT AERO INHALE 2 PUFFS INTO THE LUNGS TWICE DAILY. REPLACES ANORO INHALER. Patient taking differently: Inhale 2 puffs into the lungs in the morning and at bedtime. 07/16/17  Yes Mannam, Praveen, MD  empagliflozin (JARDIANCE) 10 MG TABS tablet Take 1 tablet (10 mg total) by mouth daily. 04/19/20  Yes Philemon Kingdom, MD  levothyroxine (SYNTHROID) 125 MCG tablet Take 1 tablet (125 mcg total) by mouth daily. 04/29/20  Yes Philemon Kingdom, MD  losartan (COZAAR) 50 MG tablet Take 1 tablet (50 mg total) by mouth daily. 10/24/20  Yes Philemon Kingdom, MD  metFORMIN (GLUCOPHAGE-XR) 500  MG 24 hr tablet Take 2 tablets (1,000 mg total) by mouth daily. 04/19/20  Yes Philemon Kingdom, MD  omeprazole (PRILOSEC) 40 MG capsule Take 1 capsule (40 mg total) by mouth daily. Patient taking differently: Take 40 mg by mouth daily as needed (acid reflux). 08/18/17  Yes Pyrtle, Lajuan Lines, MD  pravastatin (PRAVACHOL) 80 MG tablet Take 1 tablet (80 mg total) by mouth daily. 03/08/21  Yes Nahser, Wonda Cheng, MD  glucose blood (BAYER CONTOUR TEST) test strip Use 2x a day 07/01/16   Philemon Kingdom, MD  SURE COMFORT PEN NEEDLES 32G X 4 MM MISC USE TO INJECT LANTUS ONCE DAILY 09/09/18   Philemon Kingdom, MD      Allergies    Doxycycline, Nitrofurantoin monohyd macro, Septra [sulfamethoxazole-trimethoprim], and Lamictal [lamotrigine]    Review of Systems   Review of Systems  Constitutional:  Negative for appetite change and fatigue.  HENT:  Negative for congestion, ear discharge and sinus pressure.   Eyes:  Negative for discharge.  Respiratory:  Negative for cough.   Cardiovascular:  Negative for chest pain.  Gastrointestinal:  Negative for abdominal pain and diarrhea.  Genitourinary:  Negative for frequency and hematuria.  Musculoskeletal:  Positive for extremity weakness. Negative for back pain.       Weakness in the right arm right leg  Skin:  Negative for rash.  Neurological:  Negative for seizures and headaches.  Psychiatric/Behavioral:  Negative for hallucinations.    Physical Exam Updated Vital Signs BP 139/85    Pulse 70  Temp 98.1 F (36.7 C) (Oral)    Resp 16    Ht 5' 1"  (1.549 m)    Wt 66.2 kg    LMP 06/23/2016    SpO2 97%    BMI 27.59 kg/m  Physical Exam Vitals and nursing note reviewed.  Constitutional:      Appearance: She is well-developed.  HENT:     Head: Normocephalic.     Nose: Nose normal.  Eyes:     General: No scleral icterus.    Conjunctiva/sclera: Conjunctivae normal.  Neck:     Thyroid: No thyromegaly.  Cardiovascular:     Rate and Rhythm: Normal rate and  regular rhythm.     Heart sounds: No murmur heard.   No friction rub. No gallop.  Pulmonary:     Breath sounds: No stridor. No wheezing or rales.  Chest:     Chest wall: No tenderness.  Abdominal:     General: There is no distension.     Tenderness: There is no abdominal tenderness. There is no rebound.  Musculoskeletal:     Cervical back: Neck supple.     Comments: Moderate weakness in right arm and right leg  Lymphadenopathy:     Cervical: No cervical adenopathy.  Skin:    Findings: No erythema or rash.  Neurological:     Mental Status: She is alert and oriented to person, place, and time.     Motor: No abnormal muscle tone.     Coordination: Coordination normal.  Psychiatric:        Behavior: Behavior normal.    ED Results / Procedures / Treatments   Labs (all labs ordered are listed, but only abnormal results are displayed) Labs Reviewed  APTT - Abnormal; Notable for the following components:      Result Value   aPTT 38 (*)    All other components within normal limits  CBC - Abnormal; Notable for the following components:   WBC 11.0 (*)    RBC 5.55 (*)    Hemoglobin 18.1 (*)    HCT 52.9 (*)    All other components within normal limits  COMPREHENSIVE METABOLIC PANEL - Abnormal; Notable for the following components:   CO2 21 (*)    Glucose, Bld 137 (*)    Calcium 8.7 (*)    Total Protein 8.5 (*)    ALT 46 (*)    All other components within normal limits  RAPID URINE DRUG SCREEN, HOSP PERFORMED - Abnormal; Notable for the following components:   Tetrahydrocannabinol POSITIVE (*)    All other components within normal limits  URINALYSIS, ROUTINE W REFLEX MICROSCOPIC - Abnormal; Notable for the following components:   Glucose, UA >=500 (*)    Hgb urine dipstick SMALL (*)    Protein, ur TRACE (*)    All other components within normal limits  LIPID PANEL - Abnormal; Notable for the following components:   HDL 33 (*)    LDL Cholesterol 109 (*)    All other  components within normal limits  CBG MONITORING, ED - Abnormal; Notable for the following components:   Glucose-Capillary 131 (*)    All other components within normal limits  RESP PANEL BY RT-PCR (FLU A&B, COVID) ARPGX2  ETHANOL  PROTIME-INR  DIFFERENTIAL  URINALYSIS, MICROSCOPIC (REFLEX)  HEMOGLOBIN A1C  I-STAT CHEM 8, ED  POC URINE PREG, ED    EKG None  Radiology CT ANGIO HEAD NECK W WO CM  Result Date: 03/09/2021 CLINICAL DATA:  Stroke/TIA, determine embolic  source. Right arm pain, numbness, and weakness. EXAM: CT ANGIOGRAPHY HEAD AND NECK TECHNIQUE: Multidetector CT imaging of the head and neck was performed using the standard protocol during bolus administration of intravenous contrast. Multiplanar CT image reconstructions and MIPs were obtained to evaluate the vascular anatomy. Carotid stenosis measurements (when applicable) are obtained utilizing NASCET criteria, using the distal internal carotid diameter as the denominator. RADIATION DOSE REDUCTION: This exam was performed according to the departmental dose-optimization program which includes automated exposure control, adjustment of the mA and/or kV according to patient size and/or use of iterative reconstruction technique. CONTRAST:  75m OMNIPAQUE IOHEXOL 350 MG/ML SOLN COMPARISON:  None. FINDINGS: CTA NECK FINDINGS Aortic arch: Standard 3 vessel aortic arch. No evidence of significant arch vessel origin stenosis. Right carotid system: Patent with a small amount of calcified and soft plaque in the carotid bulb. No evidence of a significant stenosis or dissection. Left carotid system: Patent with a small amount of calcified plaque in the carotid bulb. No evidence of a significant stenosis or dissection. Vertebral arteries: Patent with calcified plaque at the right vertebral artery origin resulting in mild stenosis. Minimal nonstenotic plaque in the left V3 segment. Mildly dominant left vertebral artery. Skeleton: Moderate cervical disc  and facet degeneration. Other neck: No evidence of cervical lymphadenopathy or mass. Upper chest: Clear lung apices. Review of the MIP images confirms the above findings CTA HEAD FINDINGS Anterior circulation: The internal carotid arteries are patent from skull base to carotid termini with atherosclerotic plaque resulting in bilateral cavernous and paraclinoid stenoses, mild-to-moderate on the right and mild on the left. ACAs and MCAs are patent with mild branch vessel irregularity but no evidence of a proximal branch occlusion or significant proximal stenosis. No aneurysm is identified. Posterior circulation: The intracranial vertebral arteries are widely patent to the basilar. The right PICA and left AICA appear dominant. Patent SCAs are seen bilaterally. The basilar artery is widely patent. Both PCAs are patent without evidence of a significant proximal stenosis. No aneurysm is identified. Venous sinuses: Patent. Hypoplastic right transverse and sigmoid sinuses with an arachnoid granulation near the transverse-sigmoid junction. Anatomic variants: None. Review of the MIP images confirms the above findings IMPRESSION: 1. No large vessel occlusion. 2. Intracranial atherosclerosis with mild-to-moderate right and mild left ICA stenoses. 3. Mild right vertebral artery origin stenosis. 4. Mild cervical carotid artery atherosclerosis without significant stenosis. Electronically Signed   By: ALogan BoresM.D.   On: 03/09/2021 10:47   MR BRAIN WO CONTRAST  Result Date: 03/09/2021 CLINICAL DATA:  Stroke, follow-up. Right arm pain, numbness, and weakness. EXAM: MRI HEAD WITHOUT CONTRAST TECHNIQUE: Multiplanar, multiecho pulse sequences of the brain and surrounding structures were obtained without intravenous contrast. COMPARISON:  Head CT 03/09/2021 and MRI 04/27/2068 FINDINGS: Brain: There is a small acute infarct involving the posterior aspect of the left lentiform nucleus and corona radiata. No intracranial  hemorrhage, mass, midline shift, or extra-axial fluid collection is identified. The ventricles and sulci are normal. No significant chronic white matter disease is evident. Vascular: More fully evaluated on the immediately preceding CTA. Skull and upper cervical spine: No suspicious marrow lesion. Sinuses/Orbits: Unremarkable orbits. Paranasal sinuses and mastoid air cells are clear. Other: None. IMPRESSION: Small acute infarct in the posterior left basal ganglia. Electronically Signed   By: ALogan BoresM.D.   On: 03/09/2021 10:53   CT HEAD CODE STROKE WO CONTRAST  Result Date: 03/09/2021 CLINICAL DATA:  Code stroke. Neuro deficit, acute, stroke suspected. Right arm pain, numbness,  and weakness. EXAM: CT HEAD WITHOUT CONTRAST TECHNIQUE: Contiguous axial images were obtained from the base of the skull through the vertex without intravenous contrast. RADIATION DOSE REDUCTION: This exam was performed according to the departmental dose-optimization program which includes automated exposure control, adjustment of the mA and/or kV according to patient size and/or use of iterative reconstruction technique. COMPARISON:  Head CT and MRI 04/28/2015 FINDINGS: Brain: There is no evidence of an acute infarct, intracranial hemorrhage, mass, midline shift, or extra-axial fluid collection. The ventricles and sulci are normal. Vascular: Calcified atherosclerosis at the skull base. No hyperdense vessel. Skull: No fracture or suspicious osseous lesion. Sinuses/Orbits: Visualized paranasal sinuses and mastoid air cells are clear. Unremarkable orbits. Other: None. ASPECTS [facility] Stroke Program Early CT Score) - Ganglionic level infarction (caudate, lentiform nuclei, internal capsule, insula, M1-M3 cortex): 7 - Supraganglionic infarction (M4-M6 cortex): 3 Total score (0-10 with 10 being normal): 10 IMPRESSION: Unremarkable CT appearance of the brain.  ASPECTS of 10. These results were called by telephone at the time of  interpretation on 03/09/2021 at 9:15 am to Dr. Milton Ferguson, who verbally acknowledged these results. Electronically Signed   By: Logan Bores M.D.   On: 03/09/2021 09:15    Procedures Procedures    Medications Ordered in ED Medications  aspirin tablet 325 mg (325 mg Oral Given 03/09/21 0947)    Followed by  aspirin EC tablet 81 mg (has no administration in time range)  clopidogrel (PLAVIX) tablet 300 mg (300 mg Oral Given 03/09/21 0947)    Followed by  clopidogrel (PLAVIX) tablet 75 mg (has no administration in time range)  atorvastatin (LIPITOR) tablet 80 mg (80 mg Oral Given 03/09/21 0947)  iohexol (OMNIPAQUE) 350 MG/ML injection 75 mL (75 mLs Intravenous Contrast Given 03/09/21 1000)  CRITICAL CARE Performed by: Milton Ferguson Total critical care time: 40 minutes Critical care time was exclusive of separately billable procedures and treating other patients. Critical care was necessary to treat or prevent imminent or life-threatening deterioration. Critical care was time spent personally by me on the following activities: development of treatment plan with patient and/or surrogate as well as nursing, discussions with consultants, evaluation of patient's response to treatment, examination of patient, obtaining history from patient or surrogate, ordering and performing treatments and interventions, ordering and review of laboratory studies, ordering and review of radiographic studies, pulse oximetry and re-evaluation of patient's condition.   ED Course/ Medical Decision Making/ A&P                           Medical Decision Making Amount and/or Complexity of Data Reviewed Labs: ordered. Radiology: ordered.  Risk Decision regarding hospitalization.   Patient with acute stroke seen on MRI.  Patient will be admitted by medicine with neurology consult    This patient presents to the ED for concern of weakness in right arm and right leg, this involves an extensive number of treatment  options, and is a complaint that carries with it a high risk of complications and morbidity.  The differential diagnosis includes stroke, cervical disease   Co morbidities that complicate the patient evaluation  COPD hypertension   Additional history obtained:  Additional history obtained from significant other External records from outside source obtained and reviewed including hospital record   Lab Tests:  I Ordered, and personally interpreted labs.  The pertinent results include: CBC and chemistries that showed elevated white count and mild elevated glucose   Imaging Studies ordered:  I ordered imaging studies including CT head CT angio head neck and MRI of head I independently visualized and interpreted imaging which showed small acute infarct in the posterior left basal ganglia I agree with the radiologist interpretation   Cardiac Monitoring:  The patient was maintained on a cardiac monitor.  I personally viewed and interpreted the cardiac monitored which showed an underlying rhythm of: Normal sinus rhythm   Medicines ordered and prescription drug management:  I ordered medication including aspirin for stroke Reevaluation of the patient after these medicines showed that the patient improved I have reviewed the patients home medicines and have made adjustments as needed   Test Considered:  Echo   Critical Interventions:  Code stroke called   Consultations Obtained:  I requested consultation with the neurology,  and discussed lab and imaging findings as well as pertinent plan - they recommend: Admission for stroke by hospitalist   Problem List / ED Course: Stroke COPD   Reevaluation:  After the interventions noted above, I reevaluated the patient and found that they have :improved   Social Determinants of Health:  None   Dispostion:  After consideration of the diagnostic results and the patients response to treatment, I feel that the patent  would benefit from admission.         Final Clinical Impression(s) / ED Diagnoses Final diagnoses:  Acute CVA (cerebrovascular accident) [facility])    Rx / [place] Orders ED Discharge Orders     None         Milton Ferguson, MD 03/10/21 [phone]

## 2021-03-09 NOTE — ED Triage Notes (Addendum)
Bib ems for R arm pain and numbness at 5 am.  Had to have assistence to bathroom.  Reports pain and numbness went away.  Then numbness returned to whole Right side of body and went away.  Approx 0815 Right numbness started again.  The numbness went away again.  At presnet no numbness to R side.  Resp even and unlabored.  22 ga to L wrist.  bgl 149.  Pt sates that she normally gets around without assistance.  Pt reports feeling weaker on right side.  Noted increased weakness to R leg.  Pt reports notably weaker on R side since waking this morning.

## 2021-03-09 NOTE — Consult Note (Signed)
Patient Demographics:    Emma Glenn, is a 56 y.o. female  MRN: [id number]   DOB - [birth year]  Admit Date - 03/09/2021  Outpatient Primary MD for the patient is Shirline Frees, MD   Assessment & Plan:    Principal Problem:   Acute CVA (cerebrovascular accident) [facility]) Active Problems:   Hypothyroidism   Hypertension, essential   Type 2 diabetes mellitus with hyperglycemia, without long-term current use of insulin ([place])   Hyperlipidemia    1) Small acute infarct in the posterior left basal ganglia--- Patient presented with right hand finger weakness, numbness right upper extremity right lower extremity weakness as well as facial numbness and tingling on the right side of the face, and concerns about slurred speech -Patient woke up around 5 AM with above symptoms -Last known well 10:50 PM on 03/08/2021 --MRI brain with Small acute infarct in the posterior left basal ganglia -CTA head and neck without LVO, patient does have intracranial atherosclerosis with mild to moderate read in mild left ICA stenosis -Echo with EF of 65 to 70% no regional wall motion normalities, diastolic parameters were normal, no aortic stenosis, no atrial enlargement -UDS with THC -A1c is 5.7 -LDL is 109, HDL is 33 -TSH pending -PTA patient was cutting 20 mg daily she was supposed to increase the pravastatin to 80 mg daily but she has not started the new dose -Okay to switch to Lipitor 80 mg daily for secondary stroke prophylaxis - take Aspirin 81 mg daily along with Plavix 75 mg daily for 21 days then after that STOP the Plavix  and continue ONLY Aspirin 81 mg daily indefinitely--for secondary stroke Prevention (Per The multicenter SAMMPRIS trial) -Outpatient neurology follow-up in 1 month advised -Abstinence from tobacco strongly  advised -PT eval appreciated recommends home health PT   2)DM2--A1c 5.7 reflecting excellent diabetic control PTA -Hold metformin for 48 hours due to contrast study -Continue Jardiance  3) tobacco abuse--- smoking cessation strongly advised patient advised to use over-the-counter nicotine patch  4)HTN--stage II, BP is not under control, amlodipine 10 mg and losartan advised to follow-up with neurologist in 1 month for BP recheck  5) hypothyroidism--continue levothyroxine, repeat TSH pending    Disposition-discharge home with discharge instructions as below -1)Take Aspirin 81 mg daily along with Plavix 75 mg daily for 30 days then after that STOP the Plavix  and continue ONLY Aspirin 81 mg daily indefinitely-- 2)Avoid ibuprofen/Advil/Aleve/Motrin/Goody Powders/Naproxen/BC powders/Meloxicam/Diclofenac/Indomethacin and other Nonsteroidal anti-inflammatory medications as these will make you more likely to bleed and can cause stomach ulcers, can also cause Kidney problems.  3)Complete Abstinence from Tobacco advised--- ok to use nicotine patch to help you quit smoking 4)Please follow-up with Neurologist Dr. Phillips Odor-- Phone: [phone], Address: [address], [place], [place] [address] in 4 to 6 weeks for recheck and reevaluation.  Please call to make appointment with him 5)-- to reduce your risk for another stroke we strongly advised that  you quit smoking, we need to control your blood pressure and diabetes better, and we need to control your cholesterol better 6)-Please hold metformin until Sunday, 03/11/2021 due to contrast study done today 7) Please take atorvastatin/Lipitor in place of pravastatin  Dispo: The patient is from: Home              Anticipated d/c is to: Home with [place]            With History of - Reviewed by me  Past Medical History:  Diagnosis Date   Acute stress reaction    Allergic rhinitis    Anxiety    Autoimmune thyroiditis    CIN I (cervical  intraepithelial neoplasia I)    Cirrhosis of liver (HCC)    COPD (chronic obstructive pulmonary disease) (HCC)    Depression    DM (diabetes mellitus) ([place])    GERD (gastroesophageal reflux disease)    Hemochromatosis    Hepatic steatosis    Hereditary hemochromatosis ([place])    Hyperlipidemia    Hypertension    Hypothyroidism    Labial cyst    Inclusion cyst   Liver cirrhosis (HCC)    Morbid obesity (HCC)    NASH (nonalcoholic steatohepatitis)    Positive ANA (antinuclear antibody)    Pure hypercholesterolemia    Seizures ([place])    last seizure 3/17? over dose wellbutrin   Thyroid disease    Graves dis-Radioactive Iodine   Tobacco dependence       Past Surgical History:  Procedure Laterality Date   CERVICAL BIOPSY  W/ LOOP ELECTRODE EXCISION     Excision labial inclusion cyst   CESAREAN SECTION     COLPOSCOPY     HAND SURGERY     SHOULDER ARTHROSCOPY WITH OPEN ROTATOR CUFF REPAIR AND DISTAL CLAVICLE ACROMINECTOMY Left 06/13/2015   Procedure: LEFT SHOULDER ARTHROSCOPY, DEBRIDEMENT, OPEN ROTATOR CUFF REPAIR, CORACOID FRACTURE FIXATION, BICEPS TENODESIS;  Surgeon: Meredith Pel, MD;  Location: [facility];  Service: Orthopedics;  Laterality: Left;   SHOULDER CLOSED REDUCTION Left 09/26/2015   Procedure: CLOSED MANIPULATION SHOULDER UNDER ANESTHESIA;  Surgeon: Meredith Pel, MD;  Location: [facility];  Service: Orthopedics;  Laterality: Left;   TUBAL LIGATION      Chief Complaint  Patient presents with   Extremity Weakness      HPI:    Emma Glenn  is a 56 y.o. female with pmhx relevant for HTN, DM2, HLD, atherosclerosis, hypothyroidism and prior history of seizures, NASH, as well as history of tobacco abuse who presents to the ED with concerns of right hand finger weakness, numbness right upper extremity right lower extremity weakness as well as facial numbness and tingling on the right side of the face, and concerns about slurred speech -Patient woke up around 5 AM with above  symptoms -Last known well 10:50 PM on 03/08/2021 -Additional history obtained from patient's husband at bedside -In the ED patient is noted to have elevated BP -No seizure type activity or concerns -MRI brain with Small acute infarct in the posterior left basal ganglia -CTA head and neck without LVO, patient does have intracranial atherosclerosis with mild to moderate read in mild left ICA stenosis -Echo with EF of 65 to 70% no regional wall motion normalities, diastolic parameters were normal, no aortic stenosis, no atrial enlargement -UDS with THC -A1c is 5.7 -LDL is 109, HDL is 33 -Creatinine 0.70 -WBC 11.0 hemoglobin 18.1 platelets 215 -Telemetry neuro consult in the ED appreciated   Review of systems:  In addition to the HPI above,   A full Review of  Systems was done, all other systems reviewed are negative except as noted above in HPI , .    Social History:  Reviewed by me    Social History   Tobacco Use   Smoking status: Every Day    Packs/day: 1.00    Types: Cigarettes   Smokeless tobacco: Never  Substance Use Topics   Alcohol use: Yes    Alcohol/week: 0.0 standard drinks    Comment: rare       Family History :  Reviewed by me    Family History  Problem Relation Age of Onset   Hypertension Mother    Heart disease Mother    Diabetes Father    Heart disease Father    Heart disease Brother    Breast cancer Maternal Grandmother    Uterine cancer Maternal Aunt    Seizures Neg Hx    Stomach cancer Neg Hx    Colon cancer Neg Hx      Home Medications:   Prior to Admission medications   Medication Sig Start Date End Date Taking? Authorizing Provider  BEVESPI AEROSPHERE 9-4.8 MCG/ACT AERO INHALE 2 PUFFS INTO THE LUNGS TWICE DAILY. REPLACES ANORO INHALER. Patient taking differently: Inhale 2 puffs into the lungs in the morning and at bedtime. 07/16/17  Yes Mannam, Praveen, MD  clopidogrel (PLAVIX) 75 MG tablet Take 1 tablet (75 mg total) by mouth daily.  take Aspirin 81 mg daily along with Plavix 75 mg daily for 30 days then after that STOP the Plavix  and continue ONLY Aspirin 81 mg daily indefinitely-- 03/09/21 03/09/22 Yes Marlis Oldaker, MD  empagliflozin (JARDIANCE) 10 MG TABS tablet Take 1 tablet (10 mg total) by mouth daily. 04/19/20  Yes Philemon Kingdom, MD  levothyroxine (SYNTHROID) 125 MCG tablet Take 1 tablet (125 mcg total) by mouth daily. 04/29/20  Yes Philemon Kingdom, MD  nicotine (NICODERM CQ - DOSED IN MG/24 HOURS) 21 mg/24hr patch Place 1 patch (21 mg total) onto the skin daily for 28 days. 03/09/21 04/06/21 Yes Sharron Simpson, MD  amLODipine (NORVASC) 10 MG tablet Take 1 tablet (10 mg total) by mouth daily. 03/09/21   Roxan Hockey, MD  aspirin EC 81 MG EC tablet Take 1 tablet (81 mg total) by mouth daily with breakfast. take Aspirin 81 mg daily along with Plavix 75 mg daily for 30 days then after that STOP the Plavix  and continue ONLY Aspirin 81 mg daily indefinitely-- 03/09/21   Roxan Hockey, MD  atorvastatin (LIPITOR) 80 MG tablet Take 1 tablet (80 mg total) by mouth daily. Take in place of Pravastatin 03/10/21   Roxan Hockey, MD  glucose blood (BAYER CONTOUR TEST) test strip Use 2x a day 07/01/16   Philemon Kingdom, MD  losartan (COZAAR) 50 MG tablet Take 1 tablet (50 mg total) by mouth daily. 03/09/21   Roxan Hockey, MD  metFORMIN (GLUCOPHAGE-XR) 500 MG 24 hr tablet Take 2 tablets (1,000 mg total) by mouth daily. Restart on Sunday 03/11/21 03/09/21   Roxan Hockey, MD  omeprazole (PRILOSEC) 40 MG capsule Take 1 capsule (40 mg total) by mouth daily. 03/09/21   Roxan Hockey, MD  SURE COMFORT PEN NEEDLES 32G X 4 MM MISC USE TO INJECT LANTUS ONCE DAILY 09/09/18   Philemon Kingdom, MD     Allergies:     Allergies  Allergen Reactions   Doxycycline Other (See Comments)    Flu like symptoms (high fever, chills)  Nitrofurantoin Monohyd Macro Other (See Comments)    Flu like symptoms (high fever, chills)   Septra  [Sulfamethoxazole-Trimethoprim] Other (See Comments)    Flu like symptoms (high fever, chills)   Lamictal [Lamotrigine] Rash   Allergies as of 03/09/2021       Reactions   Doxycycline Other (See Comments)   Flu like symptoms (high fever, chills)   Nitrofurantoin Monohyd Macro Other (See Comments)   Flu like symptoms (high fever, chills)   Septra [sulfamethoxazole-trimethoprim] Other (See Comments)   Flu like symptoms (high fever, chills)   Lamictal [lamotrigine] Rash        Medication List     STOP taking these medications    pravastatin 80 MG tablet Commonly known as: PRAVACHOL       TAKE these medications    amLODipine 10 MG tablet Commonly known as: NORVASC Take 1 tablet (10 mg total) by mouth daily. What changed:  medication strength how much to take   aspirin 81 MG EC tablet Take 1 tablet (81 mg total) by mouth daily with breakfast. take Aspirin 81 mg daily along with Plavix 75 mg daily for 30 days then after that STOP the Plavix  and continue ONLY Aspirin 81 mg daily indefinitely-- What changed:  when to take this additional instructions   atorvastatin 80 MG tablet Commonly known as: LIPITOR Take 1 tablet (80 mg total) by mouth daily. Take in place of Pravastatin Start taking on: March 10, 2021   Bevespi Aerosphere 9-4.8 MCG/ACT Aero Generic drug: Glycopyrrolate-Formoterol INHALE 2 PUFFS INTO THE LUNGS TWICE DAILY. REPLACES ANORO INHALER. What changed: See the new instructions.   clopidogrel 75 MG tablet Commonly known as: Plavix Take 1 tablet (75 mg total) by mouth daily. take Aspirin 81 mg daily along with Plavix 75 mg daily for 30 days then after that STOP the Plavix  and continue ONLY Aspirin 81 mg daily indefinitely--   empagliflozin 10 MG Tabs tablet Commonly known as: Jardiance Take 1 tablet (10 mg total) by mouth daily.   glucose blood test strip Commonly known as: Estate manager/land agent Use 2x a day   hydrOXYzine 25 MG tablet Commonly  known as: ATARAX Take 1 tablet (25 mg total) by mouth 3 (three) times daily as needed for anxiety. Causes drowsiness---No driving   levothyroxine 125 MCG tablet Commonly known as: SYNTHROID Take 1 tablet (125 mcg total) by mouth daily.   losartan 50 MG tablet Commonly known as: COZAAR Take 1 tablet (50 mg total) by mouth daily.   metFORMIN 500 MG 24 hr tablet Commonly known as: GLUCOPHAGE-XR Take 2 tablets (1,000 mg total) by mouth daily. Restart on Sunday 03/11/21 What changed: additional instructions   nicotine 21 mg/24hr patch Commonly known as: NICODERM CQ - dosed in mg/24 hours Place 1 patch (21 mg total) onto the skin daily for 28 days.   omeprazole 40 MG capsule Commonly known as: PRILOSEC Take 1 capsule (40 mg total) by mouth daily. What changed:  when to take this reasons to take this   Sure Comfort Pen Needles 32G X 4 MM Misc Generic drug: Insulin Pen Needle USE TO INJECT LANTUS ONCE DAILY          Physical Exam:   Vitals  Blood pressure (!) 142/90, pulse 67, temperature 98.1 F (36.7 C), temperature source Oral, resp. rate 18, height 5' 1"  (1.549 m), weight 66.2 kg, last menstrual period 06/23/2016, SpO2 96 %.  Physical Examination: General appearance - alert, and in no distress Mental  status - alert, oriented to person, place, and time,  Eyes - sclera anicteric Neck - supple, no JVD elevation , Chest - clear  to auscultation bilaterally, symmetrical air movement,  Heart - S1 and S2 normal, regular  Abdomen - soft, nontender, nondistended, no masses or organomegaly Neurological - screening mental status exam normal, neck supple without rigidity, right-sided weakness and numbness mostly resolving, facial numbness resolved, gait steady Extremities - no pedal edema noted, intact peripheral pulses  Skin - warm, dry     Data Review:    CBC Recent Labs  Lab 03/09/21 0854  WBC 11.0*  HGB 18.1*  HCT 52.9*  PLT 215  MCV 95.3  MCH 32.6  MCHC 34.2   RDW 13.8  LYMPHSABS 3.1  MONOABS 0.4  EOSABS 0.2  BASOSABS 0.1   ------------------------------------------------------------------------------------------------------------------  Chemistries  Recent Labs  Lab 03/09/21 0854  NA 135  K 3.9  CL 105  CO2 21*  GLUCOSE 137*  BUN 9  CREATININE 0.70  CALCIUM 8.7*  AST 35  ALT 46*  ALKPHOS 59  BILITOT 0.5   ------------------------------------------------------------------------------------------------------------------ estimated creatinine clearance is 67.6 mL/min (by C-G formula based on SCr of 0.7 mg/dL). ------------------------------------------------------------------------------------------------------------------ No results for input(s): TSH, T4TOTAL, T3FREE, THYROIDAB in the last 72 hours.  Invalid input(s): FREET3   Coagulation profile Recent Labs  Lab 03/09/21 0854  INR 1.0    Urinalysis    Component Value Date/Time   COLORURINE YELLOW 03/09/2021 [place] 03/09/2021 0854   LABSPEC 1.010 03/09/2021 0854   PHURINE 5.5 03/09/2021 0854   GLUCOSEU >=500 (A) 03/09/2021 0854   HGBUR SMALL (A) 03/09/2021 0854   BILIRUBINUR NEGATIVE 03/09/2021 0854   KETONESUR NEGATIVE 03/09/2021 0854   PROTEINUR TRACE (A) 03/09/2021 0854   UROBILINOGEN 0.2 04/04/2011 1553   NITRITE NEGATIVE 03/09/2021 0854   LEUKOCYTESUR NEGATIVE 03/09/2021 0854    ----------------------------------------------------------------------------------------------------------------   Imaging Results:    CT ANGIO HEAD NECK W WO CM  Result Date: 03/09/2021 CLINICAL DATA:  Stroke/TIA, determine embolic source. Right arm pain, numbness, and weakness. EXAM: CT ANGIOGRAPHY HEAD AND NECK TECHNIQUE: Multidetector CT imaging of the head and neck was performed using the standard protocol during bolus administration of intravenous contrast. Multiplanar CT image reconstructions and MIPs were obtained to evaluate the vascular anatomy.  Carotid stenosis measurements (when applicable) are obtained utilizing NASCET criteria, using the distal internal carotid diameter as the denominator. RADIATION DOSE REDUCTION: This exam was performed according to the departmental dose-optimization program which includes automated exposure control, adjustment of the mA and/or kV according to patient size and/or use of iterative reconstruction technique. CONTRAST:  65m OMNIPAQUE IOHEXOL 350 MG/ML SOLN COMPARISON:  None. FINDINGS: CTA NECK FINDINGS Aortic arch: Standard 3 vessel aortic arch. No evidence of significant arch vessel origin stenosis. Right carotid system: Patent with a small amount of calcified and soft plaque in the carotid bulb. No evidence of a significant stenosis or dissection. Left carotid system: Patent with a small amount of calcified plaque in the carotid bulb. No evidence of a significant stenosis or dissection. Vertebral arteries: Patent with calcified plaque at the right vertebral artery origin resulting in mild stenosis. Minimal nonstenotic plaque in the left V3 segment. Mildly dominant left vertebral artery. Skeleton: Moderate cervical disc and facet degeneration. Other neck: No evidence of cervical lymphadenopathy or mass. Upper chest: Clear lung apices. Review of the MIP images confirms the above findings CTA HEAD FINDINGS Anterior circulation: The internal carotid arteries are patent from skull base  to carotid termini with atherosclerotic plaque resulting in bilateral cavernous and paraclinoid stenoses, mild-to-moderate on the right and mild on the left. ACAs and MCAs are patent with mild branch vessel irregularity but no evidence of a proximal branch occlusion or significant proximal stenosis. No aneurysm is identified. Posterior circulation: The intracranial vertebral arteries are widely patent to the basilar. The right PICA and left AICA appear dominant. Patent SCAs are seen bilaterally. The basilar artery is widely patent. Both PCAs  are patent without evidence of a significant proximal stenosis. No aneurysm is identified. Venous sinuses: Patent. Hypoplastic right transverse and sigmoid sinuses with an arachnoid granulation near the transverse-sigmoid junction. Anatomic variants: None. Review of the MIP images confirms the above findings IMPRESSION: 1. No large vessel occlusion. 2. Intracranial atherosclerosis with mild-to-moderate right and mild left ICA stenoses. 3. Mild right vertebral artery origin stenosis. 4. Mild cervical carotid artery atherosclerosis without significant stenosis. Electronically Signed   By: Logan Bores M.D.   On: 03/09/2021 10:47   MR BRAIN WO CONTRAST  Result Date: 03/09/2021 CLINICAL DATA:  Stroke, follow-up. Right arm pain, numbness, and weakness. EXAM: MRI HEAD WITHOUT CONTRAST TECHNIQUE: Multiplanar, multiecho pulse sequences of the brain and surrounding structures were obtained without intravenous contrast. COMPARISON:  Head CT 03/09/2021 and MRI 04/27/2068 FINDINGS: Brain: There is a small acute infarct involving the posterior aspect of the left lentiform nucleus and corona radiata. No intracranial hemorrhage, mass, midline shift, or extra-axial fluid collection is identified. The ventricles and sulci are normal. No significant chronic white matter disease is evident. Vascular: More fully evaluated on the immediately preceding CTA. Skull and upper cervical spine: No suspicious marrow lesion. Sinuses/Orbits: Unremarkable orbits. Paranasal sinuses and mastoid air cells are clear. Other: None. IMPRESSION: Small acute infarct in the posterior left basal ganglia. Electronically Signed   By: Logan Bores M.D.   On: 03/09/2021 10:53   ECHOCARDIOGRAM COMPLETE  Result Date: 03/09/2021    ECHOCARDIOGRAM REPORT   Patient Name:   Emma Glenn Date of Exam: 03/09/2021 Medical Rec #:  [id number]     Height:       61.0 in Accession #:    [id number]    Weight:       146.0 lb Date of Birth:  08/31/1965    BSA:           1.652 m Patient Age:    14 years      BP:           139/85 mmHg Patient Gender: F             HR:           72 bpm. Exam Location:  [facility] Procedure: 2D Echo, 3D Echo, Cardiac Doppler and Color Doppler Indications:    Stroke  History:        Patient has no prior history of Echocardiogram examinations.                 Abnormal ECG, Stroke; Risk Factors:Hypertension and Diabetes.  Sonographer:    Roseanna Rainbow RDCS Referring Phys: [id number] [facility]  Sonographer Comments: Suboptimal parasternal window. IMPRESSIONS  1. Left ventricular ejection fraction, by estimation, is 65 to 70%. The left ventricle has normal function. The left ventricle has no regional wall motion abnormalities. Left ventricular diastolic parameters were normal.  2. Right ventricular systolic function is normal. The right ventricular size is normal. Tricuspid regurgitation signal is inadequate for assessing PA pressure.  3. The aortic valve is  tricuspid and mildly calcified. There is nonmobile nodular calcification at the base of the left coronary cusp. Aortic valve regurgitation is trivial. No aortic stenosis.  4. The inferior vena cava is normal in size with greater than 50% respiratory variability, suggesting right atrial pressure of 3 mmHg.  5. The mitral valve is grossly normal. Trivial mitral valve regurgitation. Comparison(s): No prior Echocardiogram. FINDINGS  Left Ventricle: Left ventricular ejection fraction, by estimation, is 65 to 70%. The left ventricle has normal function. The left ventricle has no regional wall motion abnormalities. The left ventricular internal cavity size was normal in size. There is  borderline left ventricular hypertrophy. Left ventricular diastolic parameters were normal. Right Ventricle: The right ventricular size is normal. No increase in right ventricular wall thickness. Right ventricular systolic function is normal. Tricuspid regurgitation signal is inadequate for assessing PA pressure. Left Atrium: Left  atrial size was normal in size. Right Atrium: Right atrial size was normal in size. Pericardium: There is no evidence of pericardial effusion. Mitral Valve: The mitral valve is grossly normal. Trivial mitral valve regurgitation. Tricuspid Valve: The tricuspid valve is grossly normal. Tricuspid valve regurgitation is trivial. Aortic Valve: The aortic valve is tricuspid. There is mild calcification of the aortic valve. Aortic valve regurgitation is trivial. No aortic stenosis is present. Pulmonic Valve: The pulmonic valve was grossly normal. Pulmonic valve regurgitation is trivial. Aorta: The aortic root is normal in size and structure. Venous: The inferior vena cava is normal in size with greater than 50% respiratory variability, suggesting right atrial pressure of 3 mmHg. IAS/Shunts: No atrial level shunt detected by color flow Doppler.  LEFT VENTRICLE PLAX 2D LVIDd:         3.50 cm     Diastology LVIDs:         2.20 cm     LV e' medial:    10.60 cm/s LV PW:         1.00 cm     LV E/e' medial:  8.7 LV IVS:        1.00 cm     LV e' lateral:   9.36 cm/s LVOT diam:     1.75 cm     LV E/e' lateral: 9.8 LV SV:         54 LV SV Index:   33 LVOT Area:     2.41 cm  LV Volumes (MOD) LV vol d, MOD A2C: 63.3 ml LV vol d, MOD A4C: 45.1 ml LV vol s, MOD A2C: 25.0 ml LV vol s, MOD A4C: 10.3 ml LV SV MOD A2C:     38.4 ml LV SV MOD A4C:     45.1 ml LV SV MOD BP:      37.7 ml RIGHT VENTRICLE             IVC RV S prime:     12.60 cm/s  IVC diam: 1.50 cm TAPSE (M-mode): 2.2 cm LEFT ATRIUM             Index        RIGHT ATRIUM           Index LA diam:        2.70 cm 1.63 cm/m   RA Area:     11.00 cm LA Vol (A2C):   24.0 ml 14.53 ml/m  RA Volume:   24.90 ml  15.07 ml/m LA Vol (A4C):   14.1 ml 8.53 ml/m LA Biplane Vol: 20.4 ml 12.35 ml/m  AORTIC VALVE LVOT Vmax:  118.00 cm/s LVOT Vmean:  75.300 cm/s LVOT VTI:    0.224 m  AORTA Ao Root diam: 3.10 cm MITRAL VALVE MV Area (PHT): 3.56 cm    SHUNTS MV Decel Time: 213 msec    Systemic  VTI:  0.22 m MV E velocity: 92.15 cm/s  Systemic Diam: 1.75 cm MV A velocity: 95.05 cm/s MV E/A ratio:  0.97 Rozann Lesches MD Electronically signed by Rozann Lesches MD Signature Date/Time: 03/09/2021/4:22:17 PM    Final    CT HEAD CODE STROKE WO CONTRAST  Result Date: 03/09/2021 CLINICAL DATA:  Code stroke. Neuro deficit, acute, stroke suspected. Right arm pain, numbness, and weakness. EXAM: CT HEAD WITHOUT CONTRAST TECHNIQUE: Contiguous axial images were obtained from the base of the skull through the vertex without intravenous contrast. RADIATION DOSE REDUCTION: This exam was performed according to the departmental dose-optimization program which includes automated exposure control, adjustment of the mA and/or kV according to patient size and/or use of iterative reconstruction technique. COMPARISON:  Head CT and MRI 04/28/2015 FINDINGS: Brain: There is no evidence of an acute infarct, intracranial hemorrhage, mass, midline shift, or extra-axial fluid collection. The ventricles and sulci are normal. Vascular: Calcified atherosclerosis at the skull base. No hyperdense vessel. Skull: No fracture or suspicious osseous lesion. Sinuses/Orbits: Visualized paranasal sinuses and mastoid air cells are clear. Unremarkable orbits. Other: None. ASPECTS [facility] Stroke Program Early CT Score) - Ganglionic level infarction (caudate, lentiform nuclei, internal capsule, insula, M1-M3 cortex): 7 - Supraganglionic infarction (M4-M6 cortex): 3 Total score (0-10 with 10 being normal): 10 IMPRESSION: Unremarkable CT appearance of the brain.  ASPECTS of 10. These results were called by telephone at the time of interpretation on 03/09/2021 at 9:15 am to Dr. Milton Ferguson, who verbally acknowledged these results. Electronically Signed   By: Logan Bores M.D.   On: 03/09/2021 09:15    Radiological Exams on Admission: CT ANGIO HEAD NECK W WO CM  Result Date: 03/09/2021 CLINICAL DATA:  Stroke/TIA, determine embolic source. Right  arm pain, numbness, and weakness. EXAM: CT ANGIOGRAPHY HEAD AND NECK TECHNIQUE: Multidetector CT imaging of the head and neck was performed using the standard protocol during bolus administration of intravenous contrast. Multiplanar CT image reconstructions and MIPs were obtained to evaluate the vascular anatomy. Carotid stenosis measurements (when applicable) are obtained utilizing NASCET criteria, using the distal internal carotid diameter as the denominator. RADIATION DOSE REDUCTION: This exam was performed according to the departmental dose-optimization program which includes automated exposure control, adjustment of the mA and/or kV according to patient size and/or use of iterative reconstruction technique. CONTRAST:  36m OMNIPAQUE IOHEXOL 350 MG/ML SOLN COMPARISON:  None. FINDINGS: CTA NECK FINDINGS Aortic arch: Standard 3 vessel aortic arch. No evidence of significant arch vessel origin stenosis. Right carotid system: Patent with a small amount of calcified and soft plaque in the carotid bulb. No evidence of a significant stenosis or dissection. Left carotid system: Patent with a small amount of calcified plaque in the carotid bulb. No evidence of a significant stenosis or dissection. Vertebral arteries: Patent with calcified plaque at the right vertebral artery origin resulting in mild stenosis. Minimal nonstenotic plaque in the left V3 segment. Mildly dominant left vertebral artery. Skeleton: Moderate cervical disc and facet degeneration. Other neck: No evidence of cervical lymphadenopathy or mass. Upper chest: Clear lung apices. Review of the MIP images confirms the above findings CTA HEAD FINDINGS Anterior circulation: The internal carotid arteries are patent from skull base to carotid termini with atherosclerotic plaque resulting in  bilateral cavernous and paraclinoid stenoses, mild-to-moderate on the right and mild on the left. ACAs and MCAs are patent with mild branch vessel irregularity but no  evidence of a proximal branch occlusion or significant proximal stenosis. No aneurysm is identified. Posterior circulation: The intracranial vertebral arteries are widely patent to the basilar. The right PICA and left AICA appear dominant. Patent SCAs are seen bilaterally. The basilar artery is widely patent. Both PCAs are patent without evidence of a significant proximal stenosis. No aneurysm is identified. Venous sinuses: Patent. Hypoplastic right transverse and sigmoid sinuses with an arachnoid granulation near the transverse-sigmoid junction. Anatomic variants: None. Review of the MIP images confirms the above findings IMPRESSION: 1. No large vessel occlusion. 2. Intracranial atherosclerosis with mild-to-moderate right and mild left ICA stenoses. 3. Mild right vertebral artery origin stenosis. 4. Mild cervical carotid artery atherosclerosis without significant stenosis. Electronically Signed   By: Logan Bores M.D.   On: 03/09/2021 10:47   MR BRAIN WO CONTRAST  Result Date: 03/09/2021 CLINICAL DATA:  Stroke, follow-up. Right arm pain, numbness, and weakness. EXAM: MRI HEAD WITHOUT CONTRAST TECHNIQUE: Multiplanar, multiecho pulse sequences of the brain and surrounding structures were obtained without intravenous contrast. COMPARISON:  Head CT 03/09/2021 and MRI 04/27/2068 FINDINGS: Brain: There is a small acute infarct involving the posterior aspect of the left lentiform nucleus and corona radiata. No intracranial hemorrhage, mass, midline shift, or extra-axial fluid collection is identified. The ventricles and sulci are normal. No significant chronic white matter disease is evident. Vascular: More fully evaluated on the immediately preceding CTA. Skull and upper cervical spine: No suspicious marrow lesion. Sinuses/Orbits: Unremarkable orbits. Paranasal sinuses and mastoid air cells are clear. Other: None. IMPRESSION: Small acute infarct in the posterior left basal ganglia. Electronically Signed   By: Logan Bores M.D.   On: 03/09/2021 10:53   ECHOCARDIOGRAM COMPLETE  Result Date: 03/09/2021    ECHOCARDIOGRAM REPORT   Patient Name:   Emma Glenn Date of Exam: 03/09/2021 Medical Rec #:  [id number]     Height:       61.0 in Accession #:    [id number]    Weight:       146.0 lb Date of Birth:  [birth year]    BSA:          1.652 m Patient Age:    26 years      BP:           139/85 mmHg Patient Gender: F             HR:           72 bpm. Exam Location:  [facility] Procedure: 2D Echo, 3D Echo, Cardiac Doppler and Color Doppler Indications:    Stroke  History:        Patient has no prior history of Echocardiogram examinations.                 Abnormal ECG, Stroke; Risk Factors:Hypertension and Diabetes.  Sonographer:    Roseanna Rainbow RDCS Referring Phys: [id number] [facility]  Sonographer Comments: Suboptimal parasternal window. IMPRESSIONS  1. Left ventricular ejection fraction, by estimation, is 65 to 70%. The left ventricle has normal function. The left ventricle has no regional wall motion abnormalities. Left ventricular diastolic parameters were normal.  2. Right ventricular systolic function is normal. The right ventricular size is normal. Tricuspid regurgitation signal is inadequate for assessing PA pressure.  3. The aortic valve is tricuspid and mildly calcified. There is nonmobile nodular  calcification at the base of the left coronary cusp. Aortic valve regurgitation is trivial. No aortic stenosis.  4. The inferior vena cava is normal in size with greater than 50% respiratory variability, suggesting right atrial pressure of 3 mmHg.  5. The mitral valve is grossly normal. Trivial mitral valve regurgitation. Comparison(s): No prior Echocardiogram. FINDINGS  Left Ventricle: Left ventricular ejection fraction, by estimation, is 65 to 70%. The left ventricle has normal function. The left ventricle has no regional wall motion abnormalities. The left ventricular internal cavity size was normal in size. There is  borderline left  ventricular hypertrophy. Left ventricular diastolic parameters were normal. Right Ventricle: The right ventricular size is normal. No increase in right ventricular wall thickness. Right ventricular systolic function is normal. Tricuspid regurgitation signal is inadequate for assessing PA pressure. Left Atrium: Left atrial size was normal in size. Right Atrium: Right atrial size was normal in size. Pericardium: There is no evidence of pericardial effusion. Mitral Valve: The mitral valve is grossly normal. Trivial mitral valve regurgitation. Tricuspid Valve: The tricuspid valve is grossly normal. Tricuspid valve regurgitation is trivial. Aortic Valve: The aortic valve is tricuspid. There is mild calcification of the aortic valve. Aortic valve regurgitation is trivial. No aortic stenosis is present. Pulmonic Valve: The pulmonic valve was grossly normal. Pulmonic valve regurgitation is trivial. Aorta: The aortic root is normal in size and structure. Venous: The inferior vena cava is normal in size with greater than 50% respiratory variability, suggesting right atrial pressure of 3 mmHg. IAS/Shunts: No atrial level shunt detected by color flow Doppler.  LEFT VENTRICLE PLAX 2D LVIDd:         3.50 cm     Diastology LVIDs:         2.20 cm     LV e' medial:    10.60 cm/s LV PW:         1.00 cm     LV E/e' medial:  8.7 LV IVS:        1.00 cm     LV e' lateral:   9.36 cm/s LVOT diam:     1.75 cm     LV E/e' lateral: 9.8 LV SV:         54 LV SV Index:   33 LVOT Area:     2.41 cm  LV Volumes (MOD) LV vol d, MOD A2C: 63.3 ml LV vol d, MOD A4C: 45.1 ml LV vol s, MOD A2C: 25.0 ml LV vol s, MOD A4C: 10.3 ml LV SV MOD A2C:     38.4 ml LV SV MOD A4C:     45.1 ml LV SV MOD BP:      37.7 ml RIGHT VENTRICLE             IVC RV S prime:     12.60 cm/s  IVC diam: 1.50 cm TAPSE (M-mode): 2.2 cm LEFT ATRIUM             Index        RIGHT ATRIUM           Index LA diam:        2.70 cm 1.63 cm/m   RA Area:     11.00 cm LA Vol (A2C):   24.0  ml 14.53 ml/m  RA Volume:   24.90 ml  15.07 ml/m LA Vol (A4C):   14.1 ml 8.53 ml/m LA Biplane Vol: 20.4 ml 12.35 ml/m  AORTIC VALVE LVOT Vmax:   118.00 cm/s LVOT Vmean:  75.300 cm/s LVOT VTI:    0.224 m  AORTA Ao Root diam: 3.10 cm MITRAL VALVE MV Area (PHT): 3.56 cm    SHUNTS MV Decel Time: 213 msec    Systemic VTI:  0.22 m MV E velocity: 92.15 cm/s  Systemic Diam: 1.75 cm MV A velocity: 95.05 cm/s MV E/A ratio:  0.97 Rozann Lesches MD Electronically signed by Rozann Lesches MD Signature Date/Time: 03/09/2021/4:22:17 PM    Final    CT HEAD CODE STROKE WO CONTRAST  Result Date: 03/09/2021 CLINICAL DATA:  Code stroke. Neuro deficit, acute, stroke suspected. Right arm pain, numbness, and weakness. EXAM: CT HEAD WITHOUT CONTRAST TECHNIQUE: Contiguous axial images were obtained from the base of the skull through the vertex without intravenous contrast. RADIATION DOSE REDUCTION: This exam was performed according to the departmental dose-optimization program which includes automated exposure control, adjustment of the mA and/or kV according to patient size and/or use of iterative reconstruction technique. COMPARISON:  Head CT and MRI 04/28/2015 FINDINGS: Brain: There is no evidence of an acute infarct, intracranial hemorrhage, mass, midline shift, or extra-axial fluid collection. The ventricles and sulci are normal. Vascular: Calcified atherosclerosis at the skull base. No hyperdense vessel. Skull: No fracture or suspicious osseous lesion. Sinuses/Orbits: Visualized paranasal sinuses and mastoid air cells are clear. Unremarkable orbits. Other: None. ASPECTS [facility] Stroke Program Early CT Score) - Ganglionic level infarction (caudate, lentiform nuclei, internal capsule, insula, M1-M3 cortex): 7 - Supraganglionic infarction (M4-M6 cortex): 3 Total score (0-10 with 10 being normal): 10 IMPRESSION: Unremarkable CT appearance of the brain.  ASPECTS of 10. These results were called by telephone at the time of  interpretation on 03/09/2021 at 9:15 am to Dr. Milton Ferguson, who verbally acknowledged these results. Electronically Signed   By: Logan Bores M.D.   On: 03/09/2021 09:15     Family Communication: Admission, patients condition and plan of care including tests being ordered have been discussed with the patient and husband at bedside* who indicate understanding and agree with the plan   Code Status - Full Code  Likely DC to home with home health  Condition   stable  Roxan Hockey M.D on 03/09/2021 at 5:42 PM Go to [email or web address] -  for contact info  [facility] - Office  [phone]

## 2021-03-15 ENCOUNTER — Other Ambulatory Visit: Payer: Self-pay | Admitting: Internal Medicine

## 2021-03-15 DIAGNOSIS — E7800 Pure hypercholesterolemia, unspecified: Secondary | ICD-10-CM | POA: Diagnosis not present

## 2021-03-15 DIAGNOSIS — E039 Hypothyroidism, unspecified: Secondary | ICD-10-CM

## 2021-03-15 DIAGNOSIS — I679 Cerebrovascular disease, unspecified: Secondary | ICD-10-CM | POA: Diagnosis not present

## 2021-03-15 DIAGNOSIS — I10 Essential (primary) hypertension: Secondary | ICD-10-CM | POA: Diagnosis not present

## 2021-03-15 DIAGNOSIS — E1165 Type 2 diabetes mellitus with hyperglycemia: Secondary | ICD-10-CM | POA: Diagnosis not present

## 2021-03-27 ENCOUNTER — Other Ambulatory Visit: Payer: Self-pay

## 2021-03-27 ENCOUNTER — Encounter: Payer: Self-pay | Admitting: Internal Medicine

## 2021-03-27 ENCOUNTER — Ambulatory Visit (INDEPENDENT_AMBULATORY_CARE_PROVIDER_SITE_OTHER): Payer: Medicare HMO | Admitting: Neurology

## 2021-03-27 ENCOUNTER — Ambulatory Visit (INDEPENDENT_AMBULATORY_CARE_PROVIDER_SITE_OTHER): Payer: Medicare HMO | Admitting: Internal Medicine

## 2021-03-27 ENCOUNTER — Encounter: Payer: Self-pay | Admitting: Neurology

## 2021-03-27 VITALS — BP 118/73 | HR 76 | Ht 62.0 in | Wt 146.2 lb

## 2021-03-27 VITALS — BP 110/80 | HR 84 | Ht 62.0 in | Wt 145.6 lb

## 2021-03-27 DIAGNOSIS — E1165 Type 2 diabetes mellitus with hyperglycemia: Secondary | ICD-10-CM

## 2021-03-27 DIAGNOSIS — E039 Hypothyroidism, unspecified: Secondary | ICD-10-CM

## 2021-03-27 DIAGNOSIS — E785 Hyperlipidemia, unspecified: Secondary | ICD-10-CM

## 2021-03-27 DIAGNOSIS — I10 Essential (primary) hypertension: Secondary | ICD-10-CM | POA: Diagnosis not present

## 2021-03-27 DIAGNOSIS — I6381 Other cerebral infarction due to occlusion or stenosis of small artery: Secondary | ICD-10-CM

## 2021-03-27 NOTE — Patient Instructions (Addendum)
Continue Aspirin and Plavix for 3-4 weeks and then switch to only ASA for secondary stroke prevention and maintain strict control of hypertension with blood pressure goal below 130/90, diabetes with hemoglobin A1c goal below 6.0% and lipids with LDL cholesterol goal below 70 mg/dL. Quit smoking.   Lacunar Stroke A lacunar stroke (lacunar infarction) happens when an area of the brain does not get enough oxygen and blood flow. This can lead to permanent brain damage. A lacunar stroke is a medical emergency. It must be treated right away. What are the causes? This condition is caused by a blockage in a small artery deep in the brain. This may be due to: A buildup of fatty deposits that causes the arteries to narrow (atherosclerosis). This blocks blood flow in the brain. High blood pressure. What increases the risk? You are more likely to develop this condition if you: Have high blood pressure (hypertension). Have high cholesterol (hyperlipidemia). Smoke. Have diabetes. Have heart disease or artery disease, such as carotid artery disease or peripheral artery disease. Are age 58 or older. Have a personal or family history of stroke. What are the signs or symptoms? Symptoms of this condition usually develop suddenly. They may include: Weakness or numbness in your face, arm, or leg, especially on one side of your body. Trouble walking or moving your arms or legs. Loss of balance or coordination. Slurred speech. Vision problems. Dizziness. Nausea and vomiting. Severe headache. How is this diagnosed? This condition may be diagnosed based on: Your symptoms and medical history. A physical exam. Blood tests. A CT scan or MRI of the brain. How is this treated? This condition must be treated within 3-4 hours of the start of the stroke. The goal is to restore blood flow to the brain as soon as possible. Treatments may include: Medicine given through an IV to dissolve the blood clot. Blood  thinners (antiplatelets). Medicines to control blood pressure. Your health care provider may prescribe blood thinners to lower your risk of another stroke. Follow these instructions at home: Medicines Take over-the-counter and prescription medicines only as told by your health care provider. If you were told to take a medicine to thin your blood, such as aspirin, take it exactly as told by your health care provider. Taking too much blood-thinning medicine can cause bleeding. Taking too little may not protect you against a stroke and other problems. Eating and drinking Follow instructions from your health care provider about diet. Eat healthy foods. This includes plenty of fruits and vegetables, lean meats, whole grains, and low-fat dairy products. Avoid foods high in saturated fat, trans fat, or sodium. If you drink alcohol: Limit how much you have to: 0-1 drink a day for women. 0-2 drinks a day for men. Know how much alcohol is in your drink. In the [place] one drink equals 12 oz of beer, 5 oz of wine, or 1 oz of hard liquor. Safety If you need help walking, use a cane or walker as told by your health care provider. Take steps to lower the risk of falls in your home. This may include: Using raised toilets and a seat in the shower. Removing clutter and tripping hazards, such as cords or area rugs. Installing grab bars in the bedroom and bathroom. Activity Exercise regularly, as told by your health care provider. Take part in rehabilitation programs as told by your health care provider. This may include physical therapy, occupational therapy, or speech therapy. General instructions Do not use any products that contain  nicotine or tobacco. These products include cigarettes, chewing tobacco, and vaping devices, such as e-cigarettes. If you need help quitting, ask your health care provider. Keep all follow-up visits. This is important. Get help right away if:  You have any symptoms of  stroke. "BE FAST" is an easy way to remember the main warning signs of stroke: B - Balance. Signs are dizziness, sudden trouble walking, or loss of balance. E - Eyes. Signs are trouble seeing or a sudden change in vision. F - Face. Signs are sudden weakness or numbness of the face, or the face or eyelid drooping on one side. A - Arms. Signs are weakness or numbness in an arm. This happens suddenly and usually on one side of the body. S - Speech. Signs are sudden trouble speaking, slurred speech, or trouble understanding what people say. T - Time. Time to call emergency services. Write down what time symptoms started. You have other signs of stroke, such as: A sudden, severe headache. Nausea or vomiting. Seizure. You have a severe fall or injury. These symptoms may represent a serious problem that is an emergency. Do not wait to see if the symptoms will go away. Get medical help right away. Call your local emergency services (911 in the [place] Do not drive yourself to the hospital. Summary A lacunar stroke (lacunar infarction) happens when an area of the brain does not get enough oxygen. This can lead to permanent brain damage. This condition is a medical emergency that must be treated right away. Treatments must be done within 3-4 hours of the start of the stroke. Controlling your risk factors for stroke is the best way to avoid another lacunar stroke. Get help right away if you have any symptoms of stroke. "BE FAST" is an easy way to remember the main warning signs of stroke. This information is not intended to replace advice given to you by your health care provider. Make sure you discuss any questions you have with your health care provider. Document Revised: 08/19/2019 Document Reviewed: 08/19/2019 Elsevier Patient Education  [place].

## 2021-03-27 NOTE — Progress Notes (Signed)
[facility]    Provider:  Dr Jaynee Eagles Requesting Provider: Shirline Frees, MD Primary Care Provider:  Shirline Frees, MD  CC:  CVA  HPI:  Emma Glenn is a 56 y.o. female here as requested by Shirline Frees, MD for CVA.  She has a past medical history of morbid obesity, essential hypertension, obesity, remote seizure 2017 associated with Wellbutrin, tobacco dependence, stress reaction, anxiety, acquired hypothyroidism, hyperlipidemia, depression,hereditary hemochromatosis, autoimmune thyroiditis, other cirrhosis of liver (NASH in 2017 diagnosed), cerebrovascular disease, COPD, type 2 diabetes, migraine.  Per review of Dr. Doreene Adas notes, she is on Plavix, she is on a statin for her cholesterol, she continues to manage her diabetes with metformin and Jardiance, she continues to manage her hypertension with losartan and amlodipine, she is also on aspirin.  Per Dr. Doreene Adas note she was hospitalized at [facility] March 09, 2021, she presented to the ER with weakness in her right arm and right leg that morning when she woke up, her strength came back low in the emergency room after several hours, labs showed elevated white blood cells at 11, slightly elevated ALT at 46, glucose 137 otherwise labs were unremarkable, she was positive for THC, CT angiogram of the head and neck showed no large vessel occlusion, there was intracranial atherosclerosis involving the left ICA, mild right vertebral artery stenosis at the origin, the brain showed a small acute infarct in the posterior left basal ganglia.  She was told to take dual antiplatelet Plavix and aspirin 81 mg for 3 weeks then continue aspirin alone.  Patient is here for CVA small vessel lacunar infarct due to multiple risk factors including smoking, hypertension, hyperlipidemia, diabetes, possibly also THC use.  Patient was at her baseline on March 09, 2021, she woke up in the morning 5:30 AM found to have right finger weakness,  right arm and leg feeling weak, she also had intermittent right face arm and leg tingling, she did not notice any facial droop but had some difficulty speaking, she came to the emergency room for evaluation, blood pressure was 163/87.  Examination did show right-sided weakness, mild right nasolabial fold flattening, per Dr. Phoebe Sharps notes that I reviewed.  NIH was 5.  She was out of the window for tPA, CTA showed no large vessel occlusion, she had can pleat stroke work-up, placed on dual antiplatelet, she was not on aspirin prior so aspirin 81 and Plavix 75 for 3 weeks then aspirin alone.  Left lacunar infarct due to cerebrovascular risk factors including current smoking. She states she quit smoking. She has scheduled an appointment with her family doctor, she is following with pcp for all her risk factors, she is taking all her medications. She was not on aspirin before, so she is on plavix and aspirin and later this month will stop the plavix and continue the aspirin. She is atorvastatin. She still feels a little weak on her right side but she declined physical therapy, she feels she is improved.   Reviewed notes, labs and imaging from outside physicians, which showed:  Labs reviewed collected November 2022 include unremarkable CMP with BUN 9 and creatinine 0.7, unremarkable CBC  MRI 03/09/2021: Personally reviewed images and agree with the following: Small acute infarct in the posterior left basal ganglia.  CTA H&N: reviewed report: IMPRESSION: 1. No large vessel occlusion. 2. Intracranial atherosclerosis with mild-to-moderate right and mild left ICA stenoses. 3. Mild right vertebral artery origin stenosis. 4. Mild cervical carotid artery atherosclerosis without significant stenosis.  Ldl  109 Hgba1c 5.7  Review of Systems: Patient complains of symptoms per HPI as well as the following symptoms stroke, migraines. Pertinent negatives and positives per HPI. All others negative.   Social History    Socioeconomic History   Marital status: Married    Spouse name: Tommie Raymond   Number of children: 1   Years of education: 12   Highest education level: Not on file  Occupational History   Not on file  Tobacco Use   Smoking status: Former    Packs/day: 1.00    Types: Cigarettes    Quit date: 03/23/2021    Years since quitting: 0.0   Smokeless tobacco: Never  Vaping Use   Vaping Use: Never used  Substance and Sexual Activity   Alcohol use: Yes    Alcohol/week: 0.0 standard drinks    Comment: rare   Drug use: Yes    Types: Marijuana    Comment: Hx of marijuana use   Sexual activity: Yes    Birth control/protection: Surgical  Other Topics Concern   Not on file  Social History Narrative   Lives with husband and sister   Caffeine use: Tea/coffee daily   Social Determinants of Health   Financial Resource Strain: Not on file  Food Insecurity: Not on file  Transportation Needs: Not on file  Physical Activity: Not on file  Stress: Not on file  Social Connections: Not on file  Intimate Partner Violence: Not on file    Family History  Problem Relation Age of Onset   Hypertension Mother    Heart disease Mother    Diabetes Father    Heart disease Father    Heart disease Brother    Breast cancer Maternal Grandmother    Uterine cancer Maternal Aunt    Seizures Neg Hx    Stomach cancer Neg Hx    Colon cancer Neg Hx     Past Medical History:  Diagnosis Date   Acute stress reaction    Allergic rhinitis    Anxiety    Autoimmune thyroiditis    CIN I (cervical intraepithelial neoplasia I)    Cirrhosis of liver (HCC)    COPD (chronic obstructive pulmonary disease) ([place])    Depression    DM (diabetes mellitus) ([place])    GERD (gastroesophageal reflux disease)    Hemochromatosis    Hepatic steatosis    Hereditary hemochromatosis ([place])    Hyperlipidemia    Hypertension    Hypothyroidism    Labial cyst    Inclusion cyst   Liver cirrhosis ([place])    Morbid obesity (HCC)     NASH (nonalcoholic steatohepatitis)    Positive ANA (antinuclear antibody)    Pure hypercholesterolemia    Seizures ([place])    last seizure 3/17? over dose wellbutrin   Thyroid disease    Graves dis-Radioactive Iodine   Tobacco dependence     Patient Active Problem List   Diagnosis Date Noted   Acute CVA (cerebrovascular accident) ([place]) 03/09/2021   Elevated coronary artery calcium score 03/08/2021   Hyperlipidemia 08/27/2017   Elevated urinary free cortisol level 07/01/2016   Type 2 diabetes mellitus with hyperglycemia, without long-term current use of insulin ([place]) 07/01/2016   Abnormal EEG 07/15/2015   Rotator cuff tear 06/13/2015   Tonic-clonic generalized seizure ([place]) 05/11/2015   Seizure ([place]) 04/28/2015   Anterior shoulder dislocation 04/28/2015   Hypertension, essential    CIN I (cervical intraepithelial neoplasia I)    Hypothyroidism    Labial cyst  Past Surgical History:  Procedure Laterality Date   CERVICAL BIOPSY  W/ LOOP ELECTRODE EXCISION     Excision labial inclusion cyst   CESAREAN SECTION     COLPOSCOPY     HAND SURGERY     SHOULDER ARTHROSCOPY WITH OPEN ROTATOR CUFF REPAIR AND DISTAL CLAVICLE ACROMINECTOMY Left 06/13/2015   Procedure: LEFT SHOULDER ARTHROSCOPY, DEBRIDEMENT, OPEN ROTATOR CUFF REPAIR, CORACOID FRACTURE FIXATION, BICEPS TENODESIS;  Surgeon: Meredith Pel, MD;  Location: [place];  Service: Orthopedics;  Laterality: Left;   SHOULDER CLOSED REDUCTION Left 09/26/2015   Procedure: CLOSED MANIPULATION SHOULDER UNDER ANESTHESIA;  Surgeon: Meredith Pel, MD;  Location: [facility];  Service: Orthopedics;  Laterality: Left;   TUBAL LIGATION      Current Outpatient Medications  Medication Sig Dispense Refill   amLODipine (NORVASC) 10 MG tablet Take 1 tablet (10 mg total) by mouth daily. 30 tablet 3   aspirin EC 81 MG EC tablet Take 1 tablet (81 mg total) by mouth daily with breakfast. take Aspirin 81 mg daily along with Plavix 75 mg daily for 30  days then after that STOP the Plavix  and continue ONLY Aspirin 81 mg daily indefinitely-- 30 tablet 11   atorvastatin (LIPITOR) 80 MG tablet Take 1 tablet (80 mg total) by mouth daily. Take in place of Pravastatin 30 tablet 11   BEVESPI AEROSPHERE 9-4.8 MCG/ACT AERO INHALE 2 PUFFS INTO THE LUNGS TWICE DAILY. REPLACES ANORO INHALER. (Patient taking differently: Inhale 2 puffs into the lungs in the morning and at bedtime.) 10.7 g 6   empagliflozin (JARDIANCE) 10 MG TABS tablet Take 1 tablet (10 mg total) by mouth daily. 90 tablet 3   glucose blood (BAYER CONTOUR TEST) test strip Use 2x a day 200 each 3   levothyroxine (SYNTHROID) 125 MCG tablet TAKE 1 TABLET (125 MCG TOTAL) BY MOUTH DAILY. 90 tablet 1   losartan (COZAAR) 50 MG tablet Take 1 tablet (50 mg total) by mouth daily. 90 tablet 11   metFORMIN (GLUCOPHAGE-XR) 500 MG 24 hr tablet Take 2 tablets (1,000 mg total) by mouth daily. Restart on Sunday 03/11/21 180 tablet 3   nicotine (NICODERM CQ - DOSED IN MG/24 HOURS) 21 mg/24hr patch Place 1 patch (21 mg total) onto the skin daily for 28 days. 28 patch 0   omeprazole (PRILOSEC) 40 MG capsule Take 1 capsule (40 mg total) by mouth daily. 30 capsule 6   SURE COMFORT PEN NEEDLES 32G X 4 MM MISC USE TO INJECT LANTUS ONCE DAILY 100 each 3   No current facility-administered medications for this visit.    Allergies as of 03/27/2021 - Review Complete 03/27/2021  Allergen Reaction Noted   Doxycycline Other (See Comments) 06/06/2015   Nitrofurantoin monohyd macro Other (See Comments) 02/19/2011   Septra [sulfamethoxazole-trimethoprim] Other (See Comments) 06/06/2015   Lamictal [lamotrigine] Rash 09/15/2015    Vitals: BP 118/73    Pulse 76    Ht 5' 2"  (1.575 m)    Wt 146 lb 3.2 oz (66.3 kg)    LMP 06/23/2016    BMI 26.74 kg/m  Last Weight:  Wt Readings from Last 1 Encounters:  03/27/21 146 lb 3.2 oz (66.3 kg)   Last Height:   Ht Readings from Last 1 Encounters:  03/27/21 5' 2"  (1.575 m)      Physical exam: Exam: Gen: NAD, conversant, well nourised, well groomed                     CV:  RRR, no MRG. No Carotid Bruits. No peripheral edema, warm, nontender Eyes: Conjunctivae clear without exudates or hemorrhage  Neuro: Detailed Neurologic Exam  Speech:    Speech is normal; fluent and spontaneous with normal comprehension.  Cognition:    The patient is oriented to person, place, and time;     recent and remote memory intact;     language fluent;     normal attention, concentration,     fund of knowledge Cranial Nerves:    The pupils are equal, round, and reactive to light. Pupils too small to visualize fundi.  Visual fields are full to finger confrontation. Extraocular movements are intact. Trigeminal sensation is intact and the muscles of mastication are normal. The face is symmetric. The palate elevates in the midline. Hearing intact. Voice is normal. Shoulder shrug is normal. The tongue has normal motion without fasciculations.   Coordination:    Normal    Gait:    normal.   Motor Observation:    No asymmetry, no atrophy, and no involuntary movements noted. Tone:    Normal muscle tone.    Posture:    Posture is normal. normal erect    Strength:    Strength is V/V in the upper and lower limbs.      Sensation: intact to LT     Reflex Exam:  DTR's:    Deep tendon reflexes in the upper and lower extremities are normal bilaterally.   Toes:    The toes are downgoing bilaterally.   Clonus:    Clonus is absent.    Assessment/Plan:  56 y.o. female here as requested by Shirline Frees, MD for CVA.  She has a past medical history of morbid obesity, essential hypertension, obesity, remote seizure 2017 associated with Wellbutrin, tobacco dependence, stress reaction, anxiety, acquired hypothyroidism, hyperlipidemia, depression,hereditary hemochromatosis, autoimmune thyroiditis, other cirrhosis of liver (NASH in 2017 diagnosed), cerebrovascular disease, COPD, type  2 diabetes, migraine.   -Lacunar stroke last month posterior left basal ganglia due to cerebrovascular risk factors including current smoking, diabetes, hypertension, high cholesterol, diabetes.  - Left lacunar infarct due to cerebrovascular risk factors including current smoking. She states she quit smoking. She has scheduled an appointment with her family doctor, she is following with pcp for all her risk factors, she is taking all her medications. She was not on aspirin before, so she is on plavix and aspirin and later this month will stop the plavix and continue the aspirin. She is atorvastatin. She still feels a little weak on her right side but she declined physical therapy, she feels she is improved.   - I had a long d/w patient about her recent stroke, risk for recurrent stroke/TIAs, personally independently reviewed imaging studies and stroke evaluation results and answered questions.Continue DUAP for 3 weeks and then switch to only ASA for secondary stroke prevention and maintain strict control of hypertension with blood pressure goal below 130/90, diabetes with hemoglobin A1c goal below 6.0% and lipids with LDL cholesterol goal below 70 mg/dL. I also advised the patient to eat a healthy diet with plenty of whole grains, lean meats, fruits and vegetables, exercise regularly and maintain ideal body weight  - can return to primary care    Cc: Shirline Frees, MD,  Shirline Frees, MD  Sarina Ill, MD  [facility] [address] [place], [place] [address]  Phone [phone] Fax [phone]

## 2021-03-27 NOTE — Progress Notes (Signed)
Patient ID: Emma Glenn, female   DOB: May 31, 1965, 56 y.o.   MRN: [id number]   This visit occurred during the SARS-CoV-2 public health emergency.  Safety protocols were in place, including screening questions prior to the visit, additional usage of staff PPE, and extensive cleaning of exam room while observing appropriate contact time as indicated for disinfecting solutions.   HPI: Emma Glenn is a 56 y.o.-year-old female, initially referred by her GI Dr, Dr. Hilarie Fredrickson, returning for follow-up for uncontrolled DM2, currently insulin independent, with complications (lacunar CVA 02/2021) and hypothyroidism. Last visit 4 months ago.   Interim history: No increased urination, blurry vision, nausea, chest pain.  Since last visit, she had an acute CVA on 03/09/2021:  small acute infarct in the posterior left basal ganglia >>  R arm and leg paresis >> resolved.  Started on ASA and  Plavix 75 x 30 days, then stay only on ASA. She stopped smoking after her stroke.  DM2: Reviewed HbA1c levels: Lab Results  Component Value Date   HGBA1C 5.7 (H) 03/09/2021   HGBA1C 6.3 (A) 10/24/2020   HGBA1C 6.0 (A) 04/19/2020  Labs from [place], drawn on 07/29/2016: HbA1c still very high, at 12.6%  Pt is on a regimen of: - Metformin ER 1000 mg with breakfast - Jardiance 10 mg before breakfast - through patient assistance program Previously also on: -  - through patient assistance program >> ran out 11/2019 -  >> stopped 06/2019  She checks her sugars 0-1 times a day: - am: 82-122 >> 115-128 >> 94-127 >> 110-129, 139 >> 127, 149 - 2h after b'fast:  89-188 >> 100 >> n/c >> 125, 136 >> n/c >> 114 - before lunch:  80-91 >> 114-120 >> 115 >> 91 >> 77-112 >> n/c - 2h after lunch:  n/c >> <141 >> n/c >> 166, 169 >> 228 >> 89 - before dinner:  109-116 >> 96-120 >> n/c >> 71, 87 >> 86, 104 - 2h after dinner: 94-106 >> <153 >> n/c >> 162 >> 131 >> 89 - bedtime: 90, 103  >> <150 >> 123 >> n/c >> 98 >> n/c - nighttime:  n/c Lowest sugar was 80 >> 82 >> 115 >> 71 >> 77 >> 83; it is unclear at which level she has hypoglycemia awareness: Highest sugar was 136 >> 153 >> 128 >> 169 >> 228 >> 149 x1.  [name] meals are: - Breakfast: Cereals or 2 eggs and toast - Lunch: Skips - Dinner: Meat and veggies - Snacks: Fruit  Stopped milkshakes. Occasional sodas.  Meter: Bayer Contour  -No CKD, last BUN/creatinine:  Lab Results  Component Value Date   BUN 9 03/09/2021   BUN 9 04/19/2020   CREATININE 0.70 03/09/2021   CREATININE 0.78 04/19/2020    -+ HL; last set of lipids: Lab Results  Component Value Date   CHOL 170 03/09/2021   HDL 33 (L) 03/09/2021   LDLCALC 109 (H) 03/09/2021   TRIG 142 03/09/2021   CHOLHDL 5.2 03/09/2021   We restarted pravastatin 12/2016, after we consulted with her gastroenterologist, Dr. Hilarie Fredrickson.  LFTs remained stable after starting the statin but started to increase afterwards, being higher at last check in 08/2017.  Dr. Hilarie Fredrickson is aware of the above and considers these to be related to her fatty liver disease.  We continued her pravastatin >> now 80 mg Lipitor - started 02/2021. Of note, she is also seen Dr. Coralyn Pear (hepatologist) and she has liver ultrasound every 6 months.  Latest LFTs were normal at last check: Lab Results  Component Value Date   ALT 46 (H) 03/09/2021   AST 35 03/09/2021   ALKPHOS 59 03/09/2021   BILITOT 0.5 03/09/2021   ACR is fluctuating: Lab Results  Component Value Date   MICRALBCREAT 44.9 (H) 04/19/2020   MICRALBCREAT 20.2 07/17/2018   MICRALBCREAT 237.7 (H) 04/02/2018   MICRALBCREAT 16.1 02/18/2017  07/29/2016: UACR was high, 148.9 Off Cozaar after she ran out.  - last eye exam: 03/2019: No DR  -No numbness and tingling in her feet.  Hypothyroidism:  Pt is on levothyroxine 125 mcg daily, taken: - in am - fasting - at least 30 min from b'fast - no calcium - no iron - no multivitamins - + PPIs at bedtime - occas. - not on  Biotin  Reviewed her TFTs: Lab Results  Component Value Date   TSH 2.552 03/09/2021   TSH 1.78 04/19/2020   TSH 0.33 (L) 03/10/2019   TSH 0.10 (L) 11/04/2018   TSH 0.12 (L) 07/17/2018   TSH 4.77 (H) 04/02/2018   TSH 2.65 05/21/2017   TSH 17.64 (H) 02/18/2017   TSH 4.45 06/04/2016   TSH 2.338 04/28/2015  07/29/2016: TSH 6.18.  Lab Results  Component Value Date   FREET4 1.41 04/19/2020   FREET4 1.21 03/10/2019   FREET4 1.68 (H) 11/04/2018   FREET4 1.60 07/17/2018   FREET4 1.36 05/21/2017   FREET4 0.96 02/18/2017   Reviewed previous history: She started to have severe generalized mm cramps in 03/2016.  Around that time >> RUQ pain >> HIDA scan >> GB w/o clear stones, but with sludge. She had an abd. CT scan: increased size of her liver + liver steatosis.  She was also found to have a 24-hour urine free cortisol, which returned slightly elevated: Component     Latest Ref Rng & Units 06/04/2016 06/14/2016  Cortisol (Ur), Free     4.0 - 50.0 mcg/24 h  57.0 (H)  Results received     0.63 - 2.50 g/24 h  1.27  Cortisol, Plasma     Ug/dL (collected at 5 pm) 6.8    A CT abdomen (05/2016) showed normal adrenals.  A brain MRI (04/2015) showed normal pituitary gland.  A dexamethasone suppression test was normal >> no signs of Cushing's syndrome:  Component     Latest Ref Rng & Units 07/02/2016  Cortisol - AM     mcg/dL 1.8 (L)  Dexamethasone, Serum     ng/dL 385   She had a liver Bx  >> has level 3 scarring. 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  Mother has Alzheimer's disease - lives with pt >> increased stress.  ROS: + See HPI  I reviewed pt's medications, allergies, PMH, social hx, family hx, and changes were documented in the history of present illness. Otherwise, unchanged from my initial visit note.  Past Medical History:  Diagnosis Date   Acute stress reaction    Allergic rhinitis    Anxiety    Autoimmune thyroiditis    CIN I  (cervical intraepithelial neoplasia I)    Cirrhosis of liver (HCC)    COPD (chronic obstructive pulmonary disease) (HCC)    Depression    DM (diabetes mellitus) (HCC)    GERD (gastroesophageal reflux disease)    Hemochromatosis    Hepatic steatosis    Hereditary hemochromatosis ([place])    Hyperlipidemia    Hypertension    Hypothyroidism    Labial  cyst    Inclusion cyst   Liver cirrhosis (HCC)    Morbid obesity (HCC)    NASH (nonalcoholic steatohepatitis)    Positive ANA (antinuclear antibody)    Pure hypercholesterolemia    Seizures ([place])    last seizure 3/17? over dose wellbutrin   Thyroid disease    Graves dis-Radioactive Iodine   Tobacco dependence    Past Surgical History:  Procedure Laterality Date   CERVICAL BIOPSY  W/ LOOP ELECTRODE EXCISION     Excision labial inclusion cyst   CESAREAN SECTION     COLPOSCOPY     HAND SURGERY     SHOULDER ARTHROSCOPY WITH OPEN ROTATOR CUFF REPAIR AND DISTAL CLAVICLE ACROMINECTOMY Left 06/13/2015   Procedure: LEFT SHOULDER ARTHROSCOPY, DEBRIDEMENT, OPEN ROTATOR CUFF REPAIR, CORACOID FRACTURE FIXATION, BICEPS TENODESIS;  Surgeon: Meredith Pel, MD;  Location: [facility];  Service: Orthopedics;  Laterality: Left;   SHOULDER CLOSED REDUCTION Left 09/26/2015   Procedure: CLOSED MANIPULATION SHOULDER UNDER ANESTHESIA;  Surgeon: Meredith Pel, MD;  Location: [facility];  Service: Orthopedics;  Laterality: Left;   TUBAL LIGATION     Social History   Social History   Marital status: Married    Spouse name: Emma Glenn   Number of children: 1   Years of education: 34   Occupational History   Associate Professor    Social History Main Topics   Smoking status: Former Smoker    Packs/day: 1.00    Types: Cigarettes    Quit date: 02/14/2015   Smokeless tobacco: Never Used   Alcohol use 0.0 oz/week     Comment: rare   Drug use: No     Comment: Hx of marijuana use   Sexual activity: Yes    Birth control/ protection: Surgical   Social History  Narrative   Lives with husband and sister   Caffeine use: Tea/coffee daily      Current Outpatient Medications on File Prior to Visit  Medication Sig Dispense Refill   amLODipine (NORVASC) 10 MG tablet Take 1 tablet (10 mg total) by mouth daily. 30 tablet 3   aspirin EC 81 MG EC tablet Take 1 tablet (81 mg total) by mouth daily with breakfast. take Aspirin 81 mg daily along with Plavix 75 mg daily for 30 days then after that STOP the Plavix  and continue ONLY Aspirin 81 mg daily indefinitely-- 30 tablet 11   atorvastatin (LIPITOR) 80 MG tablet Take 1 tablet (80 mg total) by mouth daily. Take in place of Pravastatin 30 tablet 11   BEVESPI AEROSPHERE 9-4.8 MCG/ACT AERO INHALE 2 PUFFS INTO THE LUNGS TWICE DAILY. REPLACES ANORO INHALER. (Patient taking differently: Inhale 2 puffs into the lungs in the morning and at bedtime.) 10.7 g 6   empagliflozin (JARDIANCE) 10 MG TABS tablet Take 1 tablet (10 mg total) by mouth daily. 90 tablet 3   glucose blood (BAYER CONTOUR TEST) test strip Use 2x a day 200 each 3   levothyroxine (SYNTHROID) 125 MCG tablet TAKE 1 TABLET (125 MCG TOTAL) BY MOUTH DAILY. 90 tablet 1   losartan (COZAAR) 50 MG tablet Take 1 tablet (50 mg total) by mouth daily. 90 tablet 11   metFORMIN (GLUCOPHAGE-XR) 500 MG 24 hr tablet Take 2 tablets (1,000 mg total) by mouth daily. Restart on Sunday 03/11/21 180 tablet 3   nicotine (NICODERM CQ - DOSED IN MG/24 HOURS) 21 mg/24hr patch Place 1 patch (21 mg total) onto the skin daily for 28 days. 28 patch 0  omeprazole (PRILOSEC) 40 MG capsule Take 1 capsule (40 mg total) by mouth daily. 30 capsule 6   SURE COMFORT PEN NEEDLES 32G X 4 MM MISC USE TO INJECT LANTUS ONCE DAILY 100 each 3   No current facility-administered medications on file prior to visit.   Allergies  Allergen Reactions   Doxycycline Other (See Comments)    Flu like symptoms (high fever, chills)   Nitrofurantoin Monohyd Macro Other (See Comments)    Flu like symptoms (high  fever, chills)   Septra [Sulfamethoxazole-Trimethoprim] Other (See Comments)    Flu like symptoms (high fever, chills)   Lamictal [Lamotrigine] Rash   Family History  Problem Relation Age of Onset   Hypertension Mother    Heart disease Mother    Diabetes Father    Heart disease Father    Heart disease Brother    Breast cancer Maternal Grandmother    Uterine cancer Maternal Aunt    Seizures Neg Hx    Stomach cancer Neg Hx    Colon cancer Neg Hx    PE: BP 110/80 (BP Location: Right Arm, Patient Position: Sitting, Cuff Size: Normal)    Pulse 84    Ht 5' 2"  (1.575 m)    Wt 145 lb 9.6 oz (66 kg)    LMP 06/23/2016    SpO2 97%    BMI 26.63 kg/m  Wt Readings from Last 3 Encounters:  03/27/21 145 lb 9.6 oz (66 kg)  03/27/21 146 lb 3.2 oz (66.3 kg)  03/09/21 146 lb (66.2 kg)   Constitutional: normal weight, in NAD Eyes: PERRLA, EOMI, no exophthalmos ENT: moist mucous membranes, no thyromegaly, no cervical lymphadenopathy Cardiovascular: RRR, No MRG Respiratory: CTA B Musculoskeletal: no deformities, strength intact in all 4 Skin: moist, warm, no rashes Neurological: no tremor with outstretched hands, DTR normal in all 4  ASSESSMENT: 1. DM2, non-insulin-dependent, with complications -acute CVA -03/09/2021  2. Hypothyroidism  -Post ablative, after RAI treatment for Graves' disease  3. HL  4. HTN  PLAN: 1. DM2 -Patient with longstanding, uncontrolled, type 2 diabetes, on oral antidiabetic regimen with metformin and SGLT2 inhibitor.  Previously on a GLP-1 receptor agonist and insulin, but we did not have to restart it after running out of Trulicity and we stopped Lantus due to good control.  Before last visit, HbA1c was excellent, at 6.3% (we did not change her regimen at that time), but since then, she had another HbA1c obtained last month and this was even lower, at 5.7%. At today's visit, she is not checking consistently, but whenever she checks, sugars are at goal later in the  day and she had 1 blood sugar higher than target in the morning.  For now, we will continue the current regimen. - I suggested to:  Patient Instructions  Please continue: - Metformin ER 1000 mg to am - Jardiance 10 mg before breakfast   Please continue levothyroxine 125 mcg daily  Take the thyroid hormone every day, with water, at least 30 minutes before breakfast, separated by at least 4 hours from: - acid reflux medications - calcium - iron - multivitamins  Please return in 6 months with your sugar log.   - advised to check sugars at different times of the day - 1x a day, rotating check times - advised for yearly eye exams >> she is UTD - return to clinic in 6 months      2. Hypothyroidism -Developed after RAI ablation - latest thyroid labs reviewed with pt. >> normal:  Lab Results  Component Value Date   TSH 2.552 03/09/2021  - she continues on LT4 125 mcg daily - pt feels good on this dose. - we discussed about taking the thyroid hormone every day, with water, >30 minutes before breakfast, separated by >4 hours from acid reflux medications, calcium, iron, multivitamins. Pt. is taking it correctly.  3. HL  -Reviewed latest lipid panel from 02/2021: LDL above target, HDL slightly low: Lab Results  Component Value Date   CHOL 170 03/09/2021   HDL 33 (L) 03/09/2021   LDLCALC 109 (H) 03/09/2021   TRIG 142 03/09/2021   CHOLHDL 5.2 03/09/2021  -Previously on pravastatin 20 mg daily  >> recently changed to atorvastatin 80 mg daily.  No side effects.  4. HTN -At last visit, blood pressure was high, 160/92 consistent with blood pressures at home -I advised her to start amlodipine 5 mg daily and continue Cozaar once a day - Blood pressure is at goal today - further management per PCP  Philemon Kingdom, MD PhD [facility] Endocrinology

## 2021-03-27 NOTE — Patient Instructions (Addendum)
Please continue: - Metformin ER 1000 mg am - Jardiance 10 mg before breakfast   Please continue levothyroxine 125 mcg daily  Take the thyroid hormone every day, with water, at least 30 minutes before breakfast, separated by at least 4 hours from: - acid reflux medications - calcium - iron - multivitamins  Please return in 6 months with your sugar log.

## 2021-04-16 ENCOUNTER — Other Ambulatory Visit: Payer: Medicare HMO | Admitting: *Deleted

## 2021-04-16 ENCOUNTER — Other Ambulatory Visit: Payer: Self-pay

## 2021-04-16 DIAGNOSIS — E785 Hyperlipidemia, unspecified: Secondary | ICD-10-CM

## 2021-04-16 LAB — LIPID PANEL
Chol/HDL Ratio: 4.1 ratio (ref 0.0–4.4)
Cholesterol, Total: 116 mg/dL (ref 100–199)
HDL: 28 mg/dL — ABNORMAL LOW (ref >39)
LDL Chol Calc (NIH): 59 mg/dL (ref 0–99)
Triglycerides: 169 mg/dL — ABNORMAL HIGH (ref 0–149)
VLDL Cholesterol Cal: 29 mg/dL (ref 5–40)

## 2021-04-16 LAB — HEPATIC FUNCTION PANEL
ALT: 36 IU/L — ABNORMAL HIGH (ref 0–32)
AST: 38 IU/L (ref 0–40)
Albumin: 4.3 g/dL (ref 3.8–4.9)
Alkaline Phosphatase: 69 IU/L (ref 44–121)
Bilirubin Total: 0.4 mg/dL (ref 0.0–1.2)
Bilirubin, Direct: <0.1 mg/dL (ref 0.00–0.40)
Total Protein: 7.3 g/dL (ref 6.0–8.5)

## 2021-04-19 ENCOUNTER — Other Ambulatory Visit: Payer: Self-pay | Admitting: Internal Medicine

## 2021-06-13 DIAGNOSIS — K7682 Hepatic encephalopathy: Secondary | ICD-10-CM | POA: Diagnosis not present

## 2021-06-13 DIAGNOSIS — K7581 Nonalcoholic steatohepatitis (NASH): Secondary | ICD-10-CM | POA: Diagnosis not present

## 2021-06-13 DIAGNOSIS — E119 Type 2 diabetes mellitus without complications: Secondary | ICD-10-CM | POA: Diagnosis not present

## 2021-06-13 DIAGNOSIS — K7460 Unspecified cirrhosis of liver: Secondary | ICD-10-CM | POA: Diagnosis not present

## 2021-06-14 ENCOUNTER — Other Ambulatory Visit: Payer: Self-pay | Admitting: Nurse Practitioner

## 2021-06-14 DIAGNOSIS — K7460 Unspecified cirrhosis of liver: Secondary | ICD-10-CM

## 2021-06-21 ENCOUNTER — Ambulatory Visit
Admission: RE | Admit: 2021-06-21 | Discharge: 2021-06-21 | Disposition: A | Discharge status: Home / Self Care | Payer: Medicare HMO | Source: Ambulatory Visit | Attending: Nurse Practitioner | Admitting: Nurse Practitioner

## 2021-06-21 DIAGNOSIS — K7460 Unspecified cirrhosis of liver: Secondary | ICD-10-CM

## 2021-06-21 DIAGNOSIS — K7581 Nonalcoholic steatohepatitis (NASH): Secondary | ICD-10-CM | POA: Diagnosis not present

## 2021-06-26 ENCOUNTER — Other Ambulatory Visit: Payer: Self-pay | Admitting: Nurse Practitioner

## 2021-06-26 DIAGNOSIS — D376 Neoplasm of uncertain behavior of liver, gallbladder and bile ducts: Secondary | ICD-10-CM

## 2021-07-11 ENCOUNTER — Ambulatory Visit
Admission: RE | Admit: 2021-07-11 | Discharge: 2021-07-11 | Disposition: A | Discharge status: Home / Self Care | Payer: Medicare HMO | Source: Ambulatory Visit | Attending: Nurse Practitioner | Admitting: Nurse Practitioner

## 2021-07-11 DIAGNOSIS — D376 Neoplasm of uncertain behavior of liver, gallbladder and bile ducts: Secondary | ICD-10-CM

## 2021-07-11 DIAGNOSIS — K760 Fatty (change of) liver, not elsewhere classified: Secondary | ICD-10-CM | POA: Diagnosis not present

## 2021-07-11 DIAGNOSIS — R160 Hepatomegaly, not elsewhere classified: Secondary | ICD-10-CM | POA: Diagnosis not present

## 2021-07-11 DIAGNOSIS — K7460 Unspecified cirrhosis of liver: Secondary | ICD-10-CM | POA: Diagnosis not present

## 2021-07-11 MED ORDER — GADOBENATE DIMEGLUMINE 529 MG/ML IV SOLN
13.0000 mL | Freq: Once | INTRAVENOUS | Status: AC | PRN
  Administered 2021-07-11: 13 mL via INTRAVENOUS

## 2021-08-03 DIAGNOSIS — M7061 Trochanteric bursitis, right hip: Secondary | ICD-10-CM | POA: Diagnosis not present

## 2021-08-29 ENCOUNTER — Ambulatory Visit: Payer: Medicare HMO | Admitting: Orthopedic Surgery

## 2021-09-12 ENCOUNTER — Ambulatory Visit (INDEPENDENT_AMBULATORY_CARE_PROVIDER_SITE_OTHER): Payer: Medicare HMO

## 2021-09-12 ENCOUNTER — Other Ambulatory Visit: Payer: Self-pay | Admitting: Internal Medicine

## 2021-09-12 ENCOUNTER — Ambulatory Visit (INDEPENDENT_AMBULATORY_CARE_PROVIDER_SITE_OTHER): Payer: Medicare HMO | Admitting: Orthopedic Surgery

## 2021-09-12 ENCOUNTER — Ambulatory Visit: Payer: Self-pay

## 2021-09-12 ENCOUNTER — Encounter: Payer: Self-pay | Admitting: Orthopedic Surgery

## 2021-09-12 DIAGNOSIS — M25512 Pain in left shoulder: Secondary | ICD-10-CM

## 2021-09-12 DIAGNOSIS — M19012 Primary osteoarthritis, left shoulder: Secondary | ICD-10-CM

## 2021-09-12 DIAGNOSIS — M7061 Trochanteric bursitis, right hip: Secondary | ICD-10-CM | POA: Diagnosis not present

## 2021-09-12 MED ORDER — BUPIVACAINE HCL 0.25 % IJ SOLN
0.6600 mL | INTRAMUSCULAR | Status: AC | PRN
  Administered 2021-09-12: .66 mL via INTRA_ARTICULAR

## 2021-09-12 MED ORDER — METHYLPREDNISOLONE ACETATE 40 MG/ML IJ SUSP
13.3300 mg | INTRAMUSCULAR | Status: AC | PRN
  Administered 2021-09-12: 13.33 mg via INTRA_ARTICULAR

## 2021-09-12 MED ORDER — LIDOCAINE HCL 1 % IJ SOLN
3.0000 mL | INTRAMUSCULAR | Status: AC | PRN
  Administered 2021-09-12: 3 mL

## 2021-09-12 NOTE — Progress Notes (Signed)
Office Visit Note   Patient: Emma Glenn           Date of Birth: 1965-04-30           MRN: [id number] Visit Date: 09/12/2021 Requested by: Shirline Frees, MD [address] [place],  [place] [address] PCP: Shirline Frees, MD  Subjective: Chief Complaint  Patient presents with   Right Hip - Pain   Left Shoulder - Pain    HPI: Emma Glenn is a 56 y.o. female who presents to the office complaining of right hip and left shoulder pain.  Patient has history of right hip pain has been bothering her since April.  She localizes to the lateral aspect of the right hip with no radiation.  Denies any groin pain, low back pain.  No significant numbness or tingling but she does note increased sensitivity along the lateral aspect of her hip as worse with touching blankets are closed.  She was seen at the [facility] walk-in clinic and had trochanteric injection on 08/03/2021 that gave her a maximum of 10 to 15% improvement.  She is not able to lay on her right side due to pain.  No weakness in the leg.  No alteration to her gait.  She also complains of left shoulder pain that is not bothering her as much as the hip pain but she has noticed increasing achiness over the last 6 to 7 months of the left shoulder without any history of recent injury.  Most of her pain she localizes to the superior lateral aspect of the shoulder.  She does have history of prior rotator cuff repair with biceps tenodesis and coracoid fracture fixation of the left shoulder in May 2017 with subsequent manipulation under anesthesia several months later.  She has done well since that surgery but does note occasional numbness and tingling in her hands as well as scapular pain.  She has left shoulder pain that will occasionally wake her up from sleep at night and she cannot really lay on her left shoulder due to the pain she has.  She does have history of diabetes with last A1c 5.9.  Also has history of thyroid disease and COPD.   No weakness in the arm, chest pain..                ROS: All systems reviewed are negative as they relate to the chief complaint within the history of present illness.  Patient denies fevers or chills.  Assessment & Plan: Visit Diagnoses:  1. Trochanteric bursitis, right hip   2. Left shoulder pain, unspecified chronicity     Plan: Patient is a 56 year old female who presents for evaluation of right hip pain and left shoulder pain.  She does have trochanteric tenderness on exam today consistent with greater trochanteric pain syndrome of the right lower extremity.  No concerning weakness or gait abnormality that would indicate tendinopathy of the gluteal tendons.  Does not seem like it is referred pain from her back.  With the recency of the injection, plan to have her return in 3 tweeks for repeat injection of the trochanteric bursa under ultrasound guidance   Regarding the left shoulder, most of her pain seems localized to the superior portion of the left shoulder with reproducible tenderness over the [facility] joint on exam today.  Excellent rotator cuff strength and no reproduction of pain with stressing the rotator cuff.  She does have occasional neck and shoulder blade pain which may indicate  she has some referred pain from the cervical spine, with radiographs today showing mild to moderate degenerative changes throughout the cervical spine.  After discussion of options, plan to try ultrasound-guided [facility] joint injection today.  Patient tolerated procedure well.  We will reevaluate left shoulder at the next appointment when she comes back for her trochanteric injection, but if no improvement could consider cervical spine MRI and or shoulder MRI  Follow-Up Instructions: No follow-ups on file.   Orders:  Orders Placed This Encounter  Procedures   XR HIP UNILAT W OR W/O PELVIS 2-3 VIEWS RIGHT   XR Shoulder Left   XR Cervical Spine 2 or 3 views   US Guided Needle Placement - No Linked Charges   No  orders of the defined types were placed in this encounter.     Procedures: Medium Joint Inj: L acromioclavicular on 09/12/2021 8:26 PM Indications: diagnostic evaluation and pain Details: 25 G 1.5 in needle, ultrasound-guided superior approach Medications: 3 mL lidocaine 1 %; 0.66 mL bupivacaine 0.25 %; 13.33 mg methylPREDNISolone acetate 40 MG/ML Outcome: tolerated well, no immediate complications Procedure, treatment alternatives, risks and benefits explained, specific risks discussed. Consent was given by the patient. Immediately prior to procedure a time out was called to verify the correct patient, procedure, equipment, support staff and site/side marked as required. Patient was prepped and draped in the usual sterile fashion.       Clinical Data: No additional findings.  Objective: Vital Signs: LMP 04/25/2016   Physical Exam:  Constitutional: Patient appears well-developed HEENT:  Head: Normocephalic Eyes:EOM are normal Neck: Normal range of motion Cardiovascular: Normal rate Pulmonary/chest: Effort normal Neurologic: Patient is alert Skin: Skin is warm Psychiatric: Patient has normal mood and affect  Ortho Exam: Ortho exam demonstrates left shoulder with 35 degrees X rotation, 90 degrees abduction, 150 degrees forward flexion.  This compared with the right shoulder 50 degrees X rotation, 110 degrees abduction, 180 degrees forward flexion.  Excellent rotator cuff strength of supra, infra, subscap of the left shoulder.  No crepitus noted with passive motion of the shoulder.  No tenderness over the bicipital groove.  Mild to moderate tenderness over the [facility] joint of the left shoulder with no tenderness over the right shoulder AC joint.  Negative Spurling sign, negative Lhermitte sign.  5/5 motor strength of bilateral grip strength, finger abduction, pronation/supination, bicep, tricep, deltoid.  She has slight amount of stiffness with cervical spine active range of motion but  nothing very concerning.  5/5 motor strength of bilateral hip flexion, quadricep, hamstring, dorsiflexion, plantarflexion.  Intact abduction strength and active internal rotation strength of the right hip.  No Trendelenburg gait noted.  No pain with hip range of motion.  Negative Stinchfield sign.  Negative straight leg raise bilaterally.  Moderate tenderness over the greater trochanter of the right hip.  No significant discomfort with light touch throughout the nerve distribution of the lateral femoral cutaneous nerve.  Specialty Comments:  No specialty comments available.  Imaging: No results found.   PMFS History: Patient Active Problem List   Diagnosis Date Noted   Acute CVA (cerebrovascular accident) ([place]) 03/09/2021   Elevated coronary artery calcium score 03/08/2021   Hyperlipidemia 08/27/2017   Elevated urinary free cortisol level 07/01/2016   Type 2 diabetes mellitus with hyperglycemia, without long-term current use of insulin ([place]) 07/01/2016   Abnormal EEG 07/15/2015   Rotator cuff tear 06/13/2015   Tonic-clonic generalized seizure ([place]) 05/11/2015   Seizure ([place]) 04/28/2015   Anterior shoulder  dislocation 04/28/2015   Hypertension, essential    CIN I (cervical intraepithelial neoplasia I)    Hypothyroidism    Labial cyst    Past Medical History:  Diagnosis Date   Acute stress reaction    Allergic rhinitis    Anxiety    Autoimmune thyroiditis    CIN I (cervical intraepithelial neoplasia I)    Cirrhosis of liver (HCC)    COPD (chronic obstructive pulmonary disease) (HCC)    Depression    DM (diabetes mellitus) (HCC)    GERD (gastroesophageal reflux disease)    Hemochromatosis    Hepatic steatosis    Hereditary hemochromatosis ([place])    Hyperlipidemia    Hypertension    Hypothyroidism    Labial cyst    Inclusion cyst   Liver cirrhosis (HCC)    Morbid obesity (HCC)    NASH (nonalcoholic steatohepatitis)    Positive ANA (antinuclear antibody)    Pure  hypercholesterolemia    Seizures ([place])    last seizure 3/17? over dose wellbutrin   Thyroid disease    Graves dis-Radioactive Iodine   Tobacco dependence     Family History  Problem Relation Age of Onset   Hypertension Mother    Heart disease Mother    Diabetes Father    Heart disease Father    Heart disease Brother    Breast cancer Maternal Grandmother    Uterine cancer Maternal Aunt    Seizures Neg Hx    Stomach cancer Neg Hx    Colon cancer Neg Hx     Past Surgical History:  Procedure Laterality Date   CERVICAL BIOPSY  W/ LOOP ELECTRODE EXCISION     Excision labial inclusion cyst   CESAREAN SECTION     COLPOSCOPY     HAND SURGERY     SHOULDER ARTHROSCOPY WITH OPEN ROTATOR CUFF REPAIR AND DISTAL CLAVICLE ACROMINECTOMY Left 06/13/2015   Procedure: LEFT SHOULDER ARTHROSCOPY, DEBRIDEMENT, OPEN ROTATOR CUFF REPAIR, CORACOID FRACTURE FIXATION, BICEPS TENODESIS;  Surgeon: Meredith Pel, MD;  Location: [facility];  Service: Orthopedics;  Laterality: Left;   SHOULDER CLOSED REDUCTION Left 09/26/2015   Procedure: CLOSED MANIPULATION SHOULDER UNDER ANESTHESIA;  Surgeon: Meredith Pel, MD;  Location: [facility];  Service: Orthopedics;  Laterality: Left;   TUBAL LIGATION     Social History   Occupational History   Not on file  Tobacco Use   Smoking status: Former    Packs/day: 1.00    Types: Cigarettes    Quit date: 03/23/2021    Years since quitting: 0.4   Smokeless tobacco: Never  Vaping Use   Vaping Use: Never used  Substance and Sexual Activity   Alcohol use: Yes    Alcohol/week: 0.0 standard drinks of alcohol    Comment: rare   Drug use: Yes    Types: Marijuana    Comment: Hx of marijuana use   Sexual activity: Yes    Birth control/protection: Surgical

## 2021-09-18 DIAGNOSIS — I10 Essential (primary) hypertension: Secondary | ICD-10-CM | POA: Diagnosis not present

## 2021-09-18 DIAGNOSIS — E890 Postprocedural hypothyroidism: Secondary | ICD-10-CM | POA: Diagnosis not present

## 2021-09-18 DIAGNOSIS — I679 Cerebrovascular disease, unspecified: Secondary | ICD-10-CM | POA: Diagnosis not present

## 2021-09-18 DIAGNOSIS — E1165 Type 2 diabetes mellitus with hyperglycemia: Secondary | ICD-10-CM | POA: Diagnosis not present

## 2021-09-18 DIAGNOSIS — E7800 Pure hypercholesterolemia, unspecified: Secondary | ICD-10-CM | POA: Diagnosis not present

## 2021-09-27 ENCOUNTER — Ambulatory Visit: Payer: Medicare HMO | Admitting: Internal Medicine

## 2021-10-01 DIAGNOSIS — U071 COVID-19: Secondary | ICD-10-CM | POA: Diagnosis not present

## 2021-10-01 DIAGNOSIS — E119 Type 2 diabetes mellitus without complications: Secondary | ICD-10-CM | POA: Diagnosis not present

## 2021-10-05 ENCOUNTER — Ambulatory Visit: Payer: Medicare HMO | Admitting: Orthopedic Surgery

## 2021-10-08 ENCOUNTER — Ambulatory Visit: Payer: Medicare HMO | Admitting: Internal Medicine

## 2021-10-10 ENCOUNTER — Ambulatory Visit: Payer: Medicare HMO | Admitting: Orthopedic Surgery

## 2021-10-17 ENCOUNTER — Encounter: Payer: Self-pay | Admitting: Internal Medicine

## 2021-10-17 ENCOUNTER — Ambulatory Visit (INDEPENDENT_AMBULATORY_CARE_PROVIDER_SITE_OTHER): Payer: Medicare HMO | Admitting: Internal Medicine

## 2021-10-17 VITALS — BP 118/84 | HR 86 | Ht 62.0 in | Wt 158.0 lb

## 2021-10-17 DIAGNOSIS — R7401 Elevation of levels of liver transaminase levels: Secondary | ICD-10-CM

## 2021-10-17 DIAGNOSIS — E785 Hyperlipidemia, unspecified: Secondary | ICD-10-CM

## 2021-10-17 DIAGNOSIS — E039 Hypothyroidism, unspecified: Secondary | ICD-10-CM | POA: Diagnosis not present

## 2021-10-17 DIAGNOSIS — E1165 Type 2 diabetes mellitus with hyperglycemia: Secondary | ICD-10-CM

## 2021-10-17 LAB — T4, FREE: Free T4: 1.2 ng/dL (ref 0.60–1.60)

## 2021-10-17 LAB — LIPID PANEL
Cholesterol: 227 mg/dL — ABNORMAL HIGH (ref 0–200)
HDL: 38.6 mg/dL — ABNORMAL LOW (ref >39.00)
LDL Cholesterol: 153 mg/dL — ABNORMAL HIGH (ref 0–99)
NonHDL: 188.63
Total CHOL/HDL Ratio: 6
Triglycerides: 177 mg/dL — ABNORMAL HIGH (ref 0.0–149.0)
VLDL: 35.4 mg/dL (ref 0.0–40.0)

## 2021-10-17 LAB — POCT GLYCOSYLATED HEMOGLOBIN (HGB A1C): Hemoglobin A1C: 7.1 % — AB (ref 4.0–5.6)

## 2021-10-17 LAB — MICROALBUMIN / CREATININE URINE RATIO
Creatinine,U: 97 mg/dL
Microalb Creat Ratio: 37.3 mg/g — ABNORMAL HIGH (ref 0.0–30.0)
Microalb, Ur: 36.2 mg/dL — ABNORMAL HIGH (ref 0.0–1.9)

## 2021-10-17 LAB — TSH: TSH: 9.18 u[IU]/mL — ABNORMAL HIGH (ref 0.35–5.50)

## 2021-10-17 MED ORDER — EMPAGLIFLOZIN 25 MG PO TABS: 25.0000 mg | ORAL_TABLET | Freq: Every day | ORAL | 3 refills | Status: DC

## 2021-10-17 NOTE — Patient Instructions (Addendum)
Please continue: - Metformin ER 1000 mg at night  Please increase: - Jardiance 25 mg before breakfast   Please continue levothyroxine 125 mcg daily  Take the thyroid hormone every day, with water, at least 30 minutes before breakfast, separated by at least 4 hours from: - acid reflux medications - calcium - iron - multivitamins  Please return in 4 months with your sugar log.

## 2021-10-17 NOTE — Progress Notes (Signed)
Patient ID: Emma Glenn, female   DOB: 03/03/1965, 56 y.o.   MRN: [id number]   HPI: Emma Glenn is a 56 y.o.-year-old female, initially referred by her GI Dr, Dr. Hilarie Fredrickson, returning for follow-up for uncontrolled DM2, currently insulin independent, with complications (lacunar CVA 02/2021) and hypothyroidism. Last visit 6 months ago.   Interim history: No increased urination, blurry vision, nausea, chest pain. She has diarrhea. She has trochanteric bursitis. She had 1 steroid injection 2 mo ago.  She relaxed her diet since last OV  - eating more.  DM2: Reviewed HbA1c levels: Lab Results  Component Value Date   HGBA1C 5.7 (H) 03/09/2021   HGBA1C 6.3 (A) 10/24/2020   HGBA1C 6.0 (A) 04/19/2020  Labs from [facility], drawn on 07/29/2016: HbA1c still very high, at 12.6%  Pt is on a regimen of: - Metformin ER 1000 mg with breakfast >> at night - Jardiance 10 mg before breakfast  Previously also on: -  - through patient assistance program >> ran out 11/2019 -  >> stopped 06/2019  She checks her sugars 0-1 times a day: - am: 115-128 >> 94-127 >> 110-129, 139 >> 127, 149 >> 127, 171 - 2h after b'fast:  89-188 >> 100 >> n/c >> 125, 136 >> n/c >> 114 >> n/c - before lunch:  80-91 >> 114-120 >> 115 >> 91 >> 77-112 >> n/c - 2h after lunch:  n/c >> <141 >> n/c >> 166, 169 >> 228 >> 89 >> n/c - before dinner:  109-116 >> 96-120 >> n/c >> 71, 87 >> 86, 104 >> 83 - 2h after dinner: 94-106 >> <153 >> n/c >> 162 >> 131 >> 89 >> n/c - bedtime: 90, 103  >> <150 >> 123 >> n/c >> 98 >> n/c - nighttime: n/c Lowest sugar was  71 >> 77 >> 83 >> 83; it is unclear at which level she has hypoglycemia awareness: Highest sugar was 169 >> 228 >> 149 x1 >> 171.  [name] meals are: - Breakfast: Cereals or 2 eggs and toast - Lunch: Skips - Dinner: Meat and veggies - Snacks: Fruit  Stopped milkshakes. Occasional sodas.  Meter: Bayer Contour  -No CKD, last BUN/creatinine:  Lab Results  Component Value Date    BUN 9 03/09/2021   BUN 9 04/19/2020   CREATININE 0.70 03/09/2021   CREATININE 0.78 04/19/2020    -+ HL; last set of lipids: Lab Results  Component Value Date   CHOL 116 04/16/2021   HDL 28 (L) 04/16/2021   LDLCALC 59 04/16/2021   TRIG 169 (H) 04/16/2021   CHOLHDL 4.1 04/16/2021   We restarted pravastatin 12/2016, after we consulted with her gastroenterologist, Dr. Hilarie Fredrickson.  LFTs remained stable after starting the statin but started to increase afterwards, being higher at last check in 08/2017.  Dr. Hilarie Fredrickson is aware of the above and considers these to be related to her fatty liver disease.  We continued her pravastatin >> now 80 mg Lipitor - started 02/2021. Of note, she is also seen Dr. Coralyn Pear (hepatologist) and she has liver ultrasound every 6 months.  Latest LFTs were normal at last check: Lab Results  Component Value Date   ALT 36 (H) 04/16/2021   AST 38 04/16/2021   ALKPHOS 69 04/16/2021   BILITOT 0.4 04/16/2021   ACR is fluctuating: Lab Results  Component Value Date   MICRALBCREAT 44.9 (H) 04/19/2020   MICRALBCREAT 20.2 07/17/2018   MICRALBCREAT 237.7 (H) 04/02/2018   MICRALBCREAT 16.1 02/18/2017  07/29/2016:  UACR was high, 148.9 Off Cozaar after she ran out.  - last eye exam: 03/2019: No DR  -No numbness and tingling in her feet.  She had an acute CVA on 03/09/2021:  small acute infarct in the posterior left basal ganglia >>  R arm and leg paresis >> resolved.  She stopped smoking after her stroke.  Hypothyroidism:  Pt is on levothyroxine 125 mcg daily, taken: - in am - fasting - at least 30 min from b'fast - no calcium - no iron - no multivitamins - + PPIs at bedtime - occas. - not on Biotin  Reviewed her TFTs: Lab Results  Component Value Date   TSH 2.552 03/09/2021   TSH 1.78 04/19/2020   TSH 0.33 (L) 03/10/2019   TSH 0.10 (L) 11/04/2018   TSH 0.12 (L) 07/17/2018   TSH 4.77 (H) 04/02/2018   TSH 2.65 05/21/2017   TSH 17.64 (H) 02/18/2017   TSH  4.45 06/04/2016   TSH 2.338 04/28/2015  07/29/2016: TSH 6.18.  Lab Results  Component Value Date   FREET4 1.41 04/19/2020   FREET4 1.21 03/10/2019   FREET4 1.68 (H) 11/04/2018   FREET4 1.60 07/17/2018   FREET4 1.36 05/21/2017   FREET4 0.96 02/18/2017   Reviewed previous history: She started to have severe generalized mm cramps in 03/2016.  Around that time >> RUQ pain >> HIDA scan >> GB w/o clear stones, but with sludge. She had an abd. CT scan: increased size of her liver + liver steatosis.  She was also found to have a 24-hour urine free cortisol, which returned slightly elevated: Component     Latest Ref Rng & Units 06/04/2016 06/14/2016  Cortisol (Ur), Free     4.0 - 50.0 mcg/24 h  57.0 (H)  Results received     0.63 - 2.50 g/24 h  1.27  Cortisol, Plasma     Ug/dL (collected at 5 pm) 6.8    A CT abdomen (05/2016) showed normal adrenals.  A brain MRI (04/2015) showed normal pituitary gland.  A dexamethasone suppression test was normal >> no signs of Cushing's syndrome:  Component     Latest Ref Rng & Units 07/02/2016  Cortisol - AM     mcg/dL 1.8 (L)  Dexamethasone, Serum     ng/dL 385   She had a liver Bx  >> has level 3 scarring. 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  Mother has Alzheimer's disease - lives with pt >> increased stress.  ROS: + See HPI  I reviewed pt's medications, allergies, PMH, social hx, family hx, and changes were documented in the history of present illness. Otherwise, unchanged from my initial visit note.  Past Medical History:  Diagnosis Date   Acute stress reaction    Allergic rhinitis    Anxiety    Autoimmune thyroiditis    CIN I (cervical intraepithelial neoplasia I)    Cirrhosis of liver (HCC)    COPD (chronic obstructive pulmonary disease) (HCC)    Depression    DM (diabetes mellitus) (HCC)    GERD (gastroesophageal reflux disease)    Hemochromatosis    Hepatic steatosis    Hereditary  hemochromatosis ([place])    Hyperlipidemia    Hypertension    Hypothyroidism    Labial cyst    Inclusion cyst   Liver cirrhosis (HCC)    Morbid obesity (HCC)    NASH (nonalcoholic steatohepatitis)    Positive ANA (antinuclear antibody)    Pure hypercholesterolemia  Seizures ([place])    last seizure 3/17? over dose wellbutrin   Thyroid disease    Graves dis-Radioactive Iodine   Tobacco dependence    Past Surgical History:  Procedure Laterality Date   CERVICAL BIOPSY  W/ LOOP ELECTRODE EXCISION     Excision labial inclusion cyst   CESAREAN SECTION     COLPOSCOPY     HAND SURGERY     SHOULDER ARTHROSCOPY WITH OPEN ROTATOR CUFF REPAIR AND DISTAL CLAVICLE ACROMINECTOMY Left 06/13/2015   Procedure: LEFT SHOULDER ARTHROSCOPY, DEBRIDEMENT, OPEN ROTATOR CUFF REPAIR, CORACOID FRACTURE FIXATION, BICEPS TENODESIS;  Surgeon: Meredith Pel, MD;  Location: [facility];  Service: Orthopedics;  Laterality: Left;   SHOULDER CLOSED REDUCTION Left 09/26/2015   Procedure: CLOSED MANIPULATION SHOULDER UNDER ANESTHESIA;  Surgeon: Meredith Pel, MD;  Location: [facility];  Service: Orthopedics;  Laterality: Left;   TUBAL LIGATION     Social History   Social History   Marital status: Married    Spouse name: Tommie Raymond   Number of children: 1   Years of education: 63   Occupational History   Associate Professor    Social History Main Topics   Smoking status: Former Smoker    Packs/day: 1.00    Types: Cigarettes    Quit date: 02/14/2015   Smokeless tobacco: Never Used   Alcohol use 0.0 oz/week     Comment: rare   Drug use: No     Comment: Hx of marijuana use   Sexual activity: Yes    Birth control/ protection: Surgical   Social History Narrative   Lives with husband and sister   Caffeine use: Tea/coffee daily      Current Outpatient Medications on File Prior to Visit  Medication Sig Dispense Refill   amLODipine (NORVASC) 10 MG tablet Take 1 tablet (10 mg total) by mouth daily. 30 tablet 3    aspirin EC 81 MG EC tablet Take 1 tablet (81 mg total) by mouth daily with breakfast. take Aspirin 81 mg daily along with Plavix 75 mg daily for 30 days then after that STOP the Plavix  and continue ONLY Aspirin 81 mg daily indefinitely-- 30 tablet 11   atorvastatin (LIPITOR) 80 MG tablet Take 1 tablet (80 mg total) by mouth daily. Take in place of Pravastatin 30 tablet 11   BEVESPI AEROSPHERE 9-4.8 MCG/ACT AERO INHALE 2 PUFFS INTO THE LUNGS TWICE DAILY. REPLACES ANORO INHALER. (Patient taking differently: Inhale 2 puffs into the lungs in the morning and at bedtime.) 10.7 g 6   glucose blood (BAYER CONTOUR TEST) test strip Use 2x a day 200 each 3   JARDIANCE 10 MG TABS tablet TAKE 1 TABLET EVERY DAY 90 tablet 3   levothyroxine (SYNTHROID) 125 MCG tablet TAKE 1 TABLET (125 MCG TOTAL) BY MOUTH DAILY. 90 tablet 1   losartan (COZAAR) 50 MG tablet Take 1 tablet (50 mg total) by mouth daily. 90 tablet 11   metFORMIN (GLUCOPHAGE-XR) 500 MG 24 hr tablet TAKE 2 TABLETS EVERY DAY 180 tablet 3   omeprazole (PRILOSEC) 40 MG capsule Take 1 capsule (40 mg total) by mouth daily. 30 capsule 6   SURE COMFORT PEN NEEDLES 32G X 4 MM MISC USE TO INJECT LANTUS ONCE DAILY 100 each 3   No current facility-administered medications on file prior to visit.   Allergies  Allergen Reactions   Doxycycline Other (See Comments)    Flu like symptoms (high fever, chills)   Nitrofurantoin Monohyd Macro Other (See Comments)  Flu like symptoms (high fever, chills)   Septra [Sulfamethoxazole-Trimethoprim] Other (See Comments)    Flu like symptoms (high fever, chills)   Lamictal [Lamotrigine] Rash   Family History  Problem Relation Age of Onset   Hypertension Mother    Heart disease Mother    Diabetes Father    Heart disease Father    Heart disease Brother    Breast cancer Maternal Grandmother    Uterine cancer Maternal Aunt    Seizures Neg Hx    Stomach cancer Neg Hx    Colon cancer Neg Hx    PE: BP 118/84 (BP  Location: Right Arm, Patient Position: Sitting, Cuff Size: Normal)   Pulse 86   Ht 5' 2"  (1.575 m)   Wt 158 lb (71.7 kg)   LMP 04/25/2016   SpO2 98%   BMI 28.90 kg/m  Wt Readings from Last 3 Encounters:  10/17/21 158 lb (71.7 kg)  03/27/21 145 lb 9.6 oz (66 kg)  03/27/21 146 lb 3.2 oz (66.3 kg)   Constitutional: normal weight, in NAD Eyes: EOMI, no exophthalmos ENT: moist mucous membranes, no thyromegaly, no cervical lymphadenopathy Cardiovascular: RRR, No MRG Respiratory: CTA B Musculoskeletal: no deformities Skin: moist, warm, no rashes Neurological: no tremor with outstretched hands Diabetic Foot Exam - Simple   Simple Foot Form Diabetic Foot exam was performed with the following findings: Yes 10/17/2021 11:00 AM  Visual Inspection No deformities, no ulcerations, no other skin breakdown bilaterally: Yes Sensation Testing Intact to touch and monofilament testing bilaterally: Yes Pulse Check Posterior Tibialis and Dorsalis pulse intact bilaterally: Yes Comments Dry skin    ASSESSMENT: 1. DM2, non-insulin-dependent, with complications -acute CVA -03/09/2021  2. Hypothyroidism  -Post ablative, after RAI treatment for Graves' disease  3. HL  PLAN: 1. DM2 -Patient with longstanding, uncontrolled, type 2 diabetes, on oral antidiabetic regimen with metformin and SGLT2 inhibitor.  She was previously on a GLP-1 receptor agonist and insulin but she did not have to restart these after running out of Trulicity and stopping Lantus due to good control.  HbA1c at last visit was 5.7%, improved, excellent.  She was not checking blood sugars and I advised her to check daily or every other day.  We did not change her regimen. -At today's visit, she is not checking sugars consistently.  She has a few values checked of which 1 was above target in the morning.  For now, we discussed about increasing the Jardiance dose to 25 mg daily but otherwise continue the current regimen.  In the meantime,  she will start working on her diet and check her blood sugars daily, rotating check times. - I suggested to:  Patient Instructions  Please continue: - Metformin ER 1000 mg at night  Please increase: - Jardiance 25 mg before breakfast   Please continue levothyroxine 125 mcg daily  Take the thyroid hormone every day, with water, at least 30 minutes before breakfast, separated by at least 4 hours from: - acid reflux medications - calcium - iron - multivitamins  Please return in 4 months with your sugar log.   - we checked her HbA1c: 7.1% (higher) - advised to check sugars at different times of the day - 1x a day, rotating check times - advised for yearly eye exams >> she is not UTD - will check annual labs today - return to clinic in 4 months      2. Hypothyroidism -Developed after RAI ablation - latest thyroid labs reviewed with pt. >> normal:  Lab Results  Component Value Date   TSH 2.552 03/09/2021  - she continues on LT4 125 mcg daily - pt feels good on this dose. - we discussed about taking the thyroid hormone every day, with water, >30 minutes before breakfast, separated by >4 hours from acid reflux medications, calcium, iron, multivitamins. Pt. is taking it correctly. - will check thyroid tests now  3. HL  -Reviewed latest lipid panel from 04/2021: LDL above target of less than 55 due to history of cardiovascular disease, triglycerides slightly high, HDL low: Lab Results  Component Value Date   CHOL 116 04/16/2021   HDL 28 (L) 04/16/2021   LDLCALC 59 04/16/2021   TRIG 169 (H) 04/16/2021   CHOLHDL 4.1 04/16/2021  -Before last visit, she changed to atorvastatin 80 mg daily -she continues on this with good tolerance -We will check a lipid panel today.  She is fasting.  Component     Latest Ref Rng 10/17/2021  Sodium     135 - 146 mmol/L 141   Potassium     3.5 - 5.3 mmol/L 4.1   Chloride     98 - 110 mmol/L 104   CO2     20 - 32 mmol/L 22   Glucose     65 - 99  mg/dL 102 (H)   BUN     7 - 25 mg/dL 9   Creatinine     0.50 - 1.03 mg/dL 0.68   Total Bilirubin     0.2 - 1.2 mg/dL 0.6   AST     10 - 35 U/L 43 (H)   ALT     6 - 29 U/L 67 (H)   Total Protein     6.1 - 8.1 g/dL 7.9   Calcium     8.6 - 10.4 mg/dL 9.5   Cholesterol     0 - 200 mg/dL 227 (H)   HDL Cholesterol     >39.00 mg/dL 38.60 (L)   Triglycerides     0.0 - 149.0 mg/dL 177.0 (H)   Total CHOL/HDL Ratio 6   Microalb, Ur     0.0 - 1.9 mg/dL 36.2 (H)   Creatinine,U     mg/dL 97.0   MICROALB/CREAT RATIO     0.0 - 30.0 mg/g 37.3 (H)   VLDL     0.0 - 40.0 mg/dL 35.4   LDL (calc)     0 - 99 mg/dL 153 (H)   NonHDL 188.63   TSH     0.35 - 5.50 uIU/mL 9.18 (H)   T4,Free(Direct)     0.60 - 1.60 ng/dL 1.20   Hemoglobin A1C     4.0 - 5.6 % 7.1 !   BUN/Creatinine Ratio     6 - 22 (calc) SEE NOTE:   Albumin MSPROF     3.6 - 5.1 g/dL 4.5   Globulin     1.9 - 3.7 g/dL (calc) 3.4   AG Ratio     1.0 - 2.5 (calc) 1.3   Alkaline phosphatase (APISO)     37 - 153 U/L 67   eGFR     > OR = 60 mL/min/1.42m 102     Her TSH is elevated.  I will advise her to increase her levothyroxine dose to 137 mcg daily and recheck her TFTs in 2 mo. Lipids are all abnormal, with much worse LDL.  I will check with her if she is taking the Lipitor daily.  LFTs are elevated higher than  before.  She absolutely needs to improve her diet. We will repeat them in 2 months.  Philemon Kingdom, MD PhD [facility] End[facility]

## 2021-10-18 ENCOUNTER — Ambulatory Visit (INDEPENDENT_AMBULATORY_CARE_PROVIDER_SITE_OTHER): Payer: Medicare HMO | Admitting: Surgical

## 2021-10-18 ENCOUNTER — Ambulatory Visit: Payer: Self-pay

## 2021-10-18 ENCOUNTER — Encounter: Payer: Self-pay | Admitting: Surgical

## 2021-10-18 DIAGNOSIS — M7061 Trochanteric bursitis, right hip: Secondary | ICD-10-CM | POA: Diagnosis not present

## 2021-10-18 DIAGNOSIS — M19012 Primary osteoarthritis, left shoulder: Secondary | ICD-10-CM

## 2021-10-18 LAB — COMPLETE METABOLIC PANEL WITH GFR
AG Ratio: 1.3 (calc) (ref 1.0–2.5)
ALT: 67 U/L — ABNORMAL HIGH (ref 6–29)
AST: 43 U/L — ABNORMAL HIGH (ref 10–35)
Albumin: 4.5 g/dL (ref 3.6–5.1)
Alkaline phosphatase (APISO): 67 U/L (ref 37–153)
BUN: 9 mg/dL (ref 7–25)
CO2: 22 mmol/L (ref 20–32)
Calcium: 9.5 mg/dL (ref 8.6–10.4)
Chloride: 104 mmol/L (ref 98–110)
Creat: 0.68 mg/dL (ref 0.50–1.03)
Globulin: 3.4 g/dL (calc) (ref 1.9–3.7)
Glucose, Bld: 102 mg/dL — ABNORMAL HIGH (ref 65–99)
Potassium: 4.1 mmol/L (ref 3.5–5.3)
Sodium: 141 mmol/L (ref 135–146)
Total Bilirubin: 0.6 mg/dL (ref 0.2–1.2)
Total Protein: 7.9 g/dL (ref 6.1–8.1)
eGFR: 102 mL/min/{1.73_m2} (ref >=60)

## 2021-10-18 MED ORDER — LEVOTHYROXINE SODIUM 137 MCG PO TABS: 137.0000 ug | ORAL_TABLET | Freq: Every day | ORAL | 5 refills | Status: DC

## 2021-10-18 NOTE — Progress Notes (Signed)
Office Visit Note   Patient: Emma Glenn           Date of Birth: 1965/06/23           MRN: [id number] Visit Date: 10/18/2021 Requested by: Shirline Frees, MD [address] [place],  [place] [address] PCP: Shirline Frees, MD  Subjective: Chief Complaint  Patient presents with   Left Shoulder - Follow-up   Right Hip - Follow-up    HPI: Emma Glenn is a 56 y.o. female who presents to the office for follow-up following left AC joint injection on 09/12/2021.  She states that her left shoulder pain is pretty much completely resolved.  She can lay on her left side at night.  She has no pain in the shoulder pain longer.  She is very satisfied with how she is doing following this injection and has not had any recurrent of symptoms.  She also returns for planned right trochanteric injection for greater trochanteric pain syndrome but she states that her current pain is rated 1/10 and only bothers her intermittently when she tries to lay on her right side at night.  No groin pain.  No radicular pain.  Just localized to the lateral hip..                ROS: All systems reviewed are negative as they relate to the chief complaint within the history of present illness.  Patient denies fevers or chills.  Assessment & Plan: Visit Diagnoses:  1. Arthritis of left acromioclavicular joint   2. Trochanteric bursitis, right hip     Plan: Patient is a 56 year old female who presents for evaluation of left shoulder pain following left AC joint injection.  Injection gave her 100% relief of her symptoms.  She has had no recurrence since then.  She will follow-up as needed for her shoulder pain.  Regarding her right hip pain that she localizes to the trochanter, currently her symptoms are 1/10 and not bothering her enough for any intervention though an injection was planned today.  Due to the lack of moderate to severe symptoms, plan to hold off on injection today and she was counseled to try  some gluteal musculature strengthening exercises and IT band stretching exercises to help treat her residual symptoms.  There is a very small but not negligible risk of damage to the gluteal tendons with any trochanteric injection so plan to hold off on injection today unless her symptoms get significantly worse or fail to resolve with these exercises.  She will reach out in 4 to 6 weeks if no improvement.  Follow-Up Instructions: No follow-ups on file.   Orders:  No orders of the defined types were placed in this encounter.  No orders of the defined types were placed in this encounter.     Procedures: No procedures performed   Clinical Data: No additional findings.  Objective: Vital Signs: LMP 04/25/2016   Physical Exam:  Constitutional: Patient appears well-developed HEENT:  Head: Normocephalic Eyes:EOM are normal Neck: Normal range of motion Cardiovascular: Normal rate Pulmonary/chest: Effort normal Neurologic: Patient is alert Skin: Skin is warm Psychiatric: Patient has normal mood and affect  Ortho Exam: Ortho exam demonstrates left shoulder with 45 degrees external rotation, 90 degrees abduction, 140 degrees forward flexion.  Active range of motion is limited passive range of motion.  Excellent rotator cuff strength of supra, infra, subscap.  No tenderness over the [facility] joint.  No pain with crossarm adduction.  Specialty  Comments:  No specialty comments available.  Imaging: No results found.   PMFS History: Patient Active Problem List   Diagnosis Date Noted   Acute CVA (cerebrovascular accident) ([place]) 03/09/2021   Elevated coronary artery calcium score 03/08/2021   Hyperlipidemia 08/27/2017   Elevated urinary free cortisol level 07/01/2016   Type 2 diabetes mellitus with hyperglycemia, without long-term current use of insulin ([place]) 07/01/2016   Abnormal EEG 07/15/2015   Rotator cuff tear 06/13/2015   Tonic-clonic generalized seizure ([place]) 05/11/2015   Seizure  ([place]) 04/28/2015   Anterior shoulder dislocation 04/28/2015   Hypertension, essential    CIN I (cervical intraepithelial neoplasia I)    Hypothyroidism    Labial cyst    Past Medical History:  Diagnosis Date   Acute stress reaction    Allergic rhinitis    Anxiety    Autoimmune thyroiditis    CIN I (cervical intraepithelial neoplasia I)    Cirrhosis of liver (HCC)    COPD (chronic obstructive pulmonary disease) (HCC)    Depression    DM (diabetes mellitus) (HCC)    GERD (gastroesophageal reflux disease)    Hemochromatosis    Hepatic steatosis    Hereditary hemochromatosis ([place])    Hyperlipidemia    Hypertension    Hypothyroidism    Labial cyst    Inclusion cyst   Liver cirrhosis (HCC)    Morbid obesity (HCC)    NASH (nonalcoholic steatohepatitis)    Positive ANA (antinuclear antibody)    Pure hypercholesterolemia    Seizures ([place])    last seizure 3/17? over dose wellbutrin   Thyroid disease    Graves dis-Radioactive Iodine   Tobacco dependence     Family History  Problem Relation Age of Onset   Hypertension Mother    Heart disease Mother    Diabetes Father    Heart disease Father    Heart disease Brother    Breast cancer Maternal Grandmother    Uterine cancer Maternal Aunt    Seizures Neg Hx    Stomach cancer Neg Hx    Colon cancer Neg Hx     Past Surgical History:  Procedure Laterality Date   CERVICAL BIOPSY  W/ LOOP ELECTRODE EXCISION     Excision labial inclusion cyst   CESAREAN SECTION     COLPOSCOPY     HAND SURGERY     SHOULDER ARTHROSCOPY WITH OPEN ROTATOR CUFF REPAIR AND DISTAL CLAVICLE ACROMINECTOMY Left 06/13/2015   Procedure: LEFT SHOULDER ARTHROSCOPY, DEBRIDEMENT, OPEN ROTATOR CUFF REPAIR, CORACOID FRACTURE FIXATION, BICEPS TENODESIS;  Surgeon: Meredith Pel, MD;  Location: [facility];  Service: Orthopedics;  Laterality: Left;   SHOULDER CLOSED REDUCTION Left 09/26/2015   Procedure: CLOSED MANIPULATION SHOULDER UNDER ANESTHESIA;  Surgeon: Meredith Pel, MD;  Location: [facility];  Service: Orthopedics;  Laterality: Left;   TUBAL LIGATION     Social History   Occupational History   Not on file  Tobacco Use   Smoking status: Former    Packs/day: 1.00    Types: Cigarettes    Quit date: 03/23/2021    Years since quitting: 0.5   Smokeless tobacco: Never  Vaping Use   Vaping Use: Never used  Substance and Sexual Activity   Alcohol use: Yes    Alcohol/week: 0.0 standard drinks of alcohol    Comment: rare   Drug use: Yes    Types: Marijuana    Comment: Hx of marijuana use   Sexual activity: Yes    Birth control/protection: Surgical

## 2021-10-31 ENCOUNTER — Other Ambulatory Visit: Payer: Self-pay | Admitting: Internal Medicine

## 2021-12-17 ENCOUNTER — Other Ambulatory Visit: Payer: Self-pay | Admitting: Nurse Practitioner

## 2021-12-17 DIAGNOSIS — D376 Neoplasm of uncertain behavior of liver, gallbladder and bile ducts: Secondary | ICD-10-CM | POA: Diagnosis not present

## 2021-12-17 DIAGNOSIS — K7581 Nonalcoholic steatohepatitis (NASH): Secondary | ICD-10-CM | POA: Diagnosis not present

## 2021-12-17 DIAGNOSIS — K7460 Unspecified cirrhosis of liver: Secondary | ICD-10-CM

## 2021-12-21 ENCOUNTER — Other Ambulatory Visit (INDEPENDENT_AMBULATORY_CARE_PROVIDER_SITE_OTHER): Payer: Medicare HMO

## 2021-12-21 DIAGNOSIS — E039 Hypothyroidism, unspecified: Secondary | ICD-10-CM

## 2021-12-21 DIAGNOSIS — R7401 Elevation of levels of liver transaminase levels: Secondary | ICD-10-CM | POA: Diagnosis not present

## 2021-12-21 LAB — T4, FREE: Free T4: 1.34 ng/dL (ref 0.60–1.60)

## 2021-12-21 LAB — HEPATIC FUNCTION PANEL
ALT: 50 U/L — ABNORMAL HIGH (ref 0–35)
AST: 56 U/L — ABNORMAL HIGH (ref 0–37)
Albumin: 4.7 g/dL (ref 3.5–5.2)
Alkaline Phosphatase: 70 U/L (ref 39–117)
Bilirubin, Direct: 0.1 mg/dL (ref 0.0–0.3)
Total Bilirubin: 0.5 mg/dL (ref 0.2–1.2)
Total Protein: 8.5 g/dL — ABNORMAL HIGH (ref 6.0–8.3)

## 2021-12-21 LAB — TSH: TSH: 6.62 u[IU]/mL — ABNORMAL HIGH (ref 0.35–5.50)

## 2022-01-11 ENCOUNTER — Ambulatory Visit
Admission: RE | Admit: 2022-01-11 | Discharge: 2022-01-11 | Disposition: A | Discharge status: Home / Self Care | Payer: Medicare HMO | Source: Ambulatory Visit | Attending: Nurse Practitioner | Admitting: Nurse Practitioner

## 2022-01-11 DIAGNOSIS — K7460 Unspecified cirrhosis of liver: Secondary | ICD-10-CM

## 2022-01-11 DIAGNOSIS — K760 Fatty (change of) liver, not elsewhere classified: Secondary | ICD-10-CM | POA: Diagnosis not present

## 2022-01-11 MED ORDER — GADOPICLENOL 0.5 MMOL/ML IV SOLN
7.0000 mL | Freq: Once | INTRAVENOUS | Status: AC | PRN
  Administered 2022-01-11: 7 mL via INTRAVENOUS

## 2022-02-12 DIAGNOSIS — H35363 Drusen (degenerative) of macula, bilateral: Secondary | ICD-10-CM | POA: Diagnosis not present

## 2022-02-12 DIAGNOSIS — H2513 Age-related nuclear cataract, bilateral: Secondary | ICD-10-CM | POA: Diagnosis not present

## 2022-02-12 DIAGNOSIS — H25013 Cortical age-related cataract, bilateral: Secondary | ICD-10-CM | POA: Diagnosis not present

## 2022-02-12 DIAGNOSIS — E119 Type 2 diabetes mellitus without complications: Secondary | ICD-10-CM | POA: Diagnosis not present

## 2022-02-12 DIAGNOSIS — G43B0 Ophthalmoplegic migraine, not intractable: Secondary | ICD-10-CM | POA: Diagnosis not present

## 2022-02-12 DIAGNOSIS — H524 Presbyopia: Secondary | ICD-10-CM | POA: Diagnosis not present

## 2022-02-18 ENCOUNTER — Encounter: Payer: Self-pay | Admitting: Internal Medicine

## 2022-02-18 ENCOUNTER — Ambulatory Visit (INDEPENDENT_AMBULATORY_CARE_PROVIDER_SITE_OTHER): Payer: Medicare HMO | Admitting: Internal Medicine

## 2022-02-18 VITALS — BP 130/84 | HR 82 | Ht 62.0 in | Wt 163.6 lb

## 2022-02-18 DIAGNOSIS — E039 Hypothyroidism, unspecified: Secondary | ICD-10-CM

## 2022-02-18 DIAGNOSIS — E785 Hyperlipidemia, unspecified: Secondary | ICD-10-CM

## 2022-02-18 DIAGNOSIS — E1165 Type 2 diabetes mellitus with hyperglycemia: Secondary | ICD-10-CM | POA: Diagnosis not present

## 2022-02-18 LAB — T4, FREE: Free T4: 1.08 ng/dL (ref 0.60–1.60)

## 2022-02-18 LAB — POCT GLYCOSYLATED HEMOGLOBIN (HGB A1C): Hemoglobin A1C: 8.1 % — AB (ref 4.0–5.6)

## 2022-02-18 LAB — TSH: TSH: 7.47 u[IU]/mL — ABNORMAL HIGH (ref 0.35–5.50)

## 2022-02-18 MED ORDER — EMPAGLIFLOZIN 25 MG PO TABS: 25.0000 mg | ORAL_TABLET | Freq: Every day | ORAL | 3 refills | Status: DC

## 2022-02-18 MED ORDER — TRULICITY 1.5 MG/0.5ML ~~LOC~~ SOAJ: 1.5000 mg | SUBCUTANEOUS | 3 refills | Status: DC

## 2022-02-18 NOTE — Patient Instructions (Addendum)
Please continue: - Metformin ER 1000 mg at night - Jardiance 25 mg before breakfast   Please restart: - Trulicity 1.5 mg weekly  Please continue levothyroxine 125 mcg daily  Take the thyroid hormone every day, with water, at least 30 minutes before breakfast, separated by at least 4 hours from: - acid reflux medications - calcium - iron - multivitamins  Please return in 4 months with your sugar log.

## 2022-02-18 NOTE — Progress Notes (Signed)
Patient ID: TAMIA DIAL, female   DOB: Jan 29, 1966, 57 y.o.   MRN: [id number]   HPI: Emma Glenn is a 57 y.o.-year-old female, initially referred by her GI Dr, Dr. Hilarie Fredrickson, returning for follow-up for uncontrolled DM2, currently insulin independent, with complications (lacunar CVA 02/2021) and hypothyroidism. Last visit 4 months ago.  1 insurance: [employer] 2nd insurance: [employer]  Interim history: No increased urination, blurry vision, nausea, chest pain.   She relaxed her diet over the holidays.  She gained weight, approximately 5 pounds, since last visit.  DM2: Reviewed HbA1c levels: Lab Results  Component Value Date   HGBA1C 7.1 (A) 10/17/2021   HGBA1C 5.7 (H) 03/09/2021   HGBA1C 6.3 (A) 10/24/2020  Labs from [place], drawn on 07/29/2016: HbA1c still very high, at 12.6%  Pt is on a regimen of: - Metformin ER 1000 mg with breakfast >> at night - Jardiance 10 >> 25 mg before breakfast  Previously also on: -  - through patient assistance program >> ran out 11/2019 -  >> stopped 06/2019  She checks her sugars 0-1 times a day: - am: 110-129, 139 >> 127, 149 >> 127, 171 >> 101-200 - 2h after b'fast:  125, 136 >> n/c >> 114 >> n/c - before lunch:  115 >> 91 >> 77-112 >> n/c - 2h after lunch: 166, 169 >> 228 >> 89 >> n/c >> 118 - before dinner:  n/c >> 71, 87 >> 86, 104 >> 83 >> 132 - 2h after dinner: n/c >> 162 >> 131 >> 89 >> n/c - bedtime:  <150 >> 123 >> n/c >> 98 >> n/c - nighttime: n/c Lowest sugar was  71 >> 77 >> 83 >> 83 >> 101; it is unclear at which level she has hypoglycemia awareness: Highest sugar was 169 >> 228 >> 149 x1 >> 171 >> 200.  [name] meals are: - Breakfast: Cereals or 2 eggs and toast - Lunch: Skips - Dinner: Meat and veggies - Snacks: Fruit  Stopped milkshakes. Occasional sodas.  Meter: Bayer Contour  -No CKD, last BUN/creatinine:  Lab Results  Component Value Date   BUN 9 10/17/2021   BUN 9 03/09/2021   CREATININE 0.68 10/17/2021    CREATININE 0.70 03/09/2021    -+ HL; last set of lipids: Lab Results  Component Value Date   CHOL 227 (H) 10/17/2021   HDL 38.60 (L) 10/17/2021   LDLCALC 153 (H) 10/17/2021   TRIG 177.0 (H) 10/17/2021   CHOLHDL 6 10/17/2021   We restarted pravastatin 12/2016, after we consulted with her gastroenterologist, Dr. Hilarie Fredrickson.  LFTs remained stable after starting the statin but started to increase afterwards, being higher at last check in 08/2017.  Dr. Hilarie Fredrickson is aware of the above and considers these to be related to her fatty liver disease.  We continued her pravastatin >> 80 mg Lipitor daily- started 02/2021. Of note, she is also seen Dr. Coralyn Pear (hepatologist) and she has liver ultrasound every 6 months.  Latest LFTs were normal at last check: Lab Results  Component Value Date   ALT 50 (H) 12/21/2021   AST 56 (H) 12/21/2021   ALKPHOS 70 12/21/2021   BILITOT 0.5 12/21/2021   ACR is fluctuating: Lab Results  Component Value Date   MICRALBCREAT 37.3 (H) 10/17/2021   MICRALBCREAT 44.9 (H) 04/19/2020   MICRALBCREAT 20.2 07/17/2018   MICRALBCREAT 237.7 (H) 04/02/2018   MICRALBCREAT 16.1 02/18/2017  07/29/2016: UACR was high, 148.9 Off Cozaar after she ran out.  -  last eye exam: 02/12/2022: No DR reportedly.  -No numbness and tingling in her feet.  Last foot exam 10/17/2021.  She had an acute CVA on 03/09/2021:  small acute infarct in the posterior left basal ganglia >>  R arm and leg paresis >> resolved.  She stopped smoking after her stroke.  Hypothyroidism:  Pt is on levothyroxine 137 mcg daily, taken: - in am - fasting - at least 30 min from b'fast - no calcium - no iron - no multivitamins - + PPIs at bedtime - occas. - not on Biotin  Reviewed her TFTs: Lab Results  Component Value Date   TSH 6.62 (H) 12/21/2021   TSH 9.18 (H) 10/17/2021   TSH 2.552 03/09/2021   TSH 1.78 04/19/2020   TSH 0.33 (L) 03/10/2019   TSH 0.10 (L) 11/04/2018   TSH 0.12 (L) 07/17/2018   TSH  4.77 (H) 04/02/2018   TSH 2.65 05/21/2017   TSH 17.64 (H) 02/18/2017   TSH 4.45 06/04/2016   TSH 2.338 04/28/2015  07/29/2016: TSH 6.18.  Lab Results  Component Value Date   FREET4 1.34 12/21/2021   FREET4 1.20 10/17/2021   FREET4 1.41 04/19/2020   FREET4 1.21 03/10/2019   FREET4 1.68 (H) 11/04/2018   FREET4 1.60 07/17/2018   FREET4 1.36 05/21/2017   FREET4 0.96 02/18/2017   Reviewed previous history: She started to have severe generalized mm cramps in 03/2016.  Around that time >> RUQ pain >> HIDA scan >> GB w/o clear stones, but with sludge. She had an abd. CT scan: increased size of her liver + liver steatosis.  She was also found to have a 24-hour urine free cortisol, which returned slightly elevated: Component     Latest Ref Rng & Units 06/04/2016 06/14/2016  Cortisol (Ur), Free     4.0 - 50.0 mcg/24 h  57.0 (H)  Results received     0.63 - 2.50 g/24 h  1.27  Cortisol, Plasma     Ug/dL (collected at 5 pm) 6.8    A CT abdomen (05/2016) showed normal adrenals.  A brain MRI (04/2015) showed normal pituitary gland.  A dexamethasone suppression test was normal >> no signs of Cushing's syndrome:  Component     Latest Ref Rng & Units 07/02/2016  Cortisol - AM     mcg/dL 1.8 (L)  Dexamethasone, Serum     ng/dL 385   She had a liver Bx  >> has level 3 scarring. 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  ROS: + See HPI  I reviewed pt's medications, allergies, PMH, social hx, family hx, and changes were documented in the history of present illness. Otherwise, unchanged from my initial visit note.  Past Medical History:  Diagnosis Date   Acute stress reaction    Allergic rhinitis    Anxiety    Autoimmune thyroiditis    CIN I (cervical intraepithelial neoplasia I)    Cirrhosis of liver (HCC)    COPD (chronic obstructive pulmonary disease) (HCC)    Depression    DM (diabetes mellitus) (HCC)    GERD (gastroesophageal reflux disease)     Hemochromatosis    Hepatic steatosis    Hereditary hemochromatosis ([place])    Hyperlipidemia    Hypertension    Hypothyroidism    Labial cyst    Inclusion cyst   Liver cirrhosis (HCC)    Morbid obesity (HCC)    NASH (nonalcoholic steatohepatitis)    Positive ANA (antinuclear antibody)  Pure hypercholesterolemia    Seizures ([place])    last seizure 3/17? over dose wellbutrin   Thyroid disease    Graves dis-Radioactive Iodine   Tobacco dependence    Past Surgical History:  Procedure Laterality Date   CERVICAL BIOPSY  W/ LOOP ELECTRODE EXCISION     Excision labial inclusion cyst   CESAREAN SECTION     COLPOSCOPY     HAND SURGERY     SHOULDER ARTHROSCOPY WITH OPEN ROTATOR CUFF REPAIR AND DISTAL CLAVICLE ACROMINECTOMY Left 06/13/2015   Procedure: LEFT SHOULDER ARTHROSCOPY, DEBRIDEMENT, OPEN ROTATOR CUFF REPAIR, CORACOID FRACTURE FIXATION, BICEPS TENODESIS;  Surgeon: Meredith Pel, MD;  Location: [facility];  Service: Orthopedics;  Laterality: Left;   SHOULDER CLOSED REDUCTION Left 09/26/2015   Procedure: CLOSED MANIPULATION SHOULDER UNDER ANESTHESIA;  Surgeon: Meredith Pel, MD;  Location: [facility];  Service: Orthopedics;  Laterality: Left;   TUBAL LIGATION     Social History   Social History   Marital status: Married    Spouse name: Tommie Raymond   Number of children: 1   Years of education: 1   Occupational History   Associate Professor    Social History Main Topics   Smoking status: Former Smoker    Packs/day: 1.00    Types: Cigarettes    Quit date: 02/14/2015   Smokeless tobacco: Never Used   Alcohol use 0.0 oz/week     Comment: rare   Drug use: No     Comment: Hx of marijuana use   Sexual activity: Yes    Birth control/ protection: Surgical   Social History Narrative   Lives with husband and sister   Caffeine use: Tea/coffee daily      Current Outpatient Medications on File Prior to Visit  Medication Sig Dispense Refill   amLODipine (NORVASC) 10 MG tablet Take 1  tablet (10 mg total) by mouth daily. 30 tablet 3   aspirin EC 81 MG EC tablet Take 1 tablet (81 mg total) by mouth daily with breakfast. take Aspirin 81 mg daily along with Plavix 75 mg daily for 30 days then after that STOP the Plavix  and continue ONLY Aspirin 81 mg daily indefinitely-- 30 tablet 11   atorvastatin (LIPITOR) 80 MG tablet Take 1 tablet (80 mg total) by mouth daily. Take in place of Pravastatin 30 tablet 11   BEVESPI AEROSPHERE 9-4.8 MCG/ACT AERO INHALE 2 PUFFS INTO THE LUNGS TWICE DAILY. REPLACES ANORO INHALER. (Patient taking differently: Inhale 2 puffs into the lungs in the morning and at bedtime.) 10.7 g 6   empagliflozin (JARDIANCE) 25 MG TABS tablet Take 1 tablet (25 mg total) by mouth daily. 90 tablet 3   glucose blood (BAYER CONTOUR TEST) test strip Use 2x a day 200 each 3   levothyroxine (SYNTHROID) 137 MCG tablet Take 1 tablet (137 mcg total) by mouth daily before breakfast. 60 tablet 5   losartan (COZAAR) 50 MG tablet Take 1 tablet (50 mg total) by mouth daily. 90 tablet 11   metFORMIN (GLUCOPHAGE-XR) 500 MG 24 hr tablet TAKE 2 TABLETS EVERY DAY 180 tablet 3   omeprazole (PRILOSEC) 40 MG capsule Take 1 capsule (40 mg total) by mouth daily. 30 capsule 6   SURE COMFORT PEN NEEDLES 32G X 4 MM MISC USE TO INJECT LANTUS ONCE DAILY 100 each 3   No current facility-administered medications on file prior to visit.   Allergies  Allergen Reactions   Doxycycline Other (See Comments)    Flu like symptoms (high fever,  chills)   Nitrofurantoin Monohyd Macro Other (See Comments)    Flu like symptoms (high fever, chills)   Septra [Sulfamethoxazole-Trimethoprim] Other (See Comments)    Flu like symptoms (high fever, chills)   Lamictal [Lamotrigine] Rash   Family History  Problem Relation Age of Onset   Hypertension Mother    Heart disease Mother    Diabetes Father    Heart disease Father    Heart disease Brother    Breast cancer Maternal Grandmother    Uterine cancer  Maternal Aunt    Seizures Neg Hx    Stomach cancer Neg Hx    Colon cancer Neg Hx    PE: BP 130/84 (BP Location: Left Arm, Patient Position: Sitting, Cuff Size: Normal)   Pulse 82   Ht '5\' 2"'$  (1.575 m)   Wt 163 lb 9.6 oz (74.2 kg)   LMP 04/25/2016   SpO2 96%   BMI 29.92 kg/m  Wt Readings from Last 3 Encounters:  02/18/22 163 lb 9.6 oz (74.2 kg)  10/17/21 158 lb (71.7 kg)  03/27/21 145 lb 9.6 oz (66 kg)   Constitutional: normal weight, in NAD Eyes: EOMI, no exophthalmos ENT: no thyromegaly, no cervical lymphadenopathy Cardiovascular: RRR, No MRG Respiratory: CTA B Musculoskeletal: no deformities Skin: no rashes Neurological: no tremor with outstretched hands  ASSESSMENT: 1. DM2, non-insulin-dependent, with complications -acute CVA -03/09/2021  2. Hypothyroidism  -Post ablative, after RAI treatment for Graves' disease  3. HL  PLAN: 1. DM2 -Patient with uncontrolled, type 2 diabetes diabetic regimen with metformin and SGLT2 inhibitor.  At last visit, we increased her Jardiance dose.  She was previously on the GLP-1 receptor and insulin but she did not have to restart due to good control.  At last visit, however, HbA1c was higher, at times.  She was not checking sugars consistently and I advised her to start.  We also discussed about the absolute need to improve diet. -At today's visit, she is not checking sugars consistently.  She has only 3-4 blood sugars in the last month.  Most of these are above target.  She still has dietary indiscretions but plans to improve her diet now after the holidays.  I also suggested to restart Trulicity, which was helping tremendously in the past.  She was getting it through the patient assistance program but feels that maybe her secondary insurance may take this, since Vania Rea is now free for her. - I suggested to:  Patient Instructions  Please continue: - Metformin ER 1000 mg at night - Jardiance 25 mg before breakfast   Please restart: -  Trulicity 1.5 mg weekly  Please continue levothyroxine 125 mcg daily  Take the thyroid hormone every day, with water, at least 30 minutes before breakfast, separated by at least 4 hours from: - acid reflux medications - calcium - iron - multivitamins  Please return in 4 months with your sugar log.   - we checked her HbA1c: 8.1% (higher) - advised to check sugars at different times of the day - 1x a day, rotating check times - advised for yearly eye exams >> she is UTD - return to clinic in 4 months      2. Hypothyroidism -Developed after RAI ablation - latest thyroid labs reviewed with pt. >> still elevated despite the increase in dose at last visit, in 10/2018 Lab Results  Component Value Date   TSH 6.62 (H) 12/21/2021  - she continues on LT4 137 mcg daily, dose increased at last visit. - pt  feels good on this dose. - we discussed about taking the thyroid hormone every day, with water, >30 minutes before breakfast, separated by >4 hours from acid reflux medications, calcium, iron, multivitamins. Pt. is taking it correctly. - will check thyroid tests today: TSH and fT4 - If labs are abnormal, she will need to return for repeat TFTs in 1.5 months  3. HL  -Reviewed latest lipid panel from last visit: All fractions abnormal. Lab Results  Component Value Date   CHOL 227 (H) 10/17/2021   HDL 38.60 (L) 10/17/2021   LDLCALC 153 (H) 10/17/2021   TRIG 177.0 (H) 10/17/2021   CHOLHDL 6 10/17/2021  -She is on atorvastatin 80 mg daily with good tolerance  Philemon Kingdom, MD PhD [facility] Endocrinology

## 2022-02-19 MED ORDER — LEVOTHYROXINE SODIUM 150 MCG PO TABS: 137.0000 ug | ORAL_TABLET | Freq: Every day | ORAL | 3 refills | Status: DC

## 2022-02-19 NOTE — Addendum Note (Signed)
Addended by: Philemon Kingdom on: 02/19/2022 10:28 AM   Modules accepted: Orders

## 2022-03-05 ENCOUNTER — Encounter: Payer: Self-pay | Admitting: Cardiovascular Disease

## 2022-03-05 NOTE — Progress Notes (Unsigned)
Cardiology Office Note:    Date:  03/06/2022   ID:  Emma Glenn, DOB 27-Nov-1965, MRN [id number]  PCP:  Emma Frees, MD   [facility] Providers Cardiologist:   Emma Glenn      Referring MD: Emma Frees, MD   Chief Complaint  Patient presents with   Hyperlipidemia    History of Present Illness:    Emma Glenn is a 57 y.o. female with a hx of  HTN,  HLD. We were asked to see her by Emma Glenn for further evaluation and management of her HTN  Emma Glenn had HTN several years ago .   Was ok for a while , now she is back on meds.  Has lots of health issues Is not able to exercise much ,  waks some  Does not have enough energy to exercise Takes care of her mother , causes lots of stress   Eats lots of cereal  Sandwich for lunch - [employer] , [employer], [employer]  [employer]  Bacon, sausage, bologna   Smokes 1 ppd - had quit for 3 years, restarted 2 years Wt is 147 lbs   Multiple family members ( father, brother, grandmother, all had CAD )   Is on Pravastatin for HLD - Emma Glenn put her on Prava because of her NASH LDL is 154 ( while off pravastatin )  Will get a coronary calcium score.  If her coronary calcium score is high then we will refer her to lipid clinic for consideration of PCSK9 inhibitors or other medicines.  We will want to start her on something that will not affect her nonalcoholic liver disease.  March 06, 2022:  Emma Glenn is seen today for follow-up visit.  She has a history of hyperlipidemia.  She has a history of cigarette smoking.  She has a history of nonalcoholic liver cirrhosis.  We ordered a coronary calcium score in an effort to assess her risk of having hyperlipidemia since she is limited in which statin she can safely take. Coronary calcium score from January, 2023 was 1.53 which places her in the 71st percentile for age and sex matched controls.  She was admitted with a CVA in January, 2023.  Has stopped smoking   Past Medical History:   Diagnosis Date   Acute stress reaction    Allergic rhinitis    Anxiety    Autoimmune thyroiditis    CIN I (cervical intraepithelial neoplasia I)    Cirrhosis of liver (HCC)    COPD (chronic obstructive pulmonary disease) (HCC)    Depression    DM (diabetes mellitus) (HCC)    GERD (gastroesophageal reflux disease)    Hemochromatosis    Hepatic steatosis    Hereditary hemochromatosis ([place])    Hyperlipidemia    Hypertension    Hypothyroidism    Labial cyst    Inclusion cyst   Liver cirrhosis (HCC)    Morbid obesity (HCC)    NASH (nonalcoholic steatohepatitis)    Positive ANA (antinuclear antibody)    Pure hypercholesterolemia    Seizures ([place])    last seizure 3/17? over dose wellbutrin   Thyroid disease    Graves dis-Radioactive Iodine   Tobacco dependence     Past Surgical History:  Procedure Laterality Date   CERVICAL BIOPSY  W/ LOOP ELECTRODE EXCISION     Excision labial inclusion cyst   CESAREAN SECTION     COLPOSCOPY     HAND SURGERY     SHOULDER ARTHROSCOPY WITH OPEN ROTATOR CUFF  REPAIR AND DISTAL CLAVICLE ACROMINECTOMY Left 06/13/2015   Procedure: LEFT SHOULDER ARTHROSCOPY, DEBRIDEMENT, OPEN ROTATOR CUFF REPAIR, CORACOID FRACTURE FIXATION, BICEPS TENODESIS;  Surgeon: Emma Pel, MD;  Location: [facility];  Service: Orthopedics;  Laterality: Left;   SHOULDER CLOSED REDUCTION Left 09/26/2015   Procedure: CLOSED MANIPULATION SHOULDER UNDER ANESTHESIA;  Surgeon: Emma Pel, MD;  Location: [facility];  Service: Orthopedics;  Laterality: Left;   TUBAL LIGATION      Current Medications: Current Meds  Medication Sig   aspirin EC 81 MG EC tablet Take 1 tablet (81 mg total) by mouth daily with breakfast. take Aspirin 81 mg daily along with Plavix 75 mg daily for 30 days then after that STOP the Plavix  and continue ONLY Aspirin 81 mg daily indefinitely--   BEVESPI AEROSPHERE 9-4.8 MCG/ACT AERO INHALE 2 PUFFS INTO THE LUNGS TWICE DAILY. REPLACES ANORO INHALER.    Dulaglutide (TRULICITY) 1.5 QI/2.9NL SOPN Inject 1.5 mg into the skin once a week.   empagliflozin (JARDIANCE) 25 MG TABS tablet Take 1 tablet (25 mg total) by mouth daily.   glucose blood (BAYER CONTOUR TEST) test strip Use 2x a day   levothyroxine (SYNTHROID) 150 MCG tablet Take 1 tablet (150 mcg total) by mouth daily before breakfast.   metFORMIN (GLUCOPHAGE-XR) 500 MG 24 hr tablet TAKE 2 TABLETS EVERY DAY   omeprazole (PRILOSEC) 40 MG capsule Take 1 capsule (40 mg total) by mouth daily.   SURE COMFORT PEN NEEDLES 32G X 4 MM MISC USE TO INJECT LANTUS ONCE DAILY   [DISCONTINUED] amLODipine (NORVASC) 10 MG tablet Take 1 tablet (10 mg total) by mouth daily.   [DISCONTINUED] atorvastatin (LIPITOR) 80 MG tablet Take 1 tablet (80 mg total) by mouth daily. Take in place of Pravastatin   [DISCONTINUED] losartan (COZAAR) 50 MG tablet Take 1 tablet (50 mg total) by mouth daily.     Allergies:   Doxycycline, Nitrofurantoin monohyd macro, Septra [sulfamethoxazole-trimethoprim], Bupropion, Clopidogrel, and Lamictal [lamotrigine]   Social History   Socioeconomic History   Marital status: Married    Spouse name: Emma Glenn   Number of children: 1   Years of education: 12   Highest education level: Not on file  Occupational History   Not on file  Tobacco Use   Smoking status: Former    Packs/day: 1.00    Types: Cigarettes    Quit date: 03/23/2021    Years since quitting: 0.9   Smokeless tobacco: Never  Vaping Use   Vaping Use: Never used  Substance and Sexual Activity   Alcohol use: Yes    Alcohol/week: 0.0 standard drinks of alcohol    Comment: rare   Drug use: Yes    Types: Marijuana    Comment: Hx of marijuana use   Sexual activity: Yes    Birth control/protection: Surgical  Other Topics Concern   Not on file  Social History Narrative   Lives with husband and sister   Caffeine use: Tea/coffee daily   Social Determinants of Health   Financial Resource Strain: Not on file  Food  Insecurity: Not on file  Transportation Needs: Not on file  Physical Activity: Not on file  Stress: Not on file  Social Connections: Not on file     Family History: The patient's family history includes Breast cancer in her maternal grandmother; Diabetes in her father; Heart disease in her brother, father, and mother; Hypertension in her mother; Uterine cancer in her maternal aunt. There is no history of Seizures, Stomach cancer,  or Colon cancer.  ROS:   Please see the history of present illness.     All other systems reviewed and are negative.  EKGs/Labs/Other Studies Reviewed:    The following studies were reviewed today:   EKG: March 06, 2022: Normal sinus rhythm at 76.Marland Kitchen  Normal EKG.  Recent Labs: 03/09/2021: Hemoglobin 18.1; Platelets 215 10/17/2021: BUN 9; Creat 0.68; Potassium 4.1; Sodium 141 12/21/2021: ALT 50 02/18/2022: TSH 7.47  Recent Lipid Panel    Component Value Date/Time   CHOL 227 (H) 10/17/2021 1106   CHOL 116 04/16/2021 1115   TRIG 177.0 (H) 10/17/2021 1106   HDL 38.60 (L) 10/17/2021 1106   HDL 28 (L) 04/16/2021 1115   CHOLHDL 6 10/17/2021 1106   VLDL 35.4 10/17/2021 1106   LDLCALC 153 (H) 10/17/2021 1106   LDLCALC 59 04/16/2021 1115   LDLCALC 131 (H) 12/13/2016 1654     Risk Assessment/Calculations:           Physical Exam:    Physical Exam: Blood pressure 124/84, pulse 76, height '5\' 2"'$  (1.575 m), weight 162 lb 12.8 oz (73.8 kg), last menstrual period 04/25/2016, SpO2 97 %.       GEN:  Well nourished, well developed in no acute distress HEENT: Normal NECK: No JVD; No carotid bruits LYMPHATICS: No lymphadenopathy CARDIAC: RRR , no murmurs, rubs, gallops RESPIRATORY:  Clear to auscultation without rales, wheezing or rhonchi  ABDOMEN: Soft, non-tender, non-distended MUSCULOSKELETAL:  No edema; No deformity  SKIN: Warm and dry NEUROLOGIC:  Alert and oriented x 3      ASSESSMENT:    1. Mixed hyperlipidemia   2. Elevated coronary artery  calcium score     PLAN:       1.  Hypertension: Blood pressure is now well-controlled.  Continue current medications.  2.  Hyperlipidemia: Last lipids had an LDL of 150s.  She is now on atorvastatin 80 mg a day.  Will continue current medications.  Check lipids, ALT, basic metabolic profile today.             Medication Adjustments/Labs and Tests Ordered: Current medicines are reviewed at length with the patient today.  Concerns regarding medicines are outlined above.  Orders Placed This Encounter  Procedures   Lipid panel   ALT   Basic metabolic panel   EKG [id number]   Meds ordered this encounter  Medications   amLODipine (NORVASC) 10 MG tablet    Sig: Take 1 tablet (10 mg total) by mouth daily.    Dispense:  90 tablet    Refill:  3   atorvastatin (LIPITOR) 80 MG tablet    Sig: Take 1 tablet (80 mg total) by mouth daily. Take in place of Pravastatin    Dispense:  90 tablet    Refill:  3   losartan (COZAAR) 50 MG tablet    Sig: Take 1 tablet (50 mg total) by mouth daily.    Dispense:  90 tablet    Refill:  3     Patient Instructions  Medication Instructions:  REFILLED Amlodipine, Losartan, and Atorvastatin *If you need a refill on your cardiac medications before your next appointment, please call your pharmacy*   Lab Work: Lipids, ALT, BMET today If you have labs (blood work) drawn today and your tests are completely normal, you will receive your results only by: [place] (if you have MyChart) OR A paper copy in the mail If you have any lab test that is abnormal or we need to change  your treatment, we will call you to review the results.   Testing/Procedures: NONE   Follow-Up: At [facility], you and your health needs are our priority.  As part of our continuing mission to provide you with exceptional heart care, we have created designated Provider Care Teams.  These Care Teams include your primary Cardiologist (physician) and Advanced  Practice Providers (APPs -  Physician Assistants and Nurse Practitioners) who all work together to provide you with the care you need, when you need it.  We recommend signing up for the patient portal called "MyChart".  Sign up information is provided on this After Visit Summary.  MyChart is used to connect with patients for Virtual Visits (Telemedicine).  Patients are able to view lab/test results, encounter notes, upcoming appointments, etc.  Non-urgent messages can be sent to your provider as well.   To learn more about what you can do with MyChart, go to [email or web address].    Your next appointment:   1 year(s)  Provider:   Mertie Moores, MD      Signed, Mertie Moores, MD  03/06/2022 5:53 PM    [place]

## 2022-03-06 ENCOUNTER — Ambulatory Visit: Payer: Medicare HMO | Attending: Cardiovascular Disease | Admitting: Cardiovascular Disease

## 2022-03-06 ENCOUNTER — Encounter: Payer: Self-pay | Admitting: Cardiovascular Disease

## 2022-03-06 VITALS — BP 124/84 | HR 76 | Ht 62.0 in | Wt 162.8 lb

## 2022-03-06 DIAGNOSIS — R931 Abnormal findings on diagnostic imaging of heart and coronary circulation: Secondary | ICD-10-CM

## 2022-03-06 DIAGNOSIS — E782 Mixed hyperlipidemia: Secondary | ICD-10-CM

## 2022-03-06 MED ORDER — AMLODIPINE BESYLATE 10 MG PO TABS: 10.0000 mg | ORAL_TABLET | Freq: Every day | ORAL | 3 refills | Status: AC

## 2022-03-06 MED ORDER — ATORVASTATIN CALCIUM 80 MG PO TABS: 80.0000 mg | ORAL_TABLET | Freq: Every day | ORAL | 3 refills | Status: AC

## 2022-03-06 MED ORDER — LOSARTAN POTASSIUM 50 MG PO TABS: 50.0000 mg | ORAL_TABLET | Freq: Every day | ORAL | 3 refills | Status: DC

## 2022-03-06 NOTE — Patient Instructions (Signed)
Medication Instructions:  REFILLED Amlodipine, Losartan, and Atorvastatin *If you need a refill on your cardiac medications before your next appointment, please call your pharmacy*   Lab Work: Lipids, ALT, BMET today If you have labs (blood work) drawn today and your tests are completely normal, you will receive your results only by: [place] (if you have MyChart) OR A paper copy in the mail If you have any lab test that is abnormal or we need to change your treatment, we will call you to review the results.   Testing/Procedures: NONE   Follow-Up: At [facility], you and your health needs are our priority.  As part of our continuing mission to provide you with exceptional heart care, we have created designated Provider Care Teams.  These Care Teams include your primary Cardiologist (physician) and Advanced Practice Providers (APPs -  Physician Assistants and Nurse Practitioners) who all work together to provide you with the care you need, when you need it.  We recommend signing up for the patient portal called "MyChart".  Sign up information is provided on this After Visit Summary.  MyChart is used to connect with patients for Virtual Visits (Telemedicine).  Patients are able to view lab/test results, encounter notes, upcoming appointments, etc.  Non-urgent messages can be sent to your provider as well.   To learn more about what you can do with MyChart, go to [email or web address].    Your next appointment:   1 year(s)  Provider:   Mertie Moores, MD

## 2022-03-07 LAB — BASIC METABOLIC PANEL
BUN/Creatinine Ratio: 14 (ref 9–23)
BUN: 11 mg/dL (ref 6–24)
CO2: 21 mmol/L (ref 20–29)
Calcium: 9.7 mg/dL (ref 8.7–10.2)
Chloride: 101 mmol/L (ref 96–106)
Creatinine, Ser: 0.76 mg/dL (ref 0.57–1.00)
Glucose: 139 mg/dL — ABNORMAL HIGH (ref 70–99)
Potassium: 4.3 mmol/L (ref 3.5–5.2)
Sodium: 140 mmol/L (ref 134–144)
eGFR: 91 mL/min/{1.73_m2} (ref >59)

## 2022-03-07 LAB — LIPID PANEL
Chol/HDL Ratio: 3.6 ratio (ref 0.0–4.4)
Cholesterol, Total: 97 mg/dL — ABNORMAL LOW (ref 100–199)
HDL: 27 mg/dL — ABNORMAL LOW (ref >39)
LDL Chol Calc (NIH): 45 mg/dL (ref 0–99)
Triglycerides: 145 mg/dL (ref 0–149)
VLDL Cholesterol Cal: 25 mg/dL (ref 5–40)

## 2022-03-07 LAB — ALT: ALT: 63 IU/L — ABNORMAL HIGH (ref 0–32)

## 2022-04-01 DIAGNOSIS — E7800 Pure hypercholesterolemia, unspecified: Secondary | ICD-10-CM | POA: Diagnosis not present

## 2022-04-01 DIAGNOSIS — E038 Other specified hypothyroidism: Secondary | ICD-10-CM | POA: Diagnosis not present

## 2022-04-01 DIAGNOSIS — E1165 Type 2 diabetes mellitus with hyperglycemia: Secondary | ICD-10-CM | POA: Diagnosis not present

## 2022-04-01 DIAGNOSIS — K7460 Unspecified cirrhosis of liver: Secondary | ICD-10-CM | POA: Diagnosis not present

## 2022-04-01 DIAGNOSIS — J449 Chronic obstructive pulmonary disease, unspecified: Secondary | ICD-10-CM | POA: Diagnosis not present

## 2022-04-01 DIAGNOSIS — K219 Gastro-esophageal reflux disease without esophagitis: Secondary | ICD-10-CM | POA: Diagnosis not present

## 2022-04-01 DIAGNOSIS — I10 Essential (primary) hypertension: Secondary | ICD-10-CM | POA: Diagnosis not present

## 2022-04-01 DIAGNOSIS — K7469 Other cirrhosis of liver: Secondary | ICD-10-CM | POA: Diagnosis not present

## 2022-04-08 DIAGNOSIS — H524 Presbyopia: Secondary | ICD-10-CM | POA: Diagnosis not present

## 2022-04-20 ENCOUNTER — Other Ambulatory Visit: Payer: Self-pay | Admitting: Internal Medicine

## 2022-06-13 ENCOUNTER — Encounter: Payer: Self-pay | Admitting: Nurse Practitioner

## 2022-06-13 ENCOUNTER — Other Ambulatory Visit: Payer: Self-pay | Admitting: Nurse Practitioner

## 2022-06-13 DIAGNOSIS — K7460 Unspecified cirrhosis of liver: Secondary | ICD-10-CM | POA: Diagnosis not present

## 2022-06-13 DIAGNOSIS — R945 Abnormal results of liver function studies: Secondary | ICD-10-CM | POA: Diagnosis not present

## 2022-06-13 DIAGNOSIS — D376 Neoplasm of uncertain behavior of liver, gallbladder and bile ducts: Secondary | ICD-10-CM | POA: Diagnosis not present

## 2022-06-13 DIAGNOSIS — R799 Abnormal finding of blood chemistry, unspecified: Secondary | ICD-10-CM | POA: Diagnosis not present

## 2022-06-13 DIAGNOSIS — R791 Abnormal coagulation profile: Secondary | ICD-10-CM | POA: Diagnosis not present

## 2022-06-13 DIAGNOSIS — E875 Hyperkalemia: Secondary | ICD-10-CM | POA: Diagnosis not present

## 2022-06-13 DIAGNOSIS — R772 Abnormality of alphafetoprotein: Secondary | ICD-10-CM | POA: Diagnosis not present

## 2022-06-13 DIAGNOSIS — R140 Abdominal distension (gaseous): Secondary | ICD-10-CM | POA: Diagnosis not present

## 2022-06-13 DIAGNOSIS — K7581 Nonalcoholic steatohepatitis (NASH): Secondary | ICD-10-CM | POA: Diagnosis not present

## 2022-06-13 DIAGNOSIS — R7989 Other specified abnormal findings of blood chemistry: Secondary | ICD-10-CM | POA: Diagnosis not present

## 2022-06-19 ENCOUNTER — Ambulatory Visit (INDEPENDENT_AMBULATORY_CARE_PROVIDER_SITE_OTHER): Payer: Medicare HMO | Admitting: Internal Medicine

## 2022-06-19 ENCOUNTER — Encounter: Payer: Self-pay | Admitting: Internal Medicine

## 2022-06-19 VITALS — BP 128/80 | HR 82 | Ht 62.0 in | Wt 161.8 lb

## 2022-06-19 DIAGNOSIS — E785 Hyperlipidemia, unspecified: Secondary | ICD-10-CM | POA: Diagnosis not present

## 2022-06-19 DIAGNOSIS — E039 Hypothyroidism, unspecified: Secondary | ICD-10-CM | POA: Diagnosis not present

## 2022-06-19 DIAGNOSIS — E118 Type 2 diabetes mellitus with unspecified complications: Secondary | ICD-10-CM | POA: Diagnosis not present

## 2022-06-19 DIAGNOSIS — J449 Chronic obstructive pulmonary disease, unspecified: Secondary | ICD-10-CM | POA: Diagnosis not present

## 2022-06-19 DIAGNOSIS — E1165 Type 2 diabetes mellitus with hyperglycemia: Secondary | ICD-10-CM | POA: Diagnosis not present

## 2022-06-19 DIAGNOSIS — Z7985 Long-term (current) use of injectable non-insulin antidiabetic drugs: Secondary | ICD-10-CM

## 2022-06-19 DIAGNOSIS — E119 Type 2 diabetes mellitus without complications: Secondary | ICD-10-CM

## 2022-06-19 DIAGNOSIS — Z7984 Long term (current) use of oral hypoglycemic drugs: Secondary | ICD-10-CM | POA: Diagnosis not present

## 2022-06-19 LAB — TSH: TSH: 6.9 u[IU]/mL — ABNORMAL HIGH (ref 0.35–5.50)

## 2022-06-19 LAB — T4, FREE: Free T4: 1.26 ng/dL (ref 0.60–1.60)

## 2022-06-19 LAB — POCT GLYCOSYLATED HEMOGLOBIN (HGB A1C): Hemoglobin A1C: 8.4 % — AB (ref 4.0–5.6)

## 2022-06-19 MED ORDER — SEMAGLUTIDE(0.25 OR 0.5MG/DOS) 2 MG/3ML ~~LOC~~ SOPN: PEN_INJECTOR | SUBCUTANEOUS | 3 refills | Status: DC

## 2022-06-19 NOTE — Addendum Note (Signed)
Addended by: Carlus Pavlov on: 06/19/2022 12:59 PM   Modules accepted: Orders

## 2022-06-19 NOTE — Progress Notes (Addendum)
Patient ID: Emma Glenn, female   DOB: Jun 29, 1965, 57 y.o.   MRN: [id number]   HPI: Emma Glenn is a 57 y.o.-year-old female, initially referred by her GI Dr, Dr. Rhea Belton, returning for follow-up for uncontrolled DM2, currently insulin independent, with complications (lacunar CVA 02/2021) and hypothyroidism. Last visit 4 months ago.  1 insurance: [employer] 2nd insurance: [employer]  Interim history: No increased urination, blurry vision, chest pain.  She does have shortness of breath and occasional nausea. Since last visit, she was not able to refill her Trulicity due to [employer].  She also relaxed her diet and is eating cookies and night and drinking regular Pepsi.  She is not checking her blood sugars and only has 5 checks in the last few months.  DM2: Reviewed HbA1c levels: Lab Results  Component Value Date   HGBA1C 8.1 (A) 02/18/2022   HGBA1C 7.1 (A) 10/17/2021   HGBA1C 5.7 (H) 03/09/2021  Labs from [place], drawn on 07/29/2016: HbA1c still very high, at 12.6%  Pt is on a regimen of: - Metformin ER 1000 mg with breakfast >> at night - Jardiance 10 >> 25 mg before breakfast  - Trulicity 1.5 mg weekly - through patient assistance program >> ran out 11/2019 >> restarted 02/2022 Previously also on: -  >> stopped 06/2019  She checks her sugars - occasionally: - am: 110-129, 139 >> 127, 149 >> 127, 171 >> 101-200 >> 174-212 - 2h after b'fast:  125, 136 >> n/c >> 114 >> n/c - before lunch:  115 >> 91 >> 77-112 >> n/c - 2h after lunch: 166, 169 >> 228 >> 89 >> n/c >> 118 >> n/c - before dinner:  n/c >> 71, 87 >> 86, 104 >> 83 >> 132 >> n/c - 2h after dinner: n/c >> 162 >> 131 >> 89 >> n/c - bedtime:  <150 >> 123 >> n/c >> 98 >> n/c - nighttime: n/c Lowest sugar was  71 >> 77 >> 83 >> 83 >> 101 >> 174; it is unclear at which level she has hypoglycemia awareness: Highest sugar was 169 >> 228 >> 149 x1 >> 171 >> 200 >> 212.  [name] meals are: - Breakfast: Cereals or 2 eggs  and toast - Lunch: Skips - Dinner: Meat and veggies - Snacks: Fruit, cookies Stopped milkshakes. She restudied and drink regular sodas and eats cookies at night.  Meter: Bayer Contour  -No CKD, last BUN/creatinine:  Lab Results  Component Value Date   BUN 11 03/06/2022   BUN 9 10/17/2021   CREATININE 0.76 03/06/2022   CREATININE 0.68 10/17/2021    -+ HL; last set of lipids: Lab Results  Component Value Date   CHOL 97 (L) 03/06/2022   HDL 27 (L) 03/06/2022   LDLCALC 45 03/06/2022   TRIG 145 03/06/2022   CHOLHDL 3.6 03/06/2022   We restarted pravastatin 12/2016, after we consulted with her gastroenterologist, Dr. Rhea Belton.  LFTs remained stable after starting the statin but started to increase afterwards, being higher at last check in 08/2017.  Dr. Rhea Belton is aware of the above and considers these to be related to her fatty liver disease.  We continued her pravastatin >> 80 mg Lipitor daily- started 02/2021. Of note, she is also seen Dr. Vanessa Barbara (hepatologist) and she has liver ultrasound every 6 months.  Latest LFTs were normal at last check: Lab Results  Component Value Date   ALT 63 (H) 03/06/2022   AST 56 (H) 12/21/2021   ALKPHOS  70 12/21/2021   BILITOT 0.5 12/21/2021   ACR is fluctuating: Lab Results  Component Value Date   MICRALBCREAT 37.3 (H) 10/17/2021   MICRALBCREAT 44.9 (H) 04/19/2020   MICRALBCREAT 20.2 07/17/2018   MICRALBCREAT 237.7 (H) 04/02/2018   MICRALBCREAT 16.1 02/18/2017  07/29/2016: UACR was high, 148.9 Off Cozaar after she ran out.  - last eye exam: 02/12/2022: No DR reportedly.  -No numbness and tingling in her feet.  Last foot exam 10/17/2021.  She had an acute CVA on 03/09/2021:  small acute infarct in the posterior left basal ganglia >>  R arm and leg paresis >> resolved.  She stopped smoking after her stroke.  Hypothyroidism:  Pt is on levothyroxine 150 mcg daily (increased 02/2022), taken: - in am - fasting - at least 30 min from  b'fast - no calcium - no iron - no multivitamins - + PPIs at bedtime - occas. - not on Biotin  Reviewed her TFTs: Lab Results  Component Value Date   TSH 7.47 (H) 02/18/2022   TSH 6.62 (H) 12/21/2021   TSH 9.18 (H) 10/17/2021   TSH 2.552 03/09/2021   TSH 1.78 04/19/2020   TSH 0.33 (L) 03/10/2019   TSH 0.10 (L) 11/04/2018   TSH 0.12 (L) 07/17/2018   TSH 4.77 (H) 04/02/2018   TSH 2.65 05/21/2017   TSH 17.64 (H) 02/18/2017   TSH 4.45 06/04/2016   TSH 2.338 04/28/2015  07/29/2016: TSH 6.18.  Lab Results  Component Value Date   FREET4 1.08 02/18/2022   FREET4 1.34 12/21/2021   FREET4 1.20 10/17/2021   FREET4 1.41 04/19/2020   FREET4 1.21 03/10/2019   FREET4 1.68 (H) 11/04/2018   FREET4 1.60 07/17/2018   FREET4 1.36 05/21/2017   FREET4 0.96 02/18/2017   Reviewed previous history: She started to have severe generalized mm cramps in 03/2016.  Around that time >> RUQ pain >> HIDA scan >> GB w/o clear stones, but with sludge. She had an abd. CT scan: increased size of her liver + liver steatosis.  She was also found to have a 24-hour urine free cortisol, which returned slightly elevated: Component     Latest Ref Rng & Units 06/04/2016 06/14/2016  Cortisol (Ur), Free     4.0 - 50.0 mcg/24 h  57.0 (H)  Results received     0.63 - 2.50 g/24 h  1.27  Cortisol, Plasma     Ug/dL (collected at 5 pm) 6.8    A CT abdomen (05/2016) showed normal adrenals.  A brain MRI (04/2015) showed normal pituitary gland.  A dexamethasone suppression test was normal >> no signs of Cushing's syndrome:  Component     Latest Ref Rng & Units 07/02/2016  Cortisol - AM     mcg/dL 1.8 (L)  Dexamethasone, Serum     ng/dL 161   She had a liver Bx  >> has level 3 scarring. 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  ROS: + See HPI  I reviewed pt's medications, allergies, PMH, social hx, family hx, and changes were documented in the history of present illness.  Otherwise, unchanged from my initial visit note.  Past Medical History:  Diagnosis Date   Acute stress reaction    Allergic rhinitis    Anxiety    Autoimmune thyroiditis    CIN I (cervical intraepithelial neoplasia I)    Cirrhosis of liver (HCC)    COPD (chronic obstructive pulmonary disease) (HCC)    Depression  DM (diabetes mellitus) (HCC)    GERD (gastroesophageal reflux disease)    Hemochromatosis    Hepatic steatosis    Hereditary hemochromatosis (HCC)    Hyperlipidemia    Hypertension    Hypothyroidism    Labial cyst    Inclusion cyst   Liver cirrhosis (HCC)    Morbid obesity (HCC)    NASH (nonalcoholic steatohepatitis)    Positive ANA (antinuclear antibody)    Pure hypercholesterolemia    Seizures (HCC)    last seizure 3/17? over dose wellbutrin   Thyroid disease    Graves dis-Radioactive Iodine   Tobacco dependence    Past Surgical History:  Procedure Laterality Date   CERVICAL BIOPSY  W/ LOOP ELECTRODE EXCISION     Excision labial inclusion cyst   CESAREAN SECTION     COLPOSCOPY     HAND SURGERY     SHOULDER ARTHROSCOPY WITH OPEN ROTATOR CUFF REPAIR AND DISTAL CLAVICLE ACROMINECTOMY Left 06/13/2015   Procedure: LEFT SHOULDER ARTHROSCOPY, DEBRIDEMENT, OPEN ROTATOR CUFF REPAIR, CORACOID FRACTURE FIXATION, BICEPS TENODESIS;  Surgeon: Cammy Copa, MD;  Location: [facility] OR;  Service: Orthopedics;  Laterality: Left;   SHOULDER CLOSED REDUCTION Left 09/26/2015   Procedure: CLOSED MANIPULATION SHOULDER UNDER ANESTHESIA;  Surgeon: Cammy Copa, MD;  Location: [facility] OR;  Service: Orthopedics;  Laterality: Left;   TUBAL LIGATION     Social History   Social History   Marital status: Married    Spouse name: Brynda Greathouse   Number of children: 1   Years of education: 12   Occupational History   Administrator, [employer]    Social History Main Topics   Smoking status: Former Smoker    Packs/day: 1.00    Types: Cigarettes    Quit date: 02/14/2015   Smokeless tobacco: Never  Used   Alcohol use 0.0 oz/week     Comment: rare   Drug use: No     Comment: Hx of marijuana use   Sexual activity: Yes    Birth control/ protection: Surgical   Social History Narrative   Lives with husband and sister   Caffeine use: Tea/coffee daily      Current Outpatient Medications on File Prior to Visit  Medication Sig Dispense Refill   amLODipine (NORVASC) 10 MG tablet Take 1 tablet (10 mg total) by mouth daily. 90 tablet 3   aspirin EC 81 MG EC tablet Take 1 tablet (81 mg total) by mouth daily with breakfast. take Aspirin 81 mg daily along with Plavix 75 mg daily for 30 days then after that STOP the Plavix  and continue ONLY Aspirin 81 mg daily indefinitely-- 30 tablet 11   atorvastatin (LIPITOR) 80 MG tablet Take 1 tablet (80 mg total) by mouth daily. Take in place of Pravastatin 90 tablet 3   BEVESPI AEROSPHERE 9-4.8 MCG/ACT AERO INHALE 2 PUFFS INTO THE LUNGS TWICE DAILY. REPLACES ANORO INHALER. 10.7 g 6   Dulaglutide (TRULICITY) 1.5 MG/0.5ML SOPN Inject 1.5 mg into the skin once a week. 6 mL 3   empagliflozin (JARDIANCE) 25 MG TABS tablet Take 1 tablet (25 mg total) by mouth daily. 90 tablet 3   glucose blood (BAYER CONTOUR TEST) test strip Use 2x a day 200 each 3   levothyroxine (SYNTHROID) 150 MCG tablet Take 1 tablet (150 mcg total) by mouth daily before breakfast. 45 tablet 3   losartan (COZAAR) 50 MG tablet Take 1 tablet (50 mg total) by mouth daily. 90 tablet 3   metFORMIN (GLUCOPHAGE-XR) 500 MG 24 hr  tablet TAKE 2 TABLETS EVERY DAY 180 tablet 3   omeprazole (PRILOSEC) 40 MG capsule Take 1 capsule (40 mg total) by mouth daily. 30 capsule 6   SURE COMFORT PEN NEEDLES 32G X 4 MM MISC USE TO INJECT LANTUS ONCE DAILY 100 each 3   No current facility-administered medications on file prior to visit.   Allergies  Allergen Reactions   Doxycycline Other (See Comments)    Flu like symptoms (high fever, chills)   Nitrofurantoin Monohyd Macro Other (See Comments)    Flu like  symptoms (high fever, chills)   Septra [Sulfamethoxazole-Trimethoprim] Other (See Comments)    Flu like symptoms (high fever, chills)   Bupropion     Other Reaction(s): seizure   Clopidogrel     Other Reaction(s): hives   Lamictal [Lamotrigine] Rash   Family History  Problem Relation Age of Onset   Hypertension Mother    Heart disease Mother    Diabetes Father    Heart disease Father    Heart disease Brother    Breast cancer Maternal Grandmother    Uterine cancer Maternal Aunt    Seizures Neg Hx    Stomach cancer Neg Hx    Colon cancer Neg Hx    PE: BP 128/80 (BP Location: Right Arm, Patient Position: Sitting, Cuff Size: Normal)   Pulse 82   Ht 5\' 2"  (1.575 m)   Wt 161 lb 12.8 oz (73.4 kg)   LMP 04/25/2016   SpO2 99%   BMI 29.59 kg/m  Wt Readings from Last 3 Encounters:  06/19/22 161 lb 12.8 oz (73.4 kg)  03/06/22 162 lb 12.8 oz (73.8 kg)  02/18/22 163 lb 9.6 oz (74.2 kg)   Constitutional: normal weight, in NAD Eyes: EOMI, no exophthalmos ENT: no thyromegaly, no cervical lymphadenopathy Cardiovascular: RRR, No MRG Respiratory: CTA B Musculoskeletal: no deformities Skin: no rashes Neurological: no tremor with outstretched hands  ASSESSMENT: 1. DM2, non-insulin-dependent, with complications -acute CVA -03/09/2021  2. Hypothyroidism  -Post ablative, after RAI treatment for Graves' disease  3. HL  PLAN: 1. DM2 -Patient with uncontrolled type 2 diabetes, on an oral antidiabetic regimen with metformin and SGLT2 inhibitor and also GLP-1 receptor agonist.  At last visit, HbA1c was higher, at 8.1% and I suggested to start back on Trulicity.  At that time, she was not checking sugars consistently but whenever checked, sugars were above target.  She still has dietary indiscretions but she was planning to improve them after the holidays. -Since last visit, she was not able to obtain Trulicity due to [employer].  She did not let me know, so she is currently only  taking metformin and Jardiance and sugars are quite high.  She is not taking consistently but whenever checked, sugars are above target.  She also relaxed her diet since last visit, reintroducing regular sodas and eating cookies at night.  We discussed about the need to improve the diet and definitely eliminate regular sodas.  Will also try to add Ozempic, which has better availability currently but I advised her to let me know if she cannot obtain this from the pharmacy.  In that case, we can start Mounjaro (I did not recommend this today because this is also on [employer]).  Will continue metformin and Jardiance for now. - I suggested to:  Patient Instructions  Please continue: - Metformin ER 1000 mg at night - Jardiance 25 mg before breakfast   Please start Ozempic 0.25 mg weekly in a.m. (for example on  Sunday morning) x 4 weeks, then increase to 0.5 mg weekly in a.m. if no nausea or hypoglycemia.  Please continue levothyroxine 150 mcg daily  Take the thyroid hormone every day, with water, at least 30 minutes before breakfast, separated by at least 4 hours from: - acid reflux medications - calcium - iron - multivitamins  Please stop at the lab.  Please return in 4 months with your sugar log.   - we checked her HbA1c: 8.4% (higher) - advised to check sugars at different times of the day - 1x a day, rotating check times - advised for yearly eye exams >> she is UTD - return to clinic in 4 months      2. Hypothyroidism -Developed after RAI ablation - latest thyroid labs reviewed with pt. >> normal: Lab Results  Component Value Date   TSH 7.47 (H) 02/18/2022  - she continues on LT4 150 mcg daily, increased after the above result returned - pt feels good on this dose. - we discussed about taking the thyroid hormone every day, with water, >30 minutes before breakfast, separated by >4 hours from acid reflux medications, calcium, iron, multivitamins. Pt. is taking it correctly. -  will check thyroid tests today: TSH and fT4 - If labs are abnormal, she will need to return for repeat TFTs in 1.5 months  3. HL  -Reviewed lipid panel from 02/2022: Fractions at goal with the exception of a low HDL: Lab Results  Component Value Date   CHOL 97 (L) 03/06/2022   HDL 27 (L) 03/06/2022   LDLCALC 45 03/06/2022   TRIG 145 03/06/2022   CHOLHDL 3.6 03/06/2022  -She takes atorvastatin 80 mg daily without side effects  Component     Latest Ref Rng 06/19/2022  Hemoglobin A1C     4.0 - 5.6 % 8.4 !   TSH     0.35 - 5.50 uIU/mL 6.90 (H)   T4,Free(Direct)     0.60 - 1.60 ng/dL 1.61   TSH level is slightly better.  I would be reticent to increase the levothyroxine for now we will plan to recheck the test in 1.5 months and we will need to do it then.  I will encourage her to not miss any dose.  Carlus Pavlov, MD PhD [facility]

## 2022-06-19 NOTE — Patient Instructions (Addendum)
Please continue: - Metformin ER 1000 mg at night - Jardiance 25 mg before breakfast   Please start Ozempic 0.25 mg weekly in a.m. (for example on Sunday morning) x 4 weeks, then increase to 0.5 mg weekly in a.m. if no nausea or hypoglycemia.  Please continue levothyroxine 150 mcg daily  Take the thyroid hormone every day, with water, at least 30 minutes before breakfast, separated by at least 4 hours from: - acid reflux medications - calcium - iron - multivitamins  Please stop at the lab.  Please return in 4 months with your sugar log.

## 2022-07-03 ENCOUNTER — Encounter: Payer: Self-pay | Admitting: Internal Medicine

## 2022-07-05 MED ORDER — ONDANSETRON HCL 4 MG PO TABS: 4.0000 mg | ORAL_TABLET | Freq: Three times a day (TID) | ORAL | 0 refills | Status: DC | PRN

## 2022-07-19 ENCOUNTER — Ambulatory Visit
Admission: RE | Admit: 2022-07-19 | Discharge: 2022-07-19 | Disposition: A | Discharge status: Home / Self Care | Payer: Medicare HMO | Source: Ambulatory Visit | Attending: Nurse Practitioner | Admitting: Nurse Practitioner

## 2022-07-19 DIAGNOSIS — K769 Liver disease, unspecified: Secondary | ICD-10-CM | POA: Diagnosis not present

## 2022-07-19 DIAGNOSIS — K7460 Unspecified cirrhosis of liver: Secondary | ICD-10-CM

## 2022-07-22 ENCOUNTER — Ambulatory Visit (INDEPENDENT_AMBULATORY_CARE_PROVIDER_SITE_OTHER): Payer: Medicare HMO | Admitting: Surgical

## 2022-07-22 ENCOUNTER — Other Ambulatory Visit (INDEPENDENT_AMBULATORY_CARE_PROVIDER_SITE_OTHER): Payer: Medicare HMO

## 2022-07-22 ENCOUNTER — Other Ambulatory Visit: Payer: Self-pay

## 2022-07-22 ENCOUNTER — Encounter: Payer: Self-pay | Admitting: Surgical

## 2022-07-22 VITALS — Ht 62.0 in | Wt 156.0 lb

## 2022-07-22 DIAGNOSIS — M7061 Trochanteric bursitis, right hip: Secondary | ICD-10-CM | POA: Diagnosis not present

## 2022-07-22 DIAGNOSIS — M545 Low back pain, unspecified: Secondary | ICD-10-CM

## 2022-07-24 ENCOUNTER — Other Ambulatory Visit: Payer: Self-pay | Admitting: Internal Medicine

## 2022-07-26 ENCOUNTER — Encounter: Payer: Self-pay | Admitting: Surgical

## 2022-07-26 NOTE — Progress Notes (Signed)
Office Visit Note   Patient: Emma Glenn           Date of Birth: 12/11/1965           MRN: [id number] Visit Date: 07/22/2022 Requested by: Johny Blamer, MD [phone] [address] [place],  [place] [address] PCP: Johny Blamer, MD  Subjective: Chief Complaint  Patient presents with   Lower Back - Pain   Right Hip - Pain    HPI: Emma Glenn is a 57 y.o. female who presents to the office reporting right lateral hip pain.  Patient has history of trochanteric bursitis with injection previously that has given her good relief.  She has had increased pain in the right hip over the last several months.  Additionally, 2 weeks ago she bent down to pick up a carton of drinks and as she was twisting and getting up, she felt pain in her low back.  This pain will radiate around to the right side of the low back and into the right buttock.  Right hip pain has been worse since this as well.  She feels a pressure sensation into the hips.  No radiation past the hip.  Overall this pain that is new for her as gotten 50 to 60% better in the last 2 weeks compared with when it initially came on.  No groin pain.  Does have occasional numbness and tingling in her left buttock.  No burning sensation.  No history of back or hip surgery.  She had no red flag signs or symptoms such as bowel/bladder incontinence or saddle anesthesia..                ROS: All systems reviewed are negative as they relate to the chief complaint within the history of present illness.  Patient denies fevers or chills.  Assessment & Plan: Visit Diagnoses:  1. Acute bilateral low back pain, unspecified whether sciatica present   2. Trochanteric bursitis, right hip     Plan: Patient is a 57 year old female who presents for evaluation of right hip pain.  She has history of right lateral hip pain that she has tried stretching and therapy exercises for but she has had continued increased pain in the last several months.  Prior  trochanteric injections have given her good relief and she would like to repeat this today.  This was administered under ultrasound guidance and patient tolerated procedure well.  Will do these at most twice per year.  She also has increase in her low back pain since bending down to pick up a carton of drinks about 2 weeks ago.  This is 50 to 60% improved compared with the initial onset of pain.  Does have radiation of pain from the right low back into the right buttock which may be contributing to some of her worsening right hip pain as well.  With significant improvement over the last couple weeks, plan to hold off on any intervention for now but if she has continued symptoms, she will let us know in the next step would be to get MRI of the lumbar spine for further evaluation of lumbar radiculopathy into the buttock.  Follow-Up Instructions: No follow-ups on file.   Orders:  Orders Placed This Encounter  Procedures   XR Lumbar Spine 2-3 Views   US Guided Needle Placement - No Linked Charges   No orders of the defined types were placed in this encounter.     Procedures: No procedures performed  Clinical Data: No additional findings.  Objective: Vital Signs: Ht 5\' 2"  (1.575 m)   Wt 156 lb (70.8 kg)   LMP 04/25/2016   BMI 28.53 kg/m   Physical Exam:  Constitutional: Patient appears well-developed HEENT:  Head: Normocephalic Eyes:EOM are normal Neck: Normal range of motion Cardiovascular: Normal rate Pulmonary/chest: Effort normal Neurologic: Patient is alert Skin: Skin is warm Psychiatric: Patient has normal mood and affect  Ortho Exam: Ortho exam demonstrates right hip with no pain with hip range of motion.  Intact hip flexion, quad set, hamstring, dorsiflexion, plantarflexion, EHL rated 5/5.  No clonus bilaterally.  Moderate tenderness over the greater trochanter.  No Trendelenburg gait noted.  No cellulitis or skin changes in the area of the lateral hip.  Specialty  Comments:  No specialty comments available.  Imaging: No results found.   PMFS History: Patient Active Problem List   Diagnosis Date Noted   Acute CVA (cerebrovascular accident) (HCC) 03/09/2021   Elevated coronary artery calcium score 03/08/2021   Hyperlipidemia 08/27/2017   Elevated urinary free cortisol level 07/01/2016   Type 2 diabetes mellitus with hyperglycemia, without long-term current use of insulin (HCC) 07/01/2016   Abnormal EEG 07/15/2015   Rotator cuff tear 06/13/2015   Tonic-clonic generalized seizure (HCC) 05/11/2015   Seizure (HCC) 04/28/2015   Anterior shoulder dislocation 04/28/2015   Hypertension, essential    CIN I (cervical intraepithelial neoplasia I)    Hypothyroidism    Labial cyst    Past Medical History:  Diagnosis Date   Acute stress reaction    Allergic rhinitis    Anxiety    Autoimmune thyroiditis    CIN I (cervical intraepithelial neoplasia I)    Cirrhosis of liver (HCC)    COPD (chronic obstructive pulmonary disease) (HCC)    Depression    DM (diabetes mellitus) (HCC)    GERD (gastroesophageal reflux disease)    Hemochromatosis    Hepatic steatosis    Hereditary hemochromatosis (HCC)    Hyperlipidemia    Hypertension    Hypothyroidism    Labial cyst    Inclusion cyst   Liver cirrhosis (HCC)    Morbid obesity (HCC)    NASH (nonalcoholic steatohepatitis)    Positive ANA (antinuclear antibody)    Pure hypercholesterolemia    Seizures (HCC)    last seizure 3/17? over dose wellbutrin   Thyroid disease    Graves dis-Radioactive Iodine   Tobacco dependence     Family History  Problem Relation Age of Onset   Hypertension Mother    Heart disease Mother    Diabetes Father    Heart disease Father    Heart disease Brother    Breast cancer Maternal Grandmother    Uterine cancer Maternal Aunt    Seizures Neg Hx    Stomach cancer Neg Hx    Colon cancer Neg Hx     Past Surgical History:  Procedure Laterality Date   CERVICAL BIOPSY   W/ LOOP ELECTRODE EXCISION     Excision labial inclusion cyst   CESAREAN SECTION     COLPOSCOPY     HAND SURGERY     SHOULDER ARTHROSCOPY WITH OPEN ROTATOR CUFF REPAIR AND DISTAL CLAVICLE ACROMINECTOMY Left 06/13/2015   Procedure: LEFT SHOULDER ARTHROSCOPY, DEBRIDEMENT, OPEN ROTATOR CUFF REPAIR, CORACOID FRACTURE FIXATION, BICEPS TENODESIS;  Surgeon: Cammy Copa, MD;  Location: [facility] OR;  Service: Orthopedics;  Laterality: Left;   SHOULDER CLOSED REDUCTION Left 09/26/2015   Procedure: CLOSED MANIPULATION SHOULDER UNDER ANESTHESIA;  Surgeon: Lorin Picket  Rise Paganini, MD;  Location: [facility] OR;  Service: Orthopedics;  Laterality: Left;   TUBAL LIGATION     Social History   Occupational History   Not on file  Tobacco Use   Smoking status: Former    Packs/day: 1    Types: Cigarettes    Quit date: 03/23/2021    Years since quitting: 1.3   Smokeless tobacco: Never  Vaping Use   Vaping Use: Never used  Substance and Sexual Activity   Alcohol use: Yes    Alcohol/week: 0.0 standard drinks of alcohol    Comment: rare   Drug use: Yes    Types: Marijuana    Comment: Hx of marijuana use   Sexual activity: Yes    Birth control/protection: Surgical

## 2022-08-02 ENCOUNTER — Other Ambulatory Visit: Payer: Medicare HMO

## 2022-08-06 ENCOUNTER — Other Ambulatory Visit: Payer: Medicare HMO

## 2022-08-07 ENCOUNTER — Other Ambulatory Visit (INDEPENDENT_AMBULATORY_CARE_PROVIDER_SITE_OTHER): Payer: Medicare HMO

## 2022-08-07 DIAGNOSIS — E039 Hypothyroidism, unspecified: Secondary | ICD-10-CM | POA: Diagnosis not present

## 2022-08-07 LAB — TSH: TSH: 0.97 u[IU]/mL (ref 0.35–5.50)

## 2022-08-07 LAB — T4, FREE: Free T4: 1.53 ng/dL (ref 0.60–1.60)

## 2022-09-02 ENCOUNTER — Ambulatory Visit: Payer: Medicare HMO | Admitting: Surgical

## 2022-10-08 DIAGNOSIS — K219 Gastro-esophageal reflux disease without esophagitis: Secondary | ICD-10-CM | POA: Diagnosis not present

## 2022-10-08 DIAGNOSIS — I10 Essential (primary) hypertension: Secondary | ICD-10-CM | POA: Diagnosis not present

## 2022-10-08 DIAGNOSIS — E1165 Type 2 diabetes mellitus with hyperglycemia: Secondary | ICD-10-CM | POA: Diagnosis not present

## 2022-10-08 DIAGNOSIS — K7460 Unspecified cirrhosis of liver: Secondary | ICD-10-CM | POA: Diagnosis not present

## 2022-10-08 DIAGNOSIS — Z0000 Encounter for general adult medical examination without abnormal findings: Secondary | ICD-10-CM | POA: Diagnosis not present

## 2022-10-08 DIAGNOSIS — E7800 Pure hypercholesterolemia, unspecified: Secondary | ICD-10-CM | POA: Diagnosis not present

## 2022-10-08 DIAGNOSIS — E038 Other specified hypothyroidism: Secondary | ICD-10-CM | POA: Diagnosis not present

## 2022-10-29 ENCOUNTER — Ambulatory Visit: Payer: Medicare HMO | Admitting: Internal Medicine

## 2022-11-08 ENCOUNTER — Ambulatory Visit: Payer: Medicare HMO | Admitting: Internal Medicine

## 2022-11-18 ENCOUNTER — Other Ambulatory Visit: Payer: Self-pay | Admitting: Family Medicine

## 2022-11-18 DIAGNOSIS — Z1231 Encounter for screening mammogram for malignant neoplasm of breast: Secondary | ICD-10-CM

## 2022-11-19 ENCOUNTER — Ambulatory Visit
Admission: RE | Admit: 2022-11-19 | Discharge: 2022-11-19 | Disposition: A | Discharge status: Home / Self Care | Payer: Medicare HMO | Source: Ambulatory Visit | Attending: Family Medicine | Admitting: Family Medicine

## 2022-11-19 DIAGNOSIS — Z1231 Encounter for screening mammogram for malignant neoplasm of breast: Secondary | ICD-10-CM

## 2022-11-22 ENCOUNTER — Encounter: Payer: Self-pay | Admitting: Internal Medicine

## 2022-11-22 ENCOUNTER — Ambulatory Visit (INDEPENDENT_AMBULATORY_CARE_PROVIDER_SITE_OTHER): Payer: Medicare HMO | Admitting: Internal Medicine

## 2022-11-22 VITALS — BP 118/60 | HR 94 | Ht 62.0 in | Wt 151.2 lb

## 2022-11-22 DIAGNOSIS — Z7984 Long term (current) use of oral hypoglycemic drugs: Secondary | ICD-10-CM | POA: Diagnosis not present

## 2022-11-22 DIAGNOSIS — E1165 Type 2 diabetes mellitus with hyperglycemia: Secondary | ICD-10-CM | POA: Diagnosis not present

## 2022-11-22 DIAGNOSIS — E785 Hyperlipidemia, unspecified: Secondary | ICD-10-CM | POA: Diagnosis not present

## 2022-11-22 DIAGNOSIS — Z7985 Long-term (current) use of injectable non-insulin antidiabetic drugs: Secondary | ICD-10-CM

## 2022-11-22 DIAGNOSIS — E039 Hypothyroidism, unspecified: Secondary | ICD-10-CM | POA: Diagnosis not present

## 2022-11-22 LAB — MICROALBUMIN / CREATININE URINE RATIO
Creatinine,U: 25.7 mg/dL
Microalb Creat Ratio: 10.3 mg/g (ref 0.0–30.0)
Microalb, Ur: 2.7 mg/dL — ABNORMAL HIGH (ref 0.0–1.9)

## 2022-11-22 LAB — HEMOGLOBIN A1C: Hemoglobin A1C: 7.9

## 2022-11-22 MED ORDER — TRULICITY 1.5 MG/0.5ML ~~LOC~~ SOAJ: 1.5000 mg | SUBCUTANEOUS | 3 refills | Status: DC

## 2022-11-22 NOTE — Patient Instructions (Addendum)
STOP SWEET DRINKS!  Please continue: - Metformin ER 1000 mg at night - Jardiance 25 mg before breakfast   Restart: - Trulicity 1.5 mg weekly.  If you cannot obtain this, then restart: - Ozempic 0.25 mg weekly and try to titrate the dose up slowly  Please continue levothyroxine 150 mcg daily  Take the thyroid hormone every day, with water, at least 30 minutes before breakfast, separated by at least 4 hours from: - acid reflux medications - calcium - iron - multivitamins  Please stop at the lab.  Please return in 3 months with your sugar log.

## 2022-11-22 NOTE — Progress Notes (Signed)
Patient ID: Emma Glenn, female   DOB: 1965/07/06, 57 y.o.   MRN: [id number]   HPI: Emma Glenn is a 57 y.o.-year-old female, initially referred by her GI Dr, Dr. Rhea Belton, returning for follow-up for uncontrolled DM2, currently insulin independent, with complications (lacunar CVA 02/2021) and hypothyroidism. Last visit 5 months ago.  1 insurance: [employer] 2nd insurance: [employer]  Interim history: No increased urination, blurry vision, chest pain, nausea.  She does have shortness of breath.  She restarted to drink Pepsi.  DM2: Reviewed HbA1c levels: Lab Results  Component Value Date   HGBA1C 8.4 (A) 06/19/2022   HGBA1C 8.1 (A) 02/18/2022   HGBA1C 7.1 (A) 10/17/2021  Labs from [place], drawn on 07/29/2016: HbA1c still very high, at 12.6%  Pt is on a regimen of: - Metformin ER 1000 mg with breakfast >> at night - Jardiance 10 >> 25 mg before breakfast  - Trulicity 1.5 mg weekly - through patient assistance program >> off >> Ozempic 0.25 >> 0.5 mg weekly >> started 07/2022 but stopped 09/2022 2/2 nausea and decreased appetite. Previously also on: -  >> stopped 06/2019  She checks her sugars 0-1x a day: - am: 127, 171 >> 101-200 >> 174-212 >> 174-200 - 2h after b'fast:  125, 136 >> n/c >> 114 >> n/c - before lunch:  115 >> 91 >> 77-112 >> n/c - 2h after lunch: 228 >> 89 >> n/c >> 118 >> n/c - before dinner: 86, 104 >> 83 >> 132 >> n/c - 2h after dinner: 162 >> 131 >> 89 >> n/c >> 232 - bedtime:  <150 >> 123 >> n/c >> 98 >> n/c - nighttime: n/c Lowest sugar was 101 >> 174>> 106; it is unclear at which level she has hypoglycemia awareness: Highest sugar was 200 >> 212 >> 232.  [name] meals are: - Breakfast: Cereals or 2 eggs and toast - Lunch: Skips - Dinner: Meat and veggies - Snacks: Fruit, cookies Stopped milkshakes. She restudied and drink regular sodas and eats cookies at night.  Meter: Bayer Contour  -No CKD, last BUN/creatinine:  Lab Results  Component Value  Date   BUN 11 03/06/2022   BUN 9 10/17/2021   CREATININE 0.76 03/06/2022   CREATININE 0.68 10/17/2021    -+ HL; last set of lipids: Lab Results  Component Value Date   CHOL 97 (L) 03/06/2022   HDL 27 (L) 03/06/2022   LDLCALC 45 03/06/2022   TRIG 145 03/06/2022   CHOLHDL 3.6 03/06/2022   We restarted pravastatin 12/2016, after we consulted with her gastroenterologist, Dr. Rhea Belton.  LFTs remained stable after starting the statin but started to increase afterwards, being higher at last check in 08/2017.  Dr. Rhea Belton is aware of the above and considers these to be related to her fatty liver disease.  We continued her pravastatin >> 80 mg Lipitor daily- started 02/2021. Of note, she sees Dr. Vanessa Barbara (hepatologist).  Latest LFTs were normal at last check: Lab Results  Component Value Date   ALT 63 (H) 03/06/2022   AST 56 (H) 12/21/2021   ALKPHOS 70 12/21/2021   BILITOT 0.5 12/21/2021   ACR is fluctuating: Lab Results  Component Value Date   MICRALBCREAT 37.3 (H) 10/17/2021   MICRALBCREAT 44.9 (H) 04/19/2020   MICRALBCREAT 20.2 07/17/2018   MICRALBCREAT 237.7 (H) 04/02/2018   MICRALBCREAT 16.1 02/18/2017  07/29/2016: UACR was high, 148.9 Off Cozaar after she ran out.  - last eye exam: 02/12/2022: No DR reportedly.  -  No numbness and tingling in her feet.  Last foot exam 10/17/2021.  She had an acute CVA on 03/09/2021:  small acute infarct in the posterior left basal ganglia >>  R arm and leg paresis >> resolved.  She stopped smoking after her stroke.  Hypothyroidism:  Pt is on levothyroxine 150 mcg daily (increased 02/2022), taken: - in am - fasting - at least 30 min from b'fast - no calcium - no iron - no multivitamins - + PPIs at bedtime - occas. - not on Biotin  Reviewed her TFTs: Lab Results  Component Value Date   TSH 0.97 08/07/2022   TSH 6.90 (H) 06/19/2022   TSH 7.47 (H) 02/18/2022   TSH 6.62 (H) 12/21/2021   TSH 9.18 (H) 10/17/2021   TSH 2.552 03/09/2021    TSH 1.78 04/19/2020   TSH 0.33 (L) 03/10/2019   TSH 0.10 (L) 11/04/2018   TSH 0.12 (L) 07/17/2018   TSH 4.77 (H) 04/02/2018   TSH 2.65 05/21/2017   TSH 17.64 (H) 02/18/2017   TSH 4.45 06/04/2016   TSH 2.338 04/28/2015  07/29/2016: TSH 6.18.  Lab Results  Component Value Date   FREET4 1.53 08/07/2022   FREET4 1.26 06/19/2022   FREET4 1.08 02/18/2022   FREET4 1.34 12/21/2021   FREET4 1.20 10/17/2021   FREET4 1.41 04/19/2020   FREET4 1.21 03/10/2019   FREET4 1.68 (H) 11/04/2018   FREET4 1.60 07/17/2018   FREET4 1.36 05/21/2017   Reviewed previous history: She started to have severe generalized mm cramps in 03/2016.  Around that time >> RUQ pain >> HIDA scan >> GB w/o clear stones, but with sludge. She had an abd. CT scan: increased size of her liver + liver steatosis.  She was also found to have a 24-hour urine free cortisol, which returned slightly elevated: Component     Latest Ref Rng & Units 06/04/2016 06/14/2016  Cortisol (Ur), Free     4.0 - 50.0 mcg/24 h  57.0 (H)  Results received     0.63 - 2.50 g/24 h  1.27  Cortisol, Plasma     Ug/dL (collected at 5 pm) 6.8    A CT abdomen (05/2016) showed normal adrenals.  A brain MRI (04/2015) showed normal pituitary gland.  A dexamethasone suppression test was normal >> no signs of Cushing's syndrome:  Component     Latest Ref Rng & Units 07/02/2016  Cortisol - AM     mcg/dL 1.8 (L)  Dexamethasone, Serum     ng/dL 098   She had a liver Bx  >> has level 3 scarring. 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  ROS: + See HPI  I reviewed pt's medications, allergies, PMH, social hx, family hx, and changes were documented in the history of present illness. Otherwise, unchanged from my initial visit note.  Past Medical History:  Diagnosis Date   Acute stress reaction    Allergic rhinitis    Anxiety    Autoimmune thyroiditis    CIN I (cervical intraepithelial neoplasia I)    Cirrhosis of  liver (HCC)    COPD (chronic obstructive pulmonary disease) (HCC)    Depression    DM (diabetes mellitus) (HCC)    GERD (gastroesophageal reflux disease)    Hemochromatosis    Hepatic steatosis    Hereditary hemochromatosis (HCC)    Hyperlipidemia    Hypertension    Hypothyroidism    Labial cyst    Inclusion cyst   Liver cirrhosis (  HCC)    Morbid obesity (HCC)    NASH (nonalcoholic steatohepatitis)    Positive ANA (antinuclear antibody)    Pure hypercholesterolemia    Seizures (HCC)    last seizure 3/17? over dose wellbutrin   Thyroid disease    Graves dis-Radioactive Iodine   Tobacco dependence    Past Surgical History:  Procedure Laterality Date   CERVICAL BIOPSY  W/ LOOP ELECTRODE EXCISION     Excision labial inclusion cyst   CESAREAN SECTION     COLPOSCOPY     HAND SURGERY     SHOULDER ARTHROSCOPY WITH OPEN ROTATOR CUFF REPAIR AND DISTAL CLAVICLE ACROMINECTOMY Left 06/13/2015   Procedure: LEFT SHOULDER ARTHROSCOPY, DEBRIDEMENT, OPEN ROTATOR CUFF REPAIR, CORACOID FRACTURE FIXATION, BICEPS TENODESIS;  Surgeon: Cammy Copa, MD;  Location: [facility] OR;  Service: Orthopedics;  Laterality: Left;   SHOULDER CLOSED REDUCTION Left 09/26/2015   Procedure: CLOSED MANIPULATION SHOULDER UNDER ANESTHESIA;  Surgeon: Cammy Copa, MD;  Location: [facility] OR;  Service: Orthopedics;  Laterality: Left;   TUBAL LIGATION     Social History   Social History   Marital status: Married    Spouse name: Brynda Greathouse   Number of children: 1   Years of education: 12   Occupational History   Administrator, [employer]    Social History Main Topics   Smoking status: Former Smoker    Packs/day: 1.00    Types: Cigarettes    Quit date: 02/14/2015   Smokeless tobacco: Never Used   Alcohol use 0.0 oz/week     Comment: rare   Drug use: No     Comment: Hx of marijuana use   Sexual activity: Yes    Birth control/ protection: Surgical   Social History Narrative   Lives with husband and sister   Caffeine use:  Tea/coffee daily      Current Outpatient Medications on File Prior to Visit  Medication Sig Dispense Refill   amLODipine (NORVASC) 10 MG tablet Take 1 tablet (10 mg total) by mouth daily. 90 tablet 3   aspirin EC 81 MG EC tablet Take 1 tablet (81 mg total) by mouth daily with breakfast. take Aspirin 81 mg daily along with Plavix 75 mg daily for 30 days then after that STOP the Plavix  and continue ONLY Aspirin 81 mg daily indefinitely-- 30 tablet 11   atorvastatin (LIPITOR) 80 MG tablet Take 1 tablet (80 mg total) by mouth daily. Take in place of Pravastatin 90 tablet 3   BEVESPI AEROSPHERE 9-4.8 MCG/ACT AERO INHALE 2 PUFFS INTO THE LUNGS TWICE DAILY. REPLACES ANORO INHALER. 10.7 g 6   Dulaglutide (TRULICITY) 1.5 MG/0.5ML SOPN Inject 1.5 mg into the skin once a week. 6 mL 3   empagliflozin (JARDIANCE) 25 MG TABS tablet Take 1 tablet (25 mg total) by mouth daily. 90 tablet 3   glucose blood (BAYER CONTOUR TEST) test strip Use 2x a day 200 each 3   levothyroxine (SYNTHROID) 150 MCG tablet Take 1 tablet (150 mcg total) by mouth daily before breakfast. 45 tablet 3   losartan (COZAAR) 50 MG tablet Take 1 tablet (50 mg total) by mouth daily. 90 tablet 3   metFORMIN (GLUCOPHAGE-XR) 500 MG 24 hr tablet TAKE 2 TABLETS EVERY DAY 180 tablet 3   omeprazole (PRILOSEC) 40 MG capsule Take 1 capsule (40 mg total) by mouth daily. 30 capsule 6   ondansetron (ZOFRAN) 4 MG tablet Take 1 tablet (4 mg total) by mouth every 8 (eight) hours as needed for nausea or  vomiting. 10 tablet 0   Semaglutide,0.25 or 0.5MG /DOS, 2 MG/3ML SOPN Inject 0.5 mg weekly under skin 9 mL 3   SURE COMFORT PEN NEEDLES 32G X 4 MM MISC USE TO INJECT LANTUS ONCE DAILY 100 each 3   No current facility-administered medications on file prior to visit.   Allergies  Allergen Reactions   Doxycycline Other (See Comments)    Flu like symptoms (high fever, chills)   Nitrofurantoin Monohyd Macro Other (See Comments)    Flu like symptoms (high  fever, chills)   Septra [Sulfamethoxazole-Trimethoprim] Other (See Comments)    Flu like symptoms (high fever, chills)   Bupropion     Other Reaction(s): seizure   Clopidogrel     Other Reaction(s): hives   Lamictal [Lamotrigine] Rash   Family History  Problem Relation Age of Onset   Hypertension Mother    Heart disease Mother    Diabetes Father    Heart disease Father    Heart disease Brother    Breast cancer Maternal Grandmother    Uterine cancer Maternal Aunt    Seizures Neg Hx    Stomach cancer Neg Hx    Colon cancer Neg Hx    PE: BP 118/60   Pulse 94   Ht 5\' 2"  (1.575 m)   Wt 151 lb 3.2 oz (68.6 kg)   LMP 04/25/2016   SpO2 97%   BMI 27.65 kg/m  Wt Readings from Last 3 Encounters:  11/22/22 151 lb 3.2 oz (68.6 kg)  07/22/22 156 lb (70.8 kg)  06/19/22 161 lb 12.8 oz (73.4 kg)   Constitutional: normal weight, in NAD Eyes: EOMI, no exophthalmos ENT: no thyromegaly, no cervical lymphadenopathy Cardiovascular: Tachycardia, RR, No MRG Respiratory: CTA B Musculoskeletal: no deformities Skin: no rashes Neurological: no tremor with outstretched hands Diabetic Foot Exam - Simple   Simple Foot Form Diabetic Foot exam was performed with the following findings: Yes 11/22/2022 11:20 AM  Visual Inspection No deformities, no ulcerations, no other skin breakdown bilaterally: Yes Sensation Testing Intact to touch and monofilament testing bilaterally: Yes Pulse Check Posterior Tibialis and Dorsalis pulse intact bilaterally: Yes Comments    ASSESSMENT: 1. DM2, non-insulin-dependent, with complications -acute CVA -03/09/2021  2. Hypothyroidism  -Post ablative, after RAI treatment for Graves' disease  3. HL  PLAN: 1. DM2 -Patient with history of uncontrolled type 2 diabetes, on oral antidiabetic regimen with SGLT2 inhibitor and metformin, and also on GLP-1 receptor agonist added at last visit until recently, but stopped due to nausea.   -At last visit, she was not  able to obtain Trulicity due to national shortage and sugars were quite high.  I recommended to try to add Ozempic and to improve diet by eliminating regular sodas.  HbA1c was higher, at 8.4%. -At today's visit, sugars appears to be much improved up until 09/2022, for the period of time when she was on Ozempic, but unfortunately she stopped the medication due to nausea after she increased the dose.  Sugars increase significantly, up to 200s in the morning.  Upon questioning, she also started to drink regular Pepsi.  I strongly advised her to stop regular soda and discussed about side effects.  For now, we discussed about adding back a GLP-1 receptor agonist.  This is now free for her so she does not need to go to the patient assistance program.  Also, [place] [facility] is preferred.  She would be interested in starting back on Trulicity and I sent a prescription for this to [facility].  She tried to obtain it from the local pharmacy but they do not have it in stock.  We did discuss that if she is not able to start back on Trulicity, she can start at a low dose of Ozempic and stay on the lower dose or titrated very slowly, by several clicks per week.  Will otherwise continue metformin and Jardiance. - I suggested to:  Patient Instructions  STOP SWEET DRINKS!  Please continue: - Metformin ER 1000 mg at night - Jardiance 25 mg before breakfast   Restart: - Trulicity 1.5 mg weekly.  If you cannot obtain this, then restart: - Ozempic 0.25 mg weekly and try to titrate the dose up slowly  Please continue levothyroxine 150 mcg daily  Take the thyroid hormone every day, with water, at least 30 minutes before breakfast, separated by at least 4 hours from: - acid reflux medications - calcium - iron - multivitamins  Please stop at the lab.  Please return in 3 months with your sugar log.   - we checked her HbA1c: 7.9% (lower) - advised to check sugars at different times of the day - 1x a day,  rotating check times - advised for yearly eye exams >> she is UTD - will check an ACR today - return to clinic in 3 months      2. Hypothyroidism -Developed after RAI ablation - latest thyroid labs reviewed with pt. >> normal: Lab Results  Component Value Date   TSH 0.97 08/07/2022  - she continues on LT4 150 mcg daily - pt feels good on this dose. - we discussed about taking the thyroid hormone every day, with water, >30 minutes before breakfast, separated by >4 hours from acid reflux medications, calcium, iron, multivitamins. Pt. is taking it correctly.  3. HL  -Reviewed latest lipid panel: At goal with exception of a low HDL: Lab Results  Component Value Date   CHOL 97 (L) 03/06/2022   HDL 27 (L) 03/06/2022   LDLCALC 45 03/06/2022   TRIG 145 03/06/2022   CHOLHDL 3.6 03/06/2022  -Continues atorvastatin 80 mg daily without side effects  Carlus Pavlov, MD PhD [facility] Endocrinology

## 2022-11-27 ENCOUNTER — Telehealth: Payer: Self-pay

## 2022-11-27 NOTE — Telephone Encounter (Signed)
Patient called stating she needs a PA for Trulicity 1.5 mg

## 2022-11-29 ENCOUNTER — Other Ambulatory Visit (HOSPITAL_COMMUNITY): Payer: Self-pay

## 2022-12-02 ENCOUNTER — Other Ambulatory Visit (HOSPITAL_COMMUNITY): Payer: Self-pay

## 2022-12-02 ENCOUNTER — Other Ambulatory Visit: Payer: Self-pay

## 2022-12-02 MED ORDER — TRULICITY 1.5 MG/0.5ML ~~LOC~~ SOAJ: 1.5000 mg | SUBCUTANEOUS | 3 refills | Status: DC

## 2022-12-02 NOTE — Telephone Encounter (Signed)
Patient called and states that [facility] sent the request to [facility] by where the patient lives and she received a text from [facility] stating that her refill was ready but she did not pick it up due to the cost of $245. Her insurance cover [facility] mail order and so I have sent a script there and she is going to call [facility] and cancel. Please proceed with PA .

## 2022-12-02 NOTE — Telephone Encounter (Signed)
Requested Prescriptions   Signed Prescriptions Disp Refills   Dulaglutide (TRULICITY) 1.5 MG/0.5ML SOAJ 6 mL 3    Sig: Inject 1.5 mg into the skin once a week.    Authorizing Provider: Carlus Pavlov    Ordering User: Pollie Meyer

## 2022-12-16 DIAGNOSIS — R799 Abnormal finding of blood chemistry, unspecified: Secondary | ICD-10-CM | POA: Diagnosis not present

## 2022-12-16 DIAGNOSIS — R772 Abnormality of alphafetoprotein: Secondary | ICD-10-CM | POA: Diagnosis not present

## 2022-12-16 DIAGNOSIS — D376 Neoplasm of uncertain behavior of liver, gallbladder and bile ducts: Secondary | ICD-10-CM | POA: Diagnosis not present

## 2022-12-16 DIAGNOSIS — R945 Abnormal results of liver function studies: Secondary | ICD-10-CM | POA: Diagnosis not present

## 2022-12-16 DIAGNOSIS — K7460 Unspecified cirrhosis of liver: Secondary | ICD-10-CM | POA: Diagnosis not present

## 2022-12-16 DIAGNOSIS — R791 Abnormal coagulation profile: Secondary | ICD-10-CM | POA: Diagnosis not present

## 2022-12-16 DIAGNOSIS — K7581 Nonalcoholic steatohepatitis (NASH): Secondary | ICD-10-CM | POA: Diagnosis not present

## 2022-12-30 ENCOUNTER — Other Ambulatory Visit: Payer: Self-pay | Admitting: Nurse Practitioner

## 2022-12-30 DIAGNOSIS — D376 Neoplasm of uncertain behavior of liver, gallbladder and bile ducts: Secondary | ICD-10-CM

## 2022-12-30 DIAGNOSIS — K7460 Unspecified cirrhosis of liver: Secondary | ICD-10-CM

## 2023-01-06 ENCOUNTER — Other Ambulatory Visit: Payer: Self-pay | Admitting: Cardiovascular Disease

## 2023-01-06 DIAGNOSIS — R931 Abnormal findings on diagnostic imaging of heart and coronary circulation: Secondary | ICD-10-CM

## 2023-01-06 DIAGNOSIS — E782 Mixed hyperlipidemia: Secondary | ICD-10-CM

## 2023-01-23 ENCOUNTER — Other Ambulatory Visit: Payer: Self-pay | Admitting: Internal Medicine

## 2023-01-23 ENCOUNTER — Ambulatory Visit
Admission: RE | Admit: 2023-01-23 | Discharge: 2023-01-23 | Disposition: A | Discharge status: Home / Self Care | Payer: Medicare HMO | Source: Ambulatory Visit | Attending: Nurse Practitioner | Admitting: Nurse Practitioner

## 2023-01-23 DIAGNOSIS — K7689 Other specified diseases of liver: Secondary | ICD-10-CM | POA: Diagnosis not present

## 2023-01-23 DIAGNOSIS — D376 Neoplasm of uncertain behavior of liver, gallbladder and bile ducts: Secondary | ICD-10-CM

## 2023-01-23 DIAGNOSIS — K7460 Unspecified cirrhosis of liver: Secondary | ICD-10-CM

## 2023-01-23 DIAGNOSIS — R160 Hepatomegaly, not elsewhere classified: Secondary | ICD-10-CM | POA: Diagnosis not present

## 2023-01-23 MED ORDER — GADOPICLENOL 0.5 MMOL/ML IV SOLN
7.0000 mL | Freq: Once | INTRAVENOUS | Status: AC | PRN
  Administered 2023-01-23: 7 mL via INTRAVENOUS

## 2023-02-13 ENCOUNTER — Other Ambulatory Visit: Payer: Self-pay | Admitting: Internal Medicine

## 2023-02-14 DIAGNOSIS — H04123 Dry eye syndrome of bilateral lacrimal glands: Secondary | ICD-10-CM | POA: Diagnosis not present

## 2023-02-14 DIAGNOSIS — H25813 Combined forms of age-related cataract, bilateral: Secondary | ICD-10-CM | POA: Diagnosis not present

## 2023-02-14 DIAGNOSIS — H35363 Drusen (degenerative) of macula, bilateral: Secondary | ICD-10-CM | POA: Diagnosis not present

## 2023-02-14 DIAGNOSIS — H524 Presbyopia: Secondary | ICD-10-CM | POA: Diagnosis not present

## 2023-02-14 DIAGNOSIS — E119 Type 2 diabetes mellitus without complications: Secondary | ICD-10-CM | POA: Diagnosis not present

## 2023-02-27 ENCOUNTER — Encounter: Payer: Self-pay | Admitting: Internal Medicine

## 2023-02-27 ENCOUNTER — Ambulatory Visit: Payer: Medicare HMO | Admitting: Internal Medicine

## 2023-02-27 VITALS — BP 130/80 | HR 85 | Ht 62.0 in | Wt 153.6 lb

## 2023-02-27 DIAGNOSIS — Z7984 Long term (current) use of oral hypoglycemic drugs: Secondary | ICD-10-CM

## 2023-02-27 DIAGNOSIS — Z7985 Long-term (current) use of injectable non-insulin antidiabetic drugs: Secondary | ICD-10-CM

## 2023-02-27 DIAGNOSIS — E785 Hyperlipidemia, unspecified: Secondary | ICD-10-CM

## 2023-02-27 DIAGNOSIS — E039 Hypothyroidism, unspecified: Secondary | ICD-10-CM

## 2023-02-27 DIAGNOSIS — E1165 Type 2 diabetes mellitus with hyperglycemia: Secondary | ICD-10-CM

## 2023-02-27 DIAGNOSIS — Z794 Long term (current) use of insulin: Secondary | ICD-10-CM | POA: Diagnosis not present

## 2023-02-27 LAB — POCT GLYCOSYLATED HEMOGLOBIN (HGB A1C): Hemoglobin A1C: 8.1 % — AB (ref 4.0–5.6)

## 2023-02-27 MED ORDER — INSULIN PEN NEEDLE 32G X 4 MM MISC: 3 refills | Status: DC

## 2023-02-27 MED ORDER — LANTUS SOLOSTAR 100 UNIT/ML ~~LOC~~ SOPN: 20.0000 [IU] | PEN_INJECTOR | Freq: Every day | SUBCUTANEOUS | 11 refills | Status: DC

## 2023-02-27 NOTE — Patient Instructions (Addendum)
STOP SWEET DRINKS!  Please continue: - Metformin ER 1000 mg at night - Jardiance 25 mg before breakfast   Start: - Lantus 12 units at bedtime, and increase by 2 units every 2 days until sugars in am are at goal: <130  Restart: - Trulicity 1.5 mg weekly.  Please continue levothyroxine 150 mcg daily  Take the thyroid hormone every day, with water, at least 30 minutes before breakfast, separated by at least 4 hours from: - acid reflux medications - calcium - iron - multivitamins  Please return in 3-4 months with your sugar log.

## 2023-02-27 NOTE — Progress Notes (Signed)
Patient ID: Emma Glenn, female   DOB: [birth year], 58 y.o.   MRN: [id number]   HPI: Emma Glenn is a 58 y.o.-year-old female, initially referred by her GI Dr, Dr. Rhea Belton, returning for follow-up for uncontrolled DM2, currently insulin independent, with complications (lacunar CVA 02/2021) and hypothyroidism. Last visit 5 months ago.  1st insurance: [employer] 2nd insurance: [employer]  Interim history: No increased urination, blurry vision, chest pain.  However, she did have nausea and gas/eructations after starting and especially increasing Ozempic, despite using very low doses. She reduced Pepsi since last visit but did not stop completely.   DM2: Reviewed HbA1c levels: 11/22/2022: HbA1c 7.9% Lab Results  Component Value Date   HGBA1C 8.4 (A) 06/19/2022   HGBA1C 8.1 (A) 02/18/2022   HGBA1C 7.1 (A) 10/17/2021  Labs from [facility], drawn on 07/29/2016: HbA1c still very high, at 12.6%  Pt is on a regimen of: - Metformin ER 1000 mg with breakfast >> at night - Jardiance 10 >> 25 mg before breakfast  - Trulicity 1.5 mg weekly - through patient assistance program >> off >> Ozempic 0.25 >> 0.5 mg weekly >> started 07/2022 but stopped 09/2022 2/2 nausea and decreased appetite >> started Ozempic 0.25 mcg >> gas, eructations, nausea Previously also on: -Lantus 43 units, then decreased and finally started 06/2019  She checks her sugars 0-1x a day: - am: 127, 171 >> 101-200 >> 174-212 >> 174-200>> 115, 174, 200, 246 - 2h after b'fast:  125, 136 >> n/c >> 114 >> n/c  - before lunch:  115 >> 91 >> 77-112 >> n/c >> 166, 167 - 2h after lunch: 228 >> 89 >> n/c >> 118 >> n/c - before dinner: 86, 104 >> 83 >> 132 >> n/c >> 170, 172, 213 - 2h after dinner: 162 >> 131 >> 89 >> n/c >> 232 >> 176 - bedtime:  <150 >> 123 >> n/c >> 98 >> n/c - nighttime: n/c Lowest sugar was 101 >> 174>> 106 >> 115; it is unclear at which level she has hypoglycemia awareness: Highest sugar was 200 >> 212 >> 232 >>  246  [name] meals are: - Breakfast: Cereals or 2 eggs and toast - Lunch: Skips - Dinner: Meat and veggies - Snacks: Fruit, cookies Stopped milkshakes. She restudied and drink regular sodas and eats cookies at night.  Meter: Bayer Contour  -No CKD, last BUN/creatinine:  12/16/2022: 10/0.68, GFR 101, Glu 173 Lab Results  Component Value Date   BUN 11 03/06/2022   BUN 9 10/17/2021   CREATININE 0.76 03/06/2022   CREATININE 0.68 10/17/2021    -+ HL; last set of lipids: Lab Results  Component Value Date   CHOL 97 (L) 03/06/2022   HDL 27 (L) 03/06/2022   LDLCALC 45 03/06/2022   TRIG 145 03/06/2022   CHOLHDL 3.6 03/06/2022   We restarted pravastatin 12/2016, after we consulted with her gastroenterologist, Dr. Rhea Belton.  LFTs remained stable after starting the statin but started to increase afterwards, being higher at last check in 08/2017.  Dr. Rhea Belton is aware of the above and considers these to be related to her fatty liver disease.  We continued her pravastatin >> 80 mg Lipitor daily- started 02/2021. Of note, she sees Dr. Vanessa Barbara (hepatologist).  Latest LFTs were normal at last check: Lab Results  Component Value Date   ALT 63 (H) 03/06/2022   AST 56 (H) 12/21/2021   ALKPHOS 70 12/21/2021   BILITOT 0.5 12/21/2021   ACR is fluctuating:  Lab Results  Component Value Date   MICRALBCREAT 10.3 11/22/2022   MICRALBCREAT 37.3 (H) 10/17/2021   MICRALBCREAT 44.9 (H) 04/19/2020   MICRALBCREAT 20.2 07/17/2018   MICRALBCREAT 237.7 (H) 04/02/2018   MICRALBCREAT 16.1 02/18/2017  07/29/2016: UACR was high, 148.9 On Cozaar 50 mg daily.  - last eye exam: 02/14/2023: No DR reportedly.   - No numbness and tingling in her feet.  Last foot exam 11/22/2022.  She had an acute CVA on 03/09/2021:  small acute infarct in the posterior left basal ganglia >>  R arm and leg paresis >> resolved.  She stopped smoking after her stroke.  Hypothyroidism:  Pt is on levothyroxine 150 mcg daily  (increased 02/2022), taken: - in am - fasting - at least 30 min from b'fast - no calcium - no iron - no multivitamins - + PPIs at bedtime - occas. - not on Biotin  Reviewed her TFTs: Lab Results  Component Value Date   TSH 0.97 08/07/2022   TSH 6.90 (H) 06/19/2022   TSH 7.47 (H) 02/18/2022   TSH 6.62 (H) 12/21/2021   TSH 9.18 (H) 10/17/2021   TSH 2.552 03/09/2021   TSH 1.78 04/19/2020   TSH 0.33 (L) 03/10/2019   TSH 0.10 (L) 11/04/2018   TSH 0.12 (L) 07/17/2018   TSH 4.77 (H) 04/02/2018   TSH 2.65 05/21/2017   TSH 17.64 (H) 02/18/2017   TSH 4.45 06/04/2016   TSH 2.338 04/28/2015  07/29/2016: TSH 6.18.  Lab Results  Component Value Date   FREET4 1.53 08/07/2022   FREET4 1.26 06/19/2022   FREET4 1.08 02/18/2022   FREET4 1.34 12/21/2021   FREET4 1.20 10/17/2021   FREET4 1.41 04/19/2020   FREET4 1.21 03/10/2019   FREET4 1.68 (H) 11/04/2018   FREET4 1.60 07/17/2018   FREET4 1.36 05/21/2017   Reviewed previous history: She started to have severe generalized mm cramps in 03/2016.  Around that time >> RUQ pain >> HIDA scan >> GB w/o clear stones, but with sludge. She had an abd. CT scan: increased size of her liver + liver steatosis.  She was also found to have a 24-hour urine free cortisol, which returned slightly elevated: Component     Latest Ref Rng & Units 06/04/2016 06/14/2016  Cortisol (Ur), Free     4.0 - 50.0 mcg/24 h  57.0 (H)  Results received     0.63 - 2.50 g/24 h  1.27  Cortisol, Plasma     Ug/dL (collected at 5 pm) 6.8    A CT abdomen (05/2016) showed normal adrenals.  A brain MRI (04/2015) showed normal pituitary gland.  A dexamethasone suppression test was normal >> no signs of Cushing's syndrome:  Component     Latest Ref Rng & Units 07/02/2016  Cortisol - AM     mcg/dL 1.8 (L)  Dexamethasone, Serum     ng/dL 962   She had a liver Bx  >> has level 3 scarring. 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  ROS: +  See HPI  I reviewed pt's medications, allergies, PMH, social hx, family hx, and changes were documented in the history of present illness. Otherwise, unchanged from my initial visit note.  Past Medical History:  Diagnosis Date   Acute stress reaction    Allergic rhinitis    Anxiety    Autoimmune thyroiditis    CIN I (cervical intraepithelial neoplasia I)    Cirrhosis of liver (HCC)    COPD (chronic obstructive  pulmonary disease) (HCC)    Depression    DM (diabetes mellitus) (HCC)    GERD (gastroesophageal reflux disease)    Hemochromatosis    Hepatic steatosis    Hereditary hemochromatosis (HCC)    Hyperlipidemia    Hypertension    Hypothyroidism    Labial cyst    Inclusion cyst   Liver cirrhosis (HCC)    Morbid obesity (HCC)    NASH (nonalcoholic steatohepatitis)    Positive ANA (antinuclear antibody)    Pure hypercholesterolemia    Seizures (HCC)    last seizure 3/17? over dose wellbutrin   Thyroid disease    Graves dis-Radioactive Iodine   Tobacco dependence    Past Surgical History:  Procedure Laterality Date   CERVICAL BIOPSY  W/ LOOP ELECTRODE EXCISION     Excision labial inclusion cyst   CESAREAN SECTION     COLPOSCOPY     HAND SURGERY     SHOULDER ARTHROSCOPY WITH OPEN ROTATOR CUFF REPAIR AND DISTAL CLAVICLE ACROMINECTOMY Left 06/13/2015   Procedure: LEFT SHOULDER ARTHROSCOPY, DEBRIDEMENT, OPEN ROTATOR CUFF REPAIR, CORACOID FRACTURE FIXATION, BICEPS TENODESIS;  Surgeon: Cammy Copa, MD;  Location: [facility] OR;  Service: Orthopedics;  Laterality: Left;   SHOULDER CLOSED REDUCTION Left 09/26/2015   Procedure: CLOSED MANIPULATION SHOULDER UNDER ANESTHESIA;  Surgeon: Cammy Copa, MD;  Location: [facility] OR;  Service: Orthopedics;  Laterality: Left;   TUBAL LIGATION     Social History   Social History   Marital status: Married    Spouse name: Brynda Greathouse   Number of children: 1   Years of education: 12   Occupational History   Administrator, [employer]    Social  History Main Topics   Smoking status: Former Smoker    Packs/day: 1.00    Types: Cigarettes    Quit date: 02/14/2015   Smokeless tobacco: Never Used   Alcohol use 0.0 oz/week     Comment: rare   Drug use: No     Comment: Hx of marijuana use   Sexual activity: Yes    Birth control/ protection: Surgical   Social History Narrative   Lives with husband and sister   Caffeine use: Tea/coffee daily      Current Outpatient Medications on File Prior to Visit  Medication Sig Dispense Refill   amLODipine (NORVASC) 10 MG tablet Take 1 tablet (10 mg total) by mouth daily. 90 tablet 3   aspirin EC 81 MG EC tablet Take 1 tablet (81 mg total) by mouth daily with breakfast. take Aspirin 81 mg daily along with Plavix 75 mg daily for 30 days then after that STOP the Plavix  and continue ONLY Aspirin 81 mg daily indefinitely-- 30 tablet 11   atorvastatin (LIPITOR) 80 MG tablet Take 1 tablet (80 mg total) by mouth daily. Take in place of Pravastatin 90 tablet 3   BEVESPI AEROSPHERE 9-4.8 MCG/ACT AERO INHALE 2 PUFFS INTO THE LUNGS TWICE DAILY. REPLACES ANORO INHALER. 10.7 g 6   Dulaglutide (TRULICITY) 1.5 MG/0.5ML SOAJ Inject 1.5 mg into the skin once a week. 6 mL 3   empagliflozin (JARDIANCE) 25 MG TABS tablet Take 1 tablet (25 mg total) by mouth daily. 90 tablet 0   glucose blood (BAYER CONTOUR TEST) test strip Use 2x a day 200 each 3   levothyroxine (SYNTHROID) 150 MCG tablet Take 1 tablet (150 mcg total) by mouth daily before breakfast. 30 tablet 1   losartan (COZAAR) 50 MG tablet Take 1 tablet (50 mg total) by mouth daily. 90  tablet 0   metFORMIN (GLUCOPHAGE-XR) 500 MG 24 hr tablet TAKE 2 TABLETS EVERY DAY 180 tablet 3   omeprazole (PRILOSEC) 40 MG capsule Take 1 capsule (40 mg total) by mouth daily. 30 capsule 6   ondansetron (ZOFRAN) 4 MG tablet Take 1 tablet (4 mg total) by mouth every 8 (eight) hours as needed for nausea or vomiting. 10 tablet 0   SURE COMFORT PEN NEEDLES 32G X 4 MM MISC USE TO  INJECT LANTUS ONCE DAILY 100 each 3   No current facility-administered medications on file prior to visit.   Allergies  Allergen Reactions   Doxycycline Other (See Comments)    Flu like symptoms (high fever, chills)   Nitrofurantoin Monohyd Macro Other (See Comments)    Flu like symptoms (high fever, chills)   Septra [Sulfamethoxazole-Trimethoprim] Other (See Comments)    Flu like symptoms (high fever, chills)   Bupropion     Other Reaction(s): seizure   Clopidogrel     Other Reaction(s): hives   Lamictal [Lamotrigine] Rash   Family History  Problem Relation Age of Onset   Hypertension Mother    Heart disease Mother    Diabetes Father    Heart disease Father    Heart disease Brother    Breast cancer Maternal Grandmother    Uterine cancer Maternal Aunt    Seizures Neg Hx    Stomach cancer Neg Hx    Colon cancer Neg Hx    PE: BP 130/80   Pulse 85   Ht 5\' 2"  (1.575 m)   Wt 153 lb 9.6 oz (69.7 kg)   LMP 04/25/2016   SpO2 96%   BMI 28.09 kg/m  Wt Readings from Last 3 Encounters:  02/27/23 153 lb 9.6 oz (69.7 kg)  11/22/22 151 lb 3.2 oz (68.6 kg)  07/22/22 156 lb (70.8 kg)   Constitutional: normal weight, in NAD Eyes: EOMI, no exophthalmos ENT: no thyromegaly, no cervical lymphadenopathy Cardiovascular: RRR, No MRG Respiratory: CTA B Musculoskeletal: no deformities Skin: no rashes Neurological: no tremor with outstretched hands  ASSESSMENT: 1. DM2, non-insulin-dependent, with complications -acute CVA -03/09/2021  2. Hypothyroidism  -Post ablative, after RAI treatment for Graves' disease  3. HL  PLAN: 1. DM2 -Patient with history of uncontrolled type 2 diabetes, on oral antidiabetic regimen with SGLT2 inhibitor and metformin and also GLP-1 receptor agonist previously, but stopped due to nausea.  At last visit, HbA1c was slightly better, at 7.9%, decreased from 8.4%.  At that time, she was drinking regular Pepsi and I strongly advised her to start regular sodas  or any other sweet drinks.  We also discussed about adding back a GLP-1 receptor agonist.  This was free for her at last visit.  I recommended to start back on Trulicity but if she could not, to switch to Ozempic low-dose.  I advised her that if she is able to start this, she could titrate it up very slowly, by several clicks per week, and she had nausea in the past with a higher dose.  We continued the same dose of metformin and Jardiance. -At today's visit, she is not checking sugars consistently, but whenever checked, sugars are mostly above target.  She was not able to start Trulicity but she did start Ozempic at a low dose and increase slowly, currently on 0.25 mg weekly.  Even on the lowest doses, she does have GI discomfort and feels poorly so we need to stop it at today's visit.  We did discuss that we  need to start long-acting insulin and I suggested to start at a lower dose, 12 units daily and increase gradually.  She would still want to try to obtain Trulicity, which she tolerated well in the past but she was not able to find any more due to the national shortage. -I again advised her to try to stop sugary drinks completely.  She reduced Pepsi intake but did not stop it. - I suggested to:  Patient Instructions  STOP SWEET DRINKS!  Please continue: - Metformin ER 1000 mg at night - Jardiance 25 mg before breakfast   Start: - Lantus 12 units at bedtime, and increase by 2 units every 2 days until sugars in am are at goal: <130  Restart: - Trulicity 1.5 mg weekly.  Please continue levothyroxine 150 mcg daily  Take the thyroid hormone every day, with water, at least 30 minutes before breakfast, separated by at least 4 hours from: - acid reflux medications - calcium - iron - multivitamins  Please return in 3-4 months with your sugar log.   - we checked her HbA1c: 8.1% (higher) - advised to check sugars at different times of the day - 1x a day, rotating check times - advised for  yearly eye exams >> she is UTD - return to clinic in 3-4 months      2. Hypothyroidism -Developed after RAI ablation - latest thyroid labs reviewed with pt. >> normal: Lab Results  Component Value Date   TSH 0.97 08/07/2022  - she continues on LT4 150 mcg daily - pt feels good on this dose. - we discussed about taking the thyroid hormone every day, with water, >30 minutes before breakfast, separated by >4 hours from acid reflux medications, calcium, iron, multivitamins. Pt. is taking it correctly. -Will repeat the labs at next visit.  3. HL  -Latest lipid panel was reviewed: At goal with exception of a low HDL: Lab Results  Component Value Date   CHOL 97 (L) 03/06/2022   HDL 27 (L) 03/06/2022   LDLCALC 45 03/06/2022   TRIG 145 03/06/2022   CHOLHDL 3.6 03/06/2022  -She is on atorvastatin 80 mg daily without side effects  Carlus Pavlov, MD PhD [facility]

## 2023-03-11 ENCOUNTER — Other Ambulatory Visit: Payer: Self-pay | Admitting: Internal Medicine

## 2023-03-11 NOTE — Telephone Encounter (Signed)
Levothyroxine refill request complete

## 2023-03-20 ENCOUNTER — Other Ambulatory Visit: Payer: Self-pay | Admitting: Internal Medicine

## 2023-04-04 DIAGNOSIS — J0100 Acute maxillary sinusitis, unspecified: Secondary | ICD-10-CM | POA: Diagnosis not present

## 2023-04-04 DIAGNOSIS — J208 Acute bronchitis due to other specified organisms: Secondary | ICD-10-CM | POA: Diagnosis not present

## 2023-05-16 ENCOUNTER — Ambulatory Visit: Admitting: Surgical

## 2023-05-16 ENCOUNTER — Other Ambulatory Visit (INDEPENDENT_AMBULATORY_CARE_PROVIDER_SITE_OTHER): Payer: Self-pay

## 2023-05-16 ENCOUNTER — Other Ambulatory Visit: Payer: Self-pay

## 2023-05-16 DIAGNOSIS — M545 Low back pain, unspecified: Secondary | ICD-10-CM | POA: Diagnosis not present

## 2023-05-16 DIAGNOSIS — M25511 Pain in right shoulder: Secondary | ICD-10-CM

## 2023-05-16 DIAGNOSIS — M7521 Bicipital tendinitis, right shoulder: Secondary | ICD-10-CM

## 2023-05-21 ENCOUNTER — Encounter: Payer: Self-pay | Admitting: Surgical

## 2023-05-21 MED ORDER — LIDOCAINE HCL 1 % IJ SOLN
5.0000 mL | INTRAMUSCULAR | Status: AC | PRN
  Administered 2023-05-16: 5 mL

## 2023-05-21 MED ORDER — BUPIVACAINE HCL 0.25 % IJ SOLN
9.0000 mL | INTRAMUSCULAR | Status: AC | PRN
Start: 2023-05-16 — End: 2023-05-16
  Administered 2023-05-16: 9 mL via INTRA_ARTICULAR

## 2023-05-21 MED ORDER — METHYLPREDNISOLONE ACETATE 40 MG/ML IJ SUSP
40.0000 mg | INTRAMUSCULAR | Status: AC | PRN
  Administered 2023-05-16: 40 mg via INTRA_ARTICULAR

## 2023-05-21 NOTE — Progress Notes (Signed)
 Office Visit Note   Patient: Emma Glenn           Date of Birth: 1965-11-19           MRN: [id number] Visit Date: 05/16/2023 Requested by: Noberto Retort, MD [phone] [address] [place],  [place] [address] PCP: Noberto Retort, MD  Subjective: Chief Complaint  Patient presents with   Right Shoulder - Pain   Lower Back - Pain    HPI: Emma Glenn is a 58 y.o. female who presents to the office reporting right shoulder pain.  Patient states that she has had 2 months of pain without history of injury.  Localizes pain to the anterior aspect of the shoulder.  No radiation of pain.  No history of prior right shoulder surgery.  She has stiffness on occasion but no mechanical symptoms.  Is painful to sleep on her right side.  She has no numbness or radicular pain.  Pain is worse with rotation of the shoulder and with raising up above her shoulder.  She has history of diabetes with last A1c below 8 by her history.  She also complains of left-sided low back pain over the last 3 days.  She sustained a twisting injury while bending over but has no pain radiating past the sides of her low back.  No buttock pain.  No numbness or tingling.  No history of back surgery.  She has never really had any ESI's or prior advanced imaging of her low back.  Has not really improved much in the last few days.  Takes Tylenol and uses heat without much relief..                ROS: All systems reviewed are negative as they relate to the chief complaint within the history of present illness.  Patient denies fevers or chills.  Assessment & Plan: Visit Diagnoses:  1. Biceps tendinitis, right   2. Acute left-sided low back pain without sciatica   3. Acute pain of right shoulder     Plan: Impression is right shoulder with 2 months of pain without injury.  Primarily has pain localizing to the anterior aspect of the shoulder without radiation.  Most of her tenderness overlies the bicipital groove today with  positive O'Brien sign on exam.  No real rotator cuff weakness on exam today.  After discussion of options, she would like to try injection to see if this will alleviate her shoulder pain.  If this does not resolve her pain, next step would be MRI arthrogram to further evaluate for bicep-labral complex pathology.  Regarding the low back pain, this is only been going on 3 days and with her mechanism of injury, seems more likely to be muscular strain of the low back.  Do not think that there is any intervention to be done at this time aside from ice and heat and over-the-counter medications.  Plan for her to follow-up with the office as needed if pain does not improve with the shoulder or the back.  Follow-Up Instructions: No follow-ups on file.   Orders:  Orders Placed This Encounter  Procedures   XR Shoulder Right   XR Lumbar Spine 2-3 Views   US Guided Needle Placement - No Linked Charges   No orders of the defined types were placed in this encounter.     Procedures: Large Joint Inj: R glenohumeral on 05/16/2023 11:55 AM Details: 22 G 3.5 in needle, ultrasound-guided posterior approach Medications:  5 mL lidocaine 1 %; 9 mL bupivacaine 0.25 %; 40 mg methylPREDNISolone acetate 40 MG/ML Outcome: tolerated well, no immediate complications Procedure, treatment alternatives, risks and benefits explained, specific risks discussed. Consent was given by the patient. Patient was prepped and draped in the usual sterile fashion.       Clinical Data: No additional findings.  Objective: Vital Signs: LMP 04/25/2016   Physical Exam:  Constitutional: Patient appears well-developed HEENT:  Head: Normocephalic Eyes:EOM are normal Neck: Normal range of motion Cardiovascular: Normal rate Pulmonary/chest: Effort normal Neurologic: Patient is alert Skin: Skin is warm Psychiatric: Patient has normal mood and affect  Ortho Exam: Ortho exam demonstrates right shoulder with 70 degrees X rotation,  110 degrees abduction, 170 degrees forward elevation passively and actively.  This compared with the left shoulder with 45 degrees X rotation, 90 degrees abduction, 150 degrees forward elevation passively and actively.  Intact rotator cuff strength of supra, infra, subscap.  Tenderness over the bicipital groove.  No tenderness of the AC joint.  No cellulitis or skin changes noted in the right shoulder region.  Axillary nerve is intact with deltoid firing.  2+ radial pulse of the right upper extremity.  Intact EPL, FPL, finger abduction.  On exam, patient has intact hip flexion, quadricep, hamstring, dorsiflexion, plantarflexion rated 5/5 bilaterally.  No clonus noted bilaterally.  She has tenderness throughout the left-sided paraspinal musculature but no right-sided tenderness or axial lumbar spine tenderness.  Specialty Comments:  No specialty comments available.  Imaging: No results found.   PMFS History: Patient Active Problem List   Diagnosis Date Noted   Acute CVA (cerebrovascular accident) (HCC) 03/09/2021   Elevated coronary artery calcium score 03/08/2021   Hyperlipidemia 08/27/2017   Elevated urinary free cortisol level 07/01/2016   Type 2 diabetes mellitus with hyperglycemia, without long-term current use of insulin (HCC) 07/01/2016   Abnormal EEG 07/15/2015   Rotator cuff tear 06/13/2015   Tonic-clonic generalized seizure (HCC) 05/11/2015   Seizure (HCC) 04/28/2015   Anterior shoulder dislocation 04/28/2015   Hypertension, essential    CIN I (cervical intraepithelial neoplasia I)    Hypothyroidism    Labial cyst    Past Medical History:  Diagnosis Date   Acute stress reaction    Allergic rhinitis    Anxiety    Autoimmune thyroiditis    CIN I (cervical intraepithelial neoplasia I)    Cirrhosis of liver (HCC)    COPD (chronic obstructive pulmonary disease) (HCC)    Depression    DM (diabetes mellitus) (HCC)    GERD (gastroesophageal reflux disease)    Hemochromatosis     Hepatic steatosis    Hereditary hemochromatosis (HCC)    Hyperlipidemia    Hypertension    Hypothyroidism    Labial cyst    Inclusion cyst   Liver cirrhosis (HCC)    Morbid obesity (HCC)    NASH (nonalcoholic steatohepatitis)    Positive ANA (antinuclear antibody)    Pure hypercholesterolemia    Seizures (HCC)    last seizure 3/17? over dose wellbutrin   Thyroid disease    Graves dis-Radioactive Iodine   Tobacco dependence     Family History  Problem Relation Age of Onset   Hypertension Mother    Heart disease Mother    Diabetes Father    Heart disease Father    Heart disease Brother    Breast cancer Maternal Grandmother    Uterine cancer Maternal Aunt    Seizures Neg Hx    Stomach cancer Neg  Hx    Colon cancer Neg Hx     Past Surgical History:  Procedure Laterality Date   CERVICAL BIOPSY  W/ LOOP ELECTRODE EXCISION     Excision labial inclusion cyst   CESAREAN SECTION     COLPOSCOPY     HAND SURGERY     SHOULDER ARTHROSCOPY WITH OPEN ROTATOR CUFF REPAIR AND DISTAL CLAVICLE ACROMINECTOMY Left 06/13/2015   Procedure: LEFT SHOULDER ARTHROSCOPY, DEBRIDEMENT, OPEN ROTATOR CUFF REPAIR, CORACOID FRACTURE FIXATION, BICEPS TENODESIS;  Surgeon: Cammy Copa, MD;  Location: [facility] OR;  Service: Orthopedics;  Laterality: Left;   SHOULDER CLOSED REDUCTION Left 09/26/2015   Procedure: CLOSED MANIPULATION SHOULDER UNDER ANESTHESIA;  Surgeon: Cammy Copa, MD;  Location: [facility] OR;  Service: Orthopedics;  Laterality: Left;   TUBAL LIGATION     Social History   Occupational History   Not on file  Tobacco Use   Smoking status: Former    Current packs/day: 0.00    Types: Cigarettes    Quit date: 03/23/2021    Years since quitting: 2.1   Smokeless tobacco: Never  Vaping Use   Vaping status: Never Used  Substance and Sexual Activity   Alcohol use: Yes    Alcohol/week: 0.0 standard drinks of alcohol    Comment: rare   Drug use: Yes    Types: Marijuana    Comment: Hx of  marijuana use   Sexual activity: Yes    Birth control/protection: Surgical

## 2023-05-22 ENCOUNTER — Encounter: Payer: Self-pay | Admitting: Cardiovascular Disease

## 2023-05-22 NOTE — Progress Notes (Unsigned)
 Cardiology Office Note:    Date:  05/23/2023   ID:  Emma Glenn, DOB [birth year], MRN [id number]  PCP:  Noberto Retort, MD   [facility] Providers Cardiologist:   Armstrong Creasy      Referring MD: Noberto Retort, MD   Chief Complaint  Patient presents with   Hyperlipidemia    History of Present Illness:    Emma Glenn is a 58 y.o. female with a hx of  HTN,  HLD. We were asked to see her by Dr. Tiburcio Pea for further evaluation and management of her HTN  Emma Glenn had HTN several years ago .   Was ok for a while , now she is back on meds.  Has lots of health issues Is not able to exercise much ,  waks some  Does not have enough energy to exercise Takes care of her mother , causes lots of stress   Eats lots of cereal  Sandwich for lunch - [employer] , [employer], [employer]  [employer]  Bacon, sausage, bologna   Smokes 1 ppd - had quit for 3 years, restarted 2 years Wt is 147 lbs   Multiple family members ( father, brother, grandmother, all had CAD )   Is on Pravastatin for HLD - Dr. Elvera Lennox put her on Prava because of her NASH LDL is 154 ( while off pravastatin )  Will get a coronary calcium score.  If her coronary calcium score is high then we will refer her to lipid clinic for consideration of PCSK9 inhibitors or other medicines.  We will want to start her on something that will not affect her nonalcoholic liver disease.  March 06, 2022:  [name] is seen today for follow-up visit.  She has a history of hyperlipidemia.  She has a history of cigarette smoking.  She has a history of nonalcoholic liver cirrhosis.  We ordered a coronary calcium score in an effort to assess her risk of having hyperlipidemia since she is limited in which statin she can safely take. Coronary calcium score from January, 2023 was 1.53 which places her in the 71st percentile for age and sex matched controls.  She was admitted with a CVA in January, 2023.  Has stopped smoking   May 23, 2023 Emma Glenn is  seen for follow up of her HLD, hx of smoking, CAC  Her brother passed several weeks ago and she started smoking for 2 weeks Has stopped again  No real exercise Waits on her mother   No cp, no dyspnea      Past Medical History:  Diagnosis Date   Acute stress reaction    Allergic rhinitis    Anxiety    Autoimmune thyroiditis    CIN I (cervical intraepithelial neoplasia I)    Cirrhosis of liver (HCC)    COPD (chronic obstructive pulmonary disease) (HCC)    Depression    DM (diabetes mellitus) (HCC)    GERD (gastroesophageal reflux disease)    Hemochromatosis    Hepatic steatosis    Hereditary hemochromatosis (HCC)    Hyperlipidemia    Hypertension    Hypothyroidism    Labial cyst    Inclusion cyst   Liver cirrhosis (HCC)    Morbid obesity (HCC)    NASH (nonalcoholic steatohepatitis)    Positive ANA (antinuclear antibody)    Pure hypercholesterolemia    Seizures (HCC)    last seizure 3/17? over dose wellbutrin   Thyroid disease    Graves dis-Radioactive Iodine   Tobacco  dependence     Past Surgical History:  Procedure Laterality Date   CERVICAL BIOPSY  W/ LOOP ELECTRODE EXCISION     Excision labial inclusion cyst   CESAREAN SECTION     COLPOSCOPY     HAND SURGERY     SHOULDER ARTHROSCOPY WITH OPEN ROTATOR CUFF REPAIR AND DISTAL CLAVICLE ACROMINECTOMY Left 06/13/2015   Procedure: LEFT SHOULDER ARTHROSCOPY, DEBRIDEMENT, OPEN ROTATOR CUFF REPAIR, CORACOID FRACTURE FIXATION, BICEPS TENODESIS;  Surgeon: Cammy Copa, MD;  Location: [facility] OR;  Service: Orthopedics;  Laterality: Left;   SHOULDER CLOSED REDUCTION Left 09/26/2015   Procedure: CLOSED MANIPULATION SHOULDER UNDER ANESTHESIA;  Surgeon: Cammy Copa, MD;  Location: [facility] OR;  Service: Orthopedics;  Laterality: Left;   TUBAL LIGATION      Current Medications: Current Meds  Medication Sig   amLODipine (NORVASC) 10 MG tablet Take 1 tablet (10 mg total) by mouth daily.   aspirin EC 81 MG EC tablet Take 1  tablet (81 mg total) by mouth daily with breakfast. take Aspirin 81 mg daily along with Plavix 75 mg daily for 30 days then after that STOP the Plavix  and continue ONLY Aspirin 81 mg daily indefinitely--   atorvastatin (LIPITOR) 80 MG tablet Take 1 tablet (80 mg total) by mouth daily. Take in place of Pravastatin   BEVESPI AEROSPHERE 9-4.8 MCG/ACT AERO INHALE 2 PUFFS INTO THE LUNGS TWICE DAILY. REPLACES ANORO INHALER.   Dulaglutide (TRULICITY) 1.5 MG/0.5ML SOAJ Inject 1.5 mg into the skin once a week.   empagliflozin (JARDIANCE) 25 MG TABS tablet Take 1 tablet (25 mg total) by mouth daily.   glucose blood (BAYER CONTOUR TEST) test strip Use 2x a day   insulin glargine (LANTUS SOLOSTAR) 100 UNIT/ML Solostar Pen Inject 20 Units into the skin at bedtime.   Insulin Pen Needle 32G X 4 MM MISC Use 1x a day   levothyroxine (SYNTHROID) 150 MCG tablet Take 1 tablet (150 mcg total) by mouth daily before breakfast.   losartan (COZAAR) 50 MG tablet Take 1 tablet (50 mg total) by mouth daily.   metFORMIN (GLUCOPHAGE-XR) 500 MG 24 hr tablet TAKE 2 TABLETS EVERY DAY   omeprazole (PRILOSEC) 40 MG capsule Take 1 capsule (40 mg total) by mouth daily.     Allergies:   Doxycycline, Nitrofurantoin monohyd macro, Septra [sulfamethoxazole-trimethoprim], Bupropion, Clopidogrel, and Lamictal [lamotrigine]   Social History   Socioeconomic History   Marital status: Married    Spouse name: Brynda Greathouse   Number of children: 1   Years of education: 12   Highest education level: Not on file  Occupational History   Not on file  Tobacco Use   Smoking status: Former    Current packs/day: 0.00    Types: Cigarettes    Quit date: 03/23/2021    Years since quitting: 2.1   Smokeless tobacco: Never  Vaping Use   Vaping status: Never Used  Substance and Sexual Activity   Alcohol use: Yes    Alcohol/week: 0.0 standard drinks of alcohol    Comment: rare   Drug use: Yes    Types: Marijuana    Comment: Hx of marijuana use    Sexual activity: Yes    Birth control/protection: Surgical  Other Topics Concern   Not on file  Social History Narrative   Lives with husband and sister   Caffeine use: Tea/coffee daily   Social Drivers of Corporate investment banker Strain: Not on file  Food Insecurity: Low Risk  (06/13/2022)  [facility]   Hunger Vital Sign    Worried About Running Out of Food in the Last Year: Never true    Ran Out of Food in the Last Year: Never true  Transportation Needs: No Transportation Needs (06/13/2022)   [facility]   Transportation    In the past 12 months, has lack of reliable transportation kept you from medical appointments, meetings, work or from getting things needed for daily living? : No  Physical Activity: Not on file  Stress: Not on file  Social Connections: Not on file     Family History: The patient's family history includes Breast cancer in her maternal grandmother; Diabetes in her father; Heart disease in her brother, father, and mother; Hypertension in her mother; Uterine cancer in her maternal aunt. There is no history of Seizures, Stomach cancer, or Colon cancer.  ROS:   Please see the history of present illness.     All other systems reviewed and are negative.  EKGs/Labs/Other Studies Reviewed:    The following studies were reviewed today:   EKG:    EKG Interpretation Date/Time:  Friday May 23 2023 15:37:24 EDT Ventricular Rate:  88 PR Interval:  154 QRS Duration:  86 QT Interval:  370 QTC Calculation: 447 R Axis:   -2  Text Interpretation: Normal sinus rhythm Normal ECG When compared with ECG of 09-Mar-2021 08:34, No significant change since last tracing Confirmed by Kristeen Miss ([id number]) on 05/23/2023 3:53:23 PM     Recent Labs: 08/07/2022: TSH 0.97  Recent Lipid Panel    Component Value Date/Time   CHOL 97 (L) 03/06/2022 1648   TRIG 145 03/06/2022 1648   HDL 27 (L) 03/06/2022 1648    CHOLHDL 3.6 03/06/2022 1648   CHOLHDL 6 10/17/2021 1106   VLDL 35.4 10/17/2021 1106   LDLCALC 45 03/06/2022 1648   LDLCALC 131 (H) 12/13/2016 1654     Risk Assessment/Calculations:           Physical Exam:     Physical Exam: Blood pressure 124/78, pulse 87, height 5\' 2"  (1.575 m), weight 152 lb 6.4 oz (69.1 kg), last menstrual period 04/25/2016, SpO2 96%.       GEN:  Well nourished, well developed in no acute distress HEENT: Normal NECK: No JVD; No carotid bruits LYMPHATICS: No lymphadenopathy CARDIAC: RRR , no murmurs, rubs, gallops RESPIRATORY:  Clear to auscultation without rales, wheezing or rhonchi  ABDOMEN: Soft, non-tender, non-distended MUSCULOSKELETAL:  No edema; No deformity  SKIN: Warm and dry NEUROLOGIC:  Alert and oriented x 3      ASSESSMENT:    1. Mixed hyperlipidemia      PLAN:       1.  Hypertension:  Blood pressure is well-controlled.  Continue current medications.  2.  Hyperlipidemia:   Will check lipids today.  Continue atorvastatin 80 mg a day.    Medication Adjustments/Labs and Tests Ordered: Current medicines are reviewed at length with the patient today.  Concerns regarding medicines are outlined above.  Orders Placed This Encounter  Procedures   EKG 12-Lead   No orders of the defined types were placed in this encounter.    There are no Patient Instructions on file for this visit.   Signed, Kristeen Miss, MD  05/23/2023 3:53 PM    [facility]

## 2023-05-23 ENCOUNTER — Encounter: Payer: Self-pay | Admitting: Cardiovascular Disease

## 2023-05-23 ENCOUNTER — Ambulatory Visit: Attending: Cardiology | Admitting: Cardiovascular Disease

## 2023-05-23 VITALS — BP 124/78 | HR 87 | Ht 62.0 in | Wt 152.4 lb

## 2023-05-23 DIAGNOSIS — Z79899 Other long term (current) drug therapy: Secondary | ICD-10-CM | POA: Diagnosis not present

## 2023-05-23 DIAGNOSIS — E782 Mixed hyperlipidemia: Secondary | ICD-10-CM | POA: Diagnosis not present

## 2023-05-23 NOTE — Patient Instructions (Signed)
 Lab Work: Lipids, ALT, BMET today If you have labs (blood work) drawn today and your tests are completely normal, you will receive your results only by: MyChart Message (if you have MyChart) OR A paper copy in the mail If you have any lab test that is abnormal or we need to change your treatment, we will call you to review the results.  Follow-Up: At [facility], you and your health needs are our priority.  As part of our continuing mission to provide you with exceptional heart care, our providers are all part of one team.  This team includes your primary Cardiologist (physician) and Advanced Practice Providers or APPs (Physician Assistants and Nurse Practitioners) who all work together to provide you with the care you need, when you need it.  Your next appointment:   1 year(s)  Provider:   Kristeen Miss, MD      1st Floor: - Lobby - Registration  - Pharmacy  - Lab - Cafe  2nd Floor: - PV Lab - Diagnostic Testing (echo, CT, nuclear med)  3rd Floor: - Vacant  4th Floor: - TCTS (cardiothoracic surgery) - AFib Clinic - Structural Heart Clinic - Vascular Surgery  - Vascular Ultrasound  5th Floor: - HeartCare Cardiology (general and EP) - Clinical Pharmacy for coumadin, hypertension, lipid, weight-loss medications, and med management appointments    Valet parking services will be available as well.

## 2023-05-24 ENCOUNTER — Other Ambulatory Visit: Payer: Self-pay | Admitting: Internal Medicine

## 2023-05-24 LAB — BASIC METABOLIC PANEL WITH GFR
BUN/Creatinine Ratio: 17 (ref 9–23)
BUN: 12 mg/dL (ref 6–24)
CO2: 22 mmol/L (ref 20–29)
Calcium: 9.9 mg/dL (ref 8.7–10.2)
Chloride: 101 mmol/L (ref 96–106)
Creatinine, Ser: 0.69 mg/dL (ref 0.57–1.00)
Glucose: 143 mg/dL — ABNORMAL HIGH (ref 70–99)
Potassium: 4 mmol/L (ref 3.5–5.2)
Sodium: 142 mmol/L (ref 134–144)
eGFR: 100 mL/min/{1.73_m2} (ref >59)

## 2023-05-24 LAB — LIPID PANEL
Chol/HDL Ratio: 3.9 ratio (ref 0.0–4.4)
Cholesterol, Total: 124 mg/dL (ref 100–199)
HDL: 32 mg/dL — ABNORMAL LOW (ref >39)
LDL Chol Calc (NIH): 62 mg/dL (ref 0–99)
Triglycerides: 180 mg/dL — ABNORMAL HIGH (ref 0–149)
VLDL Cholesterol Cal: 30 mg/dL (ref 5–40)

## 2023-05-24 LAB — ALT: ALT: 62 IU/L — ABNORMAL HIGH (ref 0–32)

## 2023-05-26 ENCOUNTER — Encounter: Payer: Self-pay | Admitting: Cardiovascular Disease

## 2023-06-11 ENCOUNTER — Encounter: Payer: Self-pay | Admitting: Internal Medicine

## 2023-06-11 MED ORDER — LANTUS SOLOSTAR 100 UNIT/ML ~~LOC~~ SOPN: 50.0000 [IU] | PEN_INJECTOR | Freq: Every day | SUBCUTANEOUS | 11 refills | Status: DC

## 2023-06-16 ENCOUNTER — Ambulatory Visit: Admitting: Surgical

## 2023-06-17 ENCOUNTER — Encounter: Payer: Self-pay | Admitting: Internal Medicine

## 2023-06-17 ENCOUNTER — Ambulatory Visit (INDEPENDENT_AMBULATORY_CARE_PROVIDER_SITE_OTHER): Payer: Medicare HMO | Admitting: Internal Medicine

## 2023-06-17 VITALS — BP 120/70 | HR 101 | Ht 62.0 in | Wt 153.2 lb

## 2023-06-17 DIAGNOSIS — E039 Hypothyroidism, unspecified: Secondary | ICD-10-CM

## 2023-06-17 DIAGNOSIS — E785 Hyperlipidemia, unspecified: Secondary | ICD-10-CM

## 2023-06-17 DIAGNOSIS — Z794 Long term (current) use of insulin: Secondary | ICD-10-CM | POA: Diagnosis not present

## 2023-06-17 DIAGNOSIS — Z7984 Long term (current) use of oral hypoglycemic drugs: Secondary | ICD-10-CM | POA: Diagnosis not present

## 2023-06-17 DIAGNOSIS — E1165 Type 2 diabetes mellitus with hyperglycemia: Secondary | ICD-10-CM

## 2023-06-17 DIAGNOSIS — Z7985 Long-term (current) use of injectable non-insulin antidiabetic drugs: Secondary | ICD-10-CM | POA: Diagnosis not present

## 2023-06-17 LAB — POCT GLYCOSYLATED HEMOGLOBIN (HGB A1C): Hemoglobin A1C: 8.2 % — AB (ref 4.0–5.6)

## 2023-06-17 MED ORDER — FREESTYLE LIBRE 3 PLUS SENSOR MISC: 1.0000 | 3 refills | Status: AC

## 2023-06-17 MED ORDER — TRULICITY 1.5 MG/0.5ML ~~LOC~~ SOAJ: 1.5000 mg | SUBCUTANEOUS | 3 refills | Status: DC

## 2023-06-17 MED ORDER — FREESTYLE LIBRE 3 READER DEVI: 1.0000 | Freq: Once | 0 refills | Status: AC

## 2023-06-17 NOTE — Progress Notes (Signed)
 Patient ID: Emma Glenn, female   DOB: 27-Oct-1965, 58 y.o.   MRN: [id number]   HPI: Emma Glenn is a 58 y.o.-year-old female, initially referred by her GI Dr, Dr. Bridgett Camps, returning for follow-up for uncontrolled DM2, currently insulin  dependent, with complications (lacunar CVA 02/2021) and hypothyroidism. Last visit 4 months ago.  1st insurance: [employer] 2nd insurance: [employer]  Interim history: No increased urination, blurry vision, chest pain.  She reduced Pepsi before last visit but did not stop completely.  She had a steroid inj 1 mo ago. She lost her brother in 04/2023 - heart ds.  DM2: Reviewed HbA1c levels: Lab Results  Component Value Date   HGBA1C 8.1 (A) 02/27/2023   HGBA1C 7.9 11/22/2022   HGBA1C 8.4 (A) 06/19/2022  11/22/2022: HbA1c 7.9% 07/29/2016: HbA1c 12.6%  Pt is on a regimen of: - Metformin  ER 1000 mg with breakfast >> at night - Jardiance  10 >> 25 mg before breakfast  - Trulicity  1.5 mg weekly - PAP >> off >> Ozempic  >> started 07/2022 but stopped 09/2022 2/2 nausea and decreased appetite >> started Ozempic  0.25 mcg >> gas, eructations, nausea >> - did not start Previously also on: -Lantus  43 units, then decreased and finally started 06/2019  She checks her sugars 0-1x a day: - am: 174-212 >> 174-200>> 115, 174, 200, 246 >> 147-204 (last 2 weeks: 140-170) - 2h after b'fast:  125, 136 >> n/c >> 114 >> n/c  - before lunch: 91 >> 77-112 >> n/c >> 166, 167 >> n/c - 2h after lunch: 228 >> 89 >> n/c >> 118 >> n/c - before dinner: 83 >> 132 >> n/c >> 170, 172, 213 >> n/c - 2h after dinner: 131 >> 89 >> n/c >> 232 >> 176 >> n/c - bedtime:  <150 >> 123 >> n/c >> 98 >> n/c - nighttime: n/c Lowest sugar was 174 >> 106 >> 115 >> 147; it is unclear at which level she has hypoglycemia awareness: Highest sugar was 232 >> 246 >> 204  [name] meals are: - Breakfast: Cereals or 2 eggs and toast - Lunch: Skips - Dinner: Meat and veggies - Snacks: Fruit,  cookies Stopped milkshakes.  Meter: Bayer Contour  -No CKD, last BUN/creatinine:  Lab Results  Component Value Date   BUN 12 05/23/2023   BUN 11 03/06/2022   CREATININE 0.69 05/23/2023   CREATININE 0.76 03/06/2022    -+ HL; last set of lipids: Lab Results  Component Value Date   CHOL 124 05/23/2023   HDL 32 (L) 05/23/2023   LDLCALC 62 05/23/2023   TRIG 180 (H) 05/23/2023   CHOLHDL 3.9 05/23/2023   We restarted pravastatin  12/2016, after we consulted with her gastroenterologist, Dr. Bridgett Camps.  LFTs remained stable after starting the statin but started to increase afterwards, being higher at last check in 08/2017.  Dr. Bridgett Camps is aware of the above and considers these to be related to her fatty liver disease.  We continued her pravastatin  >> 80 mg Lipitor daily- started 02/2021. Of note, she sees Dr. Karyl Paget (hepatologist).  Latest LFTs: Lab Results  Component Value Date   ALT 62 (H) 05/23/2023   AST 56 (H) 12/21/2021   ALKPHOS 70 12/21/2021   BILITOT 0.5 12/21/2021   ACR is fluctuating: Lab Results  Component Value Date   MICRALBCREAT 10.3 11/22/2022   MICRALBCREAT 37.3 (H) 10/17/2021   MICRALBCREAT 44.9 (H) 04/19/2020   MICRALBCREAT 20.2 07/17/2018   MICRALBCREAT 237.7 (H) 04/02/2018   MICRALBCREAT 16.1  02/18/2017  07/29/2016: UACR was high, 148.9 On Cozaar  50 mg daily.  - last eye exam: 02/14/2023: No DR reportedly.   - No numbness and tingling in her feet.  Last foot exam 11/22/2022.  She had an acute CVA on 03/09/2021:  small acute infarct in the posterior left basal ganglia >>  R arm and leg paresis >> resolved.  She stopped smoking after her stroke.  Hypothyroidism:  Pt is on levothyroxine  150 mcg daily (increased 02/2022), taken: - in am - fasting - at least 30 min from b'fast - no calcium  - no iron - no multivitamins - + PPIs at bedtime - not on Biotin  Reviewed her TFTs: Lab Results  Component Value Date   TSH 0.97 08/07/2022   TSH 6.90 (H)  06/19/2022   TSH 7.47 (H) 02/18/2022   TSH 6.62 (H) 12/21/2021   TSH 9.18 (H) 10/17/2021   TSH 2.552 03/09/2021   TSH 1.78 04/19/2020   TSH 0.33 (L) 03/10/2019   TSH 0.10 (L) 11/04/2018   TSH 0.12 (L) 07/17/2018   TSH 4.77 (H) 04/02/2018   TSH 2.65 05/21/2017   TSH 17.64 (H) 02/18/2017   TSH 4.45 06/04/2016   TSH 2.338 04/28/2015  07/29/2016: TSH 6.18.  Lab Results  Component Value Date   FREET4 1.53 08/07/2022   FREET4 1.26 06/19/2022   FREET4 1.08 02/18/2022   FREET4 1.34 12/21/2021   FREET4 1.20 10/17/2021   FREET4 1.41 04/19/2020   FREET4 1.21 03/10/2019   FREET4 1.68 (H) 11/04/2018   FREET4 1.60 07/17/2018   FREET4 1.36 05/21/2017   Reviewed previous history: She started to have severe generalized mm cramps in 03/2016.  Around that time >> RUQ pain >> HIDA scan >> GB w/o clear stones, but with sludge. She had an abd. CT scan: increased size of her liver + liver steatosis.  She was also found to have a 24-hour urine free cortisol, which returned slightly elevated: Component     Latest Ref Rng & Units 06/04/2016 06/14/2016  Cortisol (Ur), Free     4.0 - 50.0 mcg/24 h  57.0 (H)  Results received     0.63 - 2.50 g/24 h  1.27  Cortisol, Plasma     Ug/dL (collected at 5 pm) 6.8    A CT abdomen (05/2016) showed normal adrenals.  A brain MRI (04/2015) showed normal pituitary gland.  A dexamethasone  suppression test was normal >> no signs of Cushing's syndrome:  Component     Latest Ref Rng & Units 07/02/2016  Cortisol - AM     mcg/dL 1.8 (L)  Dexamethasone , Serum     ng/dL 161   She had a liver Bx  >> has level 3 scarring. 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  ROS: + See HPI  I reviewed pt's medications, allergies, PMH, social hx, family hx, and changes were documented in the history of present illness. Otherwise, unchanged from my initial visit note.  Past Medical History:  Diagnosis Date   Acute stress reaction    Allergic  rhinitis    Anxiety    Autoimmune thyroiditis    CIN I (cervical intraepithelial neoplasia I)    Cirrhosis of liver (HCC)    COPD (chronic obstructive pulmonary disease) (HCC)    Depression    DM (diabetes mellitus) (HCC)    GERD (gastroesophageal reflux disease)    Hemochromatosis    Hepatic steatosis    Hereditary hemochromatosis (HCC)    Hyperlipidemia  Hypertension    Hypothyroidism    Labial cyst    Inclusion cyst   Liver cirrhosis (HCC)    Morbid obesity (HCC)    NASH (nonalcoholic steatohepatitis)    Positive ANA (antinuclear antibody)    Pure hypercholesterolemia    Seizures (HCC)    last seizure 3/17? over dose wellbutrin    Thyroid  disease    Graves dis-Radioactive Iodine   Tobacco dependence    Past Surgical History:  Procedure Laterality Date   CERVICAL BIOPSY  W/ LOOP ELECTRODE EXCISION     Excision labial inclusion cyst   CESAREAN SECTION     COLPOSCOPY     HAND SURGERY     SHOULDER ARTHROSCOPY WITH OPEN ROTATOR CUFF REPAIR AND DISTAL CLAVICLE ACROMINECTOMY Left 06/13/2015   Procedure: LEFT SHOULDER ARTHROSCOPY, DEBRIDEMENT, OPEN ROTATOR CUFF REPAIR, CORACOID FRACTURE FIXATION, BICEPS TENODESIS;  Surgeon: Jasmine Mesi, MD;  Location: [facility] OR;  Service: Orthopedics;  Laterality: Left;   SHOULDER CLOSED REDUCTION Left 09/26/2015   Procedure: CLOSED MANIPULATION SHOULDER UNDER ANESTHESIA;  Surgeon: Jasmine Mesi, MD;  Location: [facility] OR;  Service: Orthopedics;  Laterality: Left;   TUBAL LIGATION     Social History   Social History   Marital status: Married    Spouse name: Marney Sinning   Number of children: 1   Years of education: 12   Occupational History   Administrator, [employer]    Social History Main Topics   Smoking status: Former Smoker    Packs/day: 1.00    Types: Cigarettes    Quit date: 02/14/2015   Smokeless tobacco: Never Used   Alcohol use 0.0 oz/week     Comment: rare   Drug use: No     Comment: Hx of marijuana use   Sexual activity: Yes     Birth control/ protection: Surgical   Social History Narrative   Lives with husband and sister   Caffeine use: Tea/coffee daily      Current Outpatient Medications on File Prior to Visit  Medication Sig Dispense Refill   amLODipine  (NORVASC ) 10 MG tablet Take 1 tablet (10 mg total) by mouth daily. 90 tablet 3   aspirin  EC 81 MG EC tablet Take 1 tablet (81 mg total) by mouth daily with breakfast. take Aspirin  81 mg daily along with Plavix  75 mg daily for 30 days then after that STOP the Plavix   and continue ONLY Aspirin  81 mg daily indefinitely-- 30 tablet 11   atorvastatin  (LIPITOR) 80 MG tablet Take 1 tablet (80 mg total) by mouth daily. Take in place of Pravastatin  90 tablet 3   BEVESPI  AEROSPHERE 9-4.8 MCG/ACT AERO INHALE 2 PUFFS INTO THE LUNGS TWICE DAILY. REPLACES ANORO INHALER. 10.7 g 6   Dulaglutide  (TRULICITY ) 1.5 MG/0.5ML SOAJ Inject 1.5 mg into the skin once a week. 6 mL 3   empagliflozin  (JARDIANCE ) 25 MG TABS tablet Take 1 tablet (25 mg total) by mouth daily. 90 tablet 0   glucose blood (BAYER CONTOUR TEST) test strip Use 2x a day 200 each 3   insulin  glargine (LANTUS  SOLOSTAR) 100 UNIT/ML Solostar Pen Inject 50 Units into the skin at bedtime. 15 mL 11   Insulin  Pen Needle 32G X 4 MM MISC Use 1x a day 100 each 3   levothyroxine  (SYNTHROID ) 150 MCG tablet Take 1 tablet (150 mcg total) by mouth daily before breakfast. 30 tablet 1   losartan  (COZAAR ) 50 MG tablet Take 1 tablet (50 mg total) by mouth daily. 90 tablet 0   metFORMIN  (  GLUCOPHAGE -XR) 500 MG 24 hr tablet TAKE 2 TABLETS EVERY DAY 180 tablet 3   omeprazole  (PRILOSEC) 40 MG capsule Take 1 capsule (40 mg total) by mouth daily. 30 capsule 6   No current facility-administered medications on file prior to visit.   Allergies  Allergen Reactions   Doxycycline Other (See Comments)    Flu like symptoms (high fever, chills)   Nitrofurantoin Monohyd Macro Other (See Comments)    Flu like symptoms (high fever, chills)   Septra  [Sulfamethoxazole-Trimethoprim] Other (See Comments)    Flu like symptoms (high fever, chills)   Bupropion      Other Reaction(s): seizure   Clopidogrel      Other Reaction(s): hives   Lamictal  [Lamotrigine ] Rash   Family History  Problem Relation Age of Onset   Hypertension Mother    Heart disease Mother    Diabetes Father    Heart disease Father    Heart disease Brother    Breast cancer Maternal Grandmother    Uterine cancer Maternal Aunt    Seizures Neg Hx    Stomach cancer Neg Hx    Colon cancer Neg Hx    PE: BP 120/70   Pulse (!) 101   Ht 5\' 2"  (1.575 m)   Wt 153 lb 3.2 oz (69.5 kg)   LMP 04/25/2016   SpO2 97%   BMI 28.02 kg/m  Wt Readings from Last 3 Encounters:  06/17/23 153 lb 3.2 oz (69.5 kg)  05/23/23 152 lb 6.4 oz (69.1 kg)  02/27/23 153 lb 9.6 oz (69.7 kg)   Constitutional: normal weight, in NAD Eyes: EOMI, no exophthalmos ENT: no thyromegaly, no cervical lymphadenopathy Cardiovascular: Tachycardia, RR, No MRG Respiratory: CTA B Musculoskeletal: no deformities Skin: no rashes Neurological: no tremor with outstretched hands  ASSESSMENT: 1. DM2, insulin -dependent, with complications -acute CVA -03/09/2021  2. Hypothyroidism  -Post ablative, after RAI treatment for Graves' disease  3. HL  PLAN: 1. DM2 - Patient with history of uncontrolled type 2 diabetes, on oral antidiabetic regimen with metformin , SGLT2 inhibitor, and previously on GLP-1 receptor agonist (Ozempic ), but stopped due to the nausea before last visit.  At that time, HbA1c was higher, at 8.1%.  We discussed about stopping sweet drinks (Pepsi) but I also recommended to try to start Trulicity , which she tolerated well in the past but previously was not able to find it anymore due to national shortage.  I also recommended to start back on basal insulin  and titrate the dose up. - At today's visit she tells me that she had a lot of stress with her brother's death and did not get around to see if  she can get Trulicity  from the pharmacy.  She is planning to do so now, if I am going to call again.  I called it into [facility] per her request.  Since last visit, the sugars improved slightly in the morning, but she is not taking a higher dose of Lantus , 50 to 52 units  For now, I advised her to continue the same dose and to not increase it anymore.  Hopefully, she would be able to start Trulicity .  If not, we may need to work with sulfonylurea or rapid acting insulin  as an addition to metformin  and Jardiance .  However, we will need more blood sugars checked during the day (she now checks only in the morning) to be able to adjust her regimen.  I did recommend a CGM and she agrees to try the freestyle libre 3+.  Prescription sent to her pharmacy. - I suggested to:  Patient Instructions  STOP SWEET DRINKS!   Please continue: - Metformin  ER 1000 mg at night - Jardiance  25 mg before breakfast  - Lantus  50-52 units at bedtime  Please restart: - Trulicity  1.5 mg weekly   Please continue levothyroxine  150 mcg daily   Take the thyroid  hormone every day, with water , at least 30 minutes before breakfast, separated by at least 4 hours from: - acid reflux medications - calcium  - iron - multivitamins   Please stop at the lab.  Please return in 3-4 months with your sugar log.   - we checked her HbA1c: 8.2% (higher) - advised to check sugars at different times of the day - 1-2x a day, rotating check times - advised for yearly eye exams >> she is UTD - we will recheck her ACR today - return to clinic in 3-4 months      2. Hypothyroidism - Developed after RAI ablation - latest thyroid  labs reviewed with pt. >> normal: Lab Results  Component Value Date   TSH 0.97 08/07/2022  - she continues on LT4 150 mcg daily - pt feels good on this dose. - we discussed about taking the thyroid  hormone every day, with water , >30 minutes before breakfast, separated by >4 hours from acid  reflux medications, calcium , iron, multivitamins. Pt. is taking it correctly. - will check thyroid  tests today: TSH and fT4 - If labs are abnormal, she will need to return for repeat TFTs in 1.5 months  3. HL  - Latest lipid panel was reviewed from last month: LDL slightly above target of less than 55, HDL low, triglycerides elevated: Lab Results  Component Value Date   CHOL 124 05/23/2023   HDL 32 (L) 05/23/2023   LDLCALC 62 05/23/2023   TRIG 180 (H) 05/23/2023   CHOLHDL 3.9 05/23/2023  -She continues on atorvastatin  80 mg daily-no side effects  Needs LT4 refills to [facility].  Orders Placed This Encounter  Procedures   TSH   T4, free   Microalbumin / creatinine urine ratio   POCT glycosylated hemoglobin (Hb A1C)   Emilie Harden, MD PhD [facility] Endocr[facility]

## 2023-06-17 NOTE — Patient Instructions (Addendum)
 STOP SWEET DRINKS!   Please continue: - Metformin  ER 1000 mg at night - Jardiance  25 mg before breakfast  - Lantus  50-52 units at bedtime  Please restart: - Trulicity  1.5 mg weekly   Please continue levothyroxine  150 mcg daily   Take the thyroid  hormone every day, with water , at least 30 minutes before breakfast, separated by at least 4 hours from: - acid reflux medications - calcium  - iron - multivitamins   Please stop at the lab.  Please return in 3-4 months with your sugar log.

## 2023-06-18 ENCOUNTER — Encounter: Payer: Self-pay | Admitting: Internal Medicine

## 2023-06-18 MED ORDER — LEVOTHYROXINE SODIUM 150 MCG PO TABS: 137.0000 ug | ORAL_TABLET | Freq: Every day | ORAL | 3 refills | Status: AC

## 2023-06-18 NOTE — Addendum Note (Signed)
 Addended by: Emilie Harden on: 06/18/2023 12:53 PM   Modules accepted: Orders

## 2023-06-19 ENCOUNTER — Other Ambulatory Visit: Payer: Self-pay | Admitting: Nurse Practitioner

## 2023-06-19 DIAGNOSIS — D376 Neoplasm of uncertain behavior of liver, gallbladder and bile ducts: Secondary | ICD-10-CM

## 2023-06-19 DIAGNOSIS — R772 Abnormality of alphafetoprotein: Secondary | ICD-10-CM | POA: Diagnosis not present

## 2023-06-19 DIAGNOSIS — R799 Abnormal finding of blood chemistry, unspecified: Secondary | ICD-10-CM | POA: Diagnosis not present

## 2023-06-19 DIAGNOSIS — K7460 Unspecified cirrhosis of liver: Secondary | ICD-10-CM | POA: Diagnosis not present

## 2023-06-19 DIAGNOSIS — K7581 Nonalcoholic steatohepatitis (NASH): Secondary | ICD-10-CM | POA: Diagnosis not present

## 2023-06-19 LAB — MICROALBUMIN / CREATININE URINE RATIO
Creatinine, Urine: 39 mg/dL (ref 20–275)
Microalb Creat Ratio: 169 mg/g{creat} — ABNORMAL HIGH (ref <30)
Microalb, Ur: 6.6 mg/dL

## 2023-06-19 LAB — T4, FREE: Free T4: 1.7 ng/dL (ref 0.8–1.8)

## 2023-06-19 LAB — TSH: TSH: 2.3 m[IU]/L (ref 0.40–4.50)

## 2023-06-20 ENCOUNTER — Other Ambulatory Visit: Payer: Self-pay | Admitting: Internal Medicine

## 2023-06-23 ENCOUNTER — Other Ambulatory Visit: Payer: Self-pay

## 2023-06-23 DIAGNOSIS — E1165 Type 2 diabetes mellitus with hyperglycemia: Secondary | ICD-10-CM

## 2023-06-23 MED ORDER — EMPAGLIFLOZIN 25 MG PO TABS: 25.0000 mg | ORAL_TABLET | Freq: Every day | ORAL | 0 refills | Status: DC

## 2023-06-23 NOTE — Telephone Encounter (Signed)
 Requested Prescriptions   Signed Prescriptions Disp Refills   empagliflozin  (JARDIANCE ) 25 MG TABS tablet 90 tablet 0    Sig: Take 1 tablet (25 mg total) by mouth daily.    Authorizing Provider: Emilie Harden    Ordering User: Waneta Gut

## 2023-06-30 ENCOUNTER — Encounter: Payer: Self-pay | Admitting: Nurse Practitioner

## 2023-07-03 ENCOUNTER — Ambulatory Visit
Admission: RE | Admit: 2023-07-03 | Discharge: 2023-07-03 | Disposition: A | Discharge status: Home / Self Care | Source: Ambulatory Visit | Attending: Nurse Practitioner | Admitting: Nurse Practitioner

## 2023-07-03 DIAGNOSIS — K7460 Unspecified cirrhosis of liver: Secondary | ICD-10-CM

## 2023-07-03 DIAGNOSIS — D376 Neoplasm of uncertain behavior of liver, gallbladder and bile ducts: Secondary | ICD-10-CM

## 2023-07-07 DIAGNOSIS — R21 Rash and other nonspecific skin eruption: Secondary | ICD-10-CM | POA: Diagnosis not present

## 2023-07-08 DIAGNOSIS — R160 Hepatomegaly, not elsewhere classified: Secondary | ICD-10-CM | POA: Diagnosis not present

## 2023-07-08 DIAGNOSIS — K760 Fatty (change of) liver, not elsewhere classified: Secondary | ICD-10-CM | POA: Diagnosis not present

## 2023-07-08 DIAGNOSIS — K7460 Unspecified cirrhosis of liver: Secondary | ICD-10-CM | POA: Diagnosis not present

## 2023-07-08 MED ORDER — GADOPICLENOL 0.5 MMOL/ML IV SOLN
7.5000 mL | Freq: Once | INTRAVENOUS | Status: AC | PRN
  Administered 2023-07-08: 7.5 mL via INTRAVENOUS

## 2023-07-16 ENCOUNTER — Other Ambulatory Visit: Payer: Self-pay | Admitting: Cardiovascular Disease

## 2023-07-16 DIAGNOSIS — E782 Mixed hyperlipidemia: Secondary | ICD-10-CM

## 2023-07-16 DIAGNOSIS — R931 Abnormal findings on diagnostic imaging of heart and coronary circulation: Secondary | ICD-10-CM

## 2023-09-17 ENCOUNTER — Other Ambulatory Visit: Payer: Self-pay | Admitting: Internal Medicine

## 2023-09-17 DIAGNOSIS — E1165 Type 2 diabetes mellitus with hyperglycemia: Secondary | ICD-10-CM

## 2023-10-21 ENCOUNTER — Ambulatory Visit (INDEPENDENT_AMBULATORY_CARE_PROVIDER_SITE_OTHER): Admitting: Internal Medicine

## 2023-10-21 ENCOUNTER — Encounter: Payer: Self-pay | Admitting: Internal Medicine

## 2023-10-21 VITALS — BP 120/60 | HR 82 | Ht 62.0 in | Wt 162.0 lb

## 2023-10-21 DIAGNOSIS — E039 Hypothyroidism, unspecified: Secondary | ICD-10-CM

## 2023-10-21 DIAGNOSIS — Z794 Long term (current) use of insulin: Secondary | ICD-10-CM | POA: Diagnosis not present

## 2023-10-21 DIAGNOSIS — E1165 Type 2 diabetes mellitus with hyperglycemia: Secondary | ICD-10-CM | POA: Diagnosis not present

## 2023-10-21 DIAGNOSIS — E785 Hyperlipidemia, unspecified: Secondary | ICD-10-CM

## 2023-10-21 DIAGNOSIS — Z7984 Long term (current) use of oral hypoglycemic drugs: Secondary | ICD-10-CM | POA: Diagnosis not present

## 2023-10-21 LAB — POCT GLYCOSYLATED HEMOGLOBIN (HGB A1C): Hemoglobin A1C: 7.9 % — AB (ref 4.0–5.6)

## 2023-10-21 MED ORDER — TRULICITY 3 MG/0.5ML ~~LOC~~ SOAJ: 3.0000 mg | SUBCUTANEOUS | 3 refills | Status: AC

## 2023-10-21 NOTE — Progress Notes (Signed)
 Patient ID: Emma Glenn, female   DOB: [birth year], 58 y.o.   MRN: [id number]   HPI: Emma Glenn is a 58 y.o.-year-old female, initially referred by her GI Dr, Dr. Albertus, returning for follow-up for uncontrolled DM2, currently insulin  dependent, with complications (lacunar CVA 02/2021) and hypothyroidism. Last visit 4 months ago.  1st insurance: [employer] 2nd insurance: [employer]  Interim history: No increased urination, blurry vision, chest pain.  She reduced Pepsi before last visit but did not stop completely.  She continues to drink this.  DM2: Reviewed HbA1c levels: Lab Results  Component Value Date   HGBA1C 8.2 (A) 06/17/2023   HGBA1C 8.1 (A) 02/27/2023   HGBA1C 7.9 11/22/2022  11/22/2022: HbA1c 7.9% 07/29/2016: HbA1c 12.6%  Pt is on a regimen of: - Metformin  ER 1000 mg with breakfast >> at night - Jardiance  10 >> 25 mg before breakfast  - Trulicity  1.5 mg weekly - PAP >> off >> Ozempic  >> started 07/2022 but stopped 09/2022 2/2 nausea and decreased appetite >> started Ozempic  0.25 mcg >> gas, eructations, nausea >> Trulicity  1.5 mg weekly  - Lantus  43 >> 50-52 >> 52 units at night  She checks her sugars >4x a day:  Prev.: - am: 174-212 >> 174-200 >> 115, 174, 200, 246 >> 147-204 - 2h after b'fast:  125, 136 >> n/c >> 114 >> n/c  - before lunch: 91 >> 77-112 >> n/c >> 166, 167 >> n/c - 2h after lunch: 228 >> 89 >> n/c >> 118 >> n/c - before dinner: 83 >> 132 >> n/c >> 170, 172, 213 >> n/c - 2h after dinner: 131 >> 89 >> n/c >> 232 >> 176 >> n/c - bedtime:  <150 >> 123 >> n/c >> 98 >> n/c - nighttime: n/c Lowest sugar was 174 >> .SABRA. 147 >> 69 (sensor); it is unclear at which level she has hypoglycemia awareness: Highest sugar was 232 >> 246 >> 204 >> 300s.  [name] meals are: - Breakfast: Cereals or 2 eggs and toast - Lunch: Skips - Dinner: Meat and veggies - Snacks: Fruit, cookies Stopped milkshakes.  Meter: Bayer Contour  -No CKD, last BUN/creatinine:    Lab Results  Component Value Date   BUN 12 05/23/2023   BUN 11 03/06/2022   CREATININE 0.69 05/23/2023   CREATININE 0.76 03/06/2022   Lab Results  Component Value Date   MICRALBCREAT 169 (H) 06/17/2023    -+ HL; last set of lipids: Lab Results  Component Value Date   CHOL 124 05/23/2023   HDL 32 (L) 05/23/2023   LDLCALC 62 05/23/2023   TRIG 180 (H) 05/23/2023   CHOLHDL 3.9 05/23/2023   We restarted pravastatin  12/2016, after we consulted with her gastroenterologist, Dr. Albertus.  LFTs remained stable after starting the statin but started to increase afterwards, being higher at last check in 08/2017.  Dr. Albertus is aware of the above and considers these to be related to her fatty liver disease.  We continued her pravastatin  >> 80 mg Lipitor daily- started 02/2021. Of note, she sees Dr. Valdemar (hepatologist).  Latest LFTs:  Lab Results  Component Value Date   ALT 62 (H) 05/23/2023   AST 56 (H) 12/21/2021   ALKPHOS 70 12/21/2021   BILITOT 0.5 12/21/2021   ACR is fluctuating: Lab Results  Component Value Date   MICRALBCREAT 169 (H) 06/17/2023  07/29/2016: UACR was high, 148.9 On Cozaar  50 mg daily.  - last eye exam: 02/14/2023: No DR reportedly.   -  No numbness and tingling in her feet.  Last foot exam 11/22/2022.  She had an acute CVA on 03/09/2021:  small acute infarct in the posterior left basal ganglia >>  R arm and leg paresis >> resolved.  She stopped smoking after her stroke.  Hypothyroidism:  Pt is on levothyroxine  150 mcg daily (increased 02/2022), taken: - in am - fasting - at least 30 min from b'fast - no calcium  - no iron - no multivitamins - + PPIs at bedtime - not on Biotin  Reviewed her TFTs: Lab Results  Component Value Date   TSH 2.30 06/17/2023   TSH 0.97 08/07/2022   TSH 6.90 (H) 06/19/2022   TSH 7.47 (H) 02/18/2022   TSH 6.62 (H) 12/21/2021   TSH 9.18 (H) 10/17/2021   TSH 2.552 03/09/2021   TSH 1.78 04/19/2020   TSH 0.33 (L)  03/10/2019   TSH 0.10 (L) 11/04/2018   TSH 0.12 (L) 07/17/2018   TSH 4.77 (H) 04/02/2018   TSH 2.65 05/21/2017   TSH 17.64 (H) 02/18/2017   TSH 4.45 06/04/2016  07/29/2016: TSH 6.18.  Lab Results  Component Value Date   FREET4 1.7 06/17/2023   FREET4 1.53 08/07/2022   FREET4 1.26 06/19/2022   FREET4 1.08 02/18/2022   FREET4 1.34 12/21/2021   FREET4 1.20 10/17/2021   FREET4 1.41 04/19/2020   FREET4 1.21 03/10/2019   FREET4 1.68 (H) 11/04/2018   FREET4 1.60 07/17/2018   Reviewed previous history: She started to have severe generalized mm cramps in 03/2016.  Around that time >> RUQ pain >> HIDA scan >> GB w/o clear stones, but with sludge. She had an abd. CT scan: increased size of her liver + liver steatosis.  She was also found to have a 24-hour urine free cortisol, which returned slightly elevated: Component     Latest Ref Rng & Units 06/04/2016 06/14/2016  Cortisol (Ur), Free     4.0 - 50.0 mcg/24 h  57.0 (H)  Results received     0.63 - 2.50 g/24 h  1.27  Cortisol, Plasma     Ug/dL (collected at 5 pm) 6.8    A CT abdomen (05/2016) showed normal adrenals.  A brain MRI (04/2015) showed normal pituitary gland.  A dexamethasone  suppression test was normal >> no signs of Cushing's syndrome:  Component     Latest Ref Rng & Units 07/02/2016  Cortisol - AM     mcg/dL 1.8 (L)  Dexamethasone , Serum     ng/dL 614   She had a liver Bx  >> has level 3 scarring. 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  ROS: + See HPI  I reviewed pt's medications, allergies, PMH, social hx, family hx, and changes were documented in the history of present illness. Otherwise, unchanged from my initial visit note.  Past Medical History:  Diagnosis Date   Acute stress reaction    Allergic rhinitis    Anxiety    Autoimmune thyroiditis    CIN I (cervical intraepithelial neoplasia I)    Cirrhosis of liver (HCC)    COPD (chronic obstructive pulmonary disease)  (HCC)    Depression    DM (diabetes mellitus) (HCC)    GERD (gastroesophageal reflux disease)    Hemochromatosis    Hepatic steatosis    Hereditary hemochromatosis (HCC)    Hyperlipidemia    Hypertension    Hypothyroidism    Labial cyst    Inclusion cyst   Liver cirrhosis (HCC)  Morbid obesity (HCC)    NASH (nonalcoholic steatohepatitis)    Positive ANA (antinuclear antibody)    Pure hypercholesterolemia    Seizures (HCC)    last seizure 3/17? over dose wellbutrin    Thyroid  disease    Graves dis-Radioactive Iodine   Tobacco dependence    Past Surgical History:  Procedure Laterality Date   CERVICAL BIOPSY  W/ LOOP ELECTRODE EXCISION     Excision labial inclusion cyst   CESAREAN SECTION     COLPOSCOPY     HAND SURGERY     SHOULDER ARTHROSCOPY WITH OPEN ROTATOR CUFF REPAIR AND DISTAL CLAVICLE ACROMINECTOMY Left 06/13/2015   Procedure: LEFT SHOULDER ARTHROSCOPY, DEBRIDEMENT, OPEN ROTATOR CUFF REPAIR, CORACOID FRACTURE FIXATION, BICEPS TENODESIS;  Surgeon: Glendia Cordella Hutchinson, MD;  Location: [facility] OR;  Service: Orthopedics;  Laterality: Left;   SHOULDER CLOSED REDUCTION Left 09/26/2015   Procedure: CLOSED MANIPULATION SHOULDER UNDER ANESTHESIA;  Surgeon: Glendia Cordella Hutchinson, MD;  Location: [facility] OR;  Service: Orthopedics;  Laterality: Left;   TUBAL LIGATION     Social History   Social History   Marital status: Married    Spouse name: Emma Glenn   Number of children: 1   Years of education: 12   Occupational History   Administrator, [employer]    Social History Main Topics   Smoking status: Former Smoker    Packs/day: 1.00    Types: Cigarettes    Quit date: 02/14/2015   Smokeless tobacco: Never Used   Alcohol use 0.0 oz/week     Comment: rare   Drug use: No     Comment: Hx of marijuana use   Sexual activity: Yes    Birth control/ protection: Surgical   Social History Narrative   Lives with husband and sister   Caffeine use: Tea/coffee daily      Current Outpatient Medications on  File Prior to Visit  Medication Sig Dispense Refill   amLODipine  (NORVASC ) 10 MG tablet Take 1 tablet (10 mg total) by mouth daily. 90 tablet 3   aspirin  EC 81 MG EC tablet Take 1 tablet (81 mg total) by mouth daily with breakfast. take Aspirin  81 mg daily along with Plavix  75 mg daily for 30 days then after that STOP the Plavix   and continue ONLY Aspirin  81 mg daily indefinitely-- 30 tablet 11   atorvastatin  (LIPITOR) 80 MG tablet Take 1 tablet (80 mg total) by mouth daily. Take in place of Pravastatin  90 tablet 3   BEVESPI  AEROSPHERE 9-4.8 MCG/ACT AERO INHALE 2 PUFFS INTO THE LUNGS TWICE DAILY. REPLACES ANORO INHALER. 10.7 g 6   Continuous Glucose Sensor (FREESTYLE LIBRE 3 PLUS SENSOR) MISC 1 each by Does not apply route every 14 (fourteen) days. 6 each 3   Dulaglutide  (TRULICITY ) 1.5 MG/0.5ML SOAJ Inject 1.5 mg into the skin once a week. 6 mL 3   glucose blood (BAYER CONTOUR TEST) test strip Use 2x a day 200 each 3   insulin  glargine (LANTUS  SOLOSTAR) 100 UNIT/ML Solostar Pen Inject 50 Units into the skin at bedtime. 15 mL 11   Insulin  Pen Needle 32G X 4 MM MISC Use 1x a day 100 each 3   JARDIANCE  25 MG TABS tablet Take 1 tablet (25 mg total) by mouth daily. 90 tablet 0   levothyroxine  (SYNTHROID ) 150 MCG tablet Take 1 tablet (150 mcg total) by mouth daily before breakfast. 90 tablet 3   losartan  (COZAAR ) 50 MG tablet Take 1 tablet (50 mg total) by mouth daily. 90 tablet 3  metFORMIN  (GLUCOPHAGE -XR) 500 MG 24 hr tablet TAKE 2 TABLETS EVERY DAY 180 tablet 3   omeprazole  (PRILOSEC) 40 MG capsule Take 1 capsule (40 mg total) by mouth daily. 30 capsule 6   No current facility-administered medications on file prior to visit.   Allergies  Allergen Reactions   Doxycycline Other (See Comments)    Flu like symptoms (high fever, chills)   Nitrofurantoin Monohyd Macro Other (See Comments)    Flu like symptoms (high fever, chills)   Septra [Sulfamethoxazole-Trimethoprim] Other (See Comments)    Flu  like symptoms (high fever, chills)   Bupropion      Other Reaction(s): seizure   Clopidogrel      Other Reaction(s): hives   Lamictal  [Lamotrigine ] Rash   Family History  Problem Relation Age of Onset   Hypertension Mother    Heart disease Mother    Diabetes Father    Heart disease Father    Heart disease Brother    Breast cancer Maternal Grandmother    Uterine cancer Maternal Aunt    Seizures Neg Hx    Stomach cancer Neg Hx    Colon cancer Neg Hx    PE: BP 120/60   Pulse 82   Ht 5' 2 (1.575 m)   Wt 162 lb (73.5 kg)   LMP 04/25/2016   SpO2 98%   BMI 29.63 kg/m  Wt Readings from Last 3 Encounters:  10/21/23 162 lb (73.5 kg)  06/17/23 153 lb 3.2 oz (69.5 kg)  05/23/23 152 lb 6.4 oz (69.1 kg)   Constitutional: normal weight, in NAD Eyes: EOMI, no exophthalmos ENT: no thyromegaly, no cervical lymphadenopathy Cardiovascular: Tachycardia, RR, No MRG Respiratory: CTA B Musculoskeletal: no deformities Skin: no rashes Neurological: no tremor with outstretched hands Diabetic Foot Exam - Simple   Simple Foot Form Diabetic Foot exam was performed with the following findings: Yes 10/21/2023 10:12 AM  Visual Inspection No deformities, no ulcerations, no other skin breakdown bilaterally: Yes Sensation Testing Intact to touch and monofilament testing bilaterally: Yes Pulse Check Posterior Tibialis and Dorsalis pulse intact bilaterally: Yes Comments    ASSESSMENT: 1. DM2, insulin -dependent, with complications -acute CVA -03/09/2021  2. Hypothyroidism  -Post ablative, after RAI treatment for Graves' disease  3. HL  PLAN: 1. DM2 - Patient with history of uncontrolled type 2 diabetes on oral antidiabetic regimen with metformin , SGLT2 inhibitor, and previously on a GLP-1 receptor agonist (Ozempic ), but off this year due to nausea.  I did recommend Trulicity  previously but she was not on it at last visit as she did not check with the pharmacy if she could get it.  Sugars were  improved slightly in the morning but she was not checking later in the day and I recommended the freestyle libre 3+ CGM.  I sent a prescription for this to her pharmacy.  I also recommended to start Trulicity .  Also, I advised her to stop sweet drinks. HbA1c was higher, at 8.2%. CGM interpretation: -At today's visit, we reviewed her CGM downloads: It appears that 56% of values are in target range (goal >70%), while 45% are higher than 180 (goal <25%), and 0% are lower than 70 (goal <4%).  The calculated average blood sugar is 182.  The projected HbA1c for the next 3 months (GMI) is 7.7%. -Reviewing the CGM trends, sugars appear to be improving overnight, and they are more stable in the second half of the night.  They do increase after breakfast and improving slightly after lunch, but almost all of the  blood sugars are elevated after dinner.  Upon questioning, she mentions increased despite being on Trulicity .  We discussed about increasing the dose and trying to avoid sweet drinks.  We discussed about the fact that she should not eat sweets only at the end of the meal, or, if she needs to have a sweet snack between meals, to do light exercise afterwards to avoid a significant increase in blood sugars afterwards.  I did not suggest a change in the rest of her regimen. - I suggested to:  Patient Instructions  STOP SWEET DRINKS!   Please continue: - Metformin  ER 1000 mg at night - Jardiance  25 mg before breakfast  - Lantus  52 units at bedtime  Please increase: - Trulicity  3 mg weekly   Please continue levothyroxine  150 mcg daily   Take the thyroid  hormone every day, with water , at least 30 minutes before breakfast, separated by at least 4 hours from: - acid reflux medications - calcium  - iron - multivitamins   Please stop at the lab.  Please return in 3-4 months with your sugar log.   - we checked her HbA1c: 7.9% (lower) - advised to check sugars at different times of the day - 4x a day,  rotating check times - advised for yearly eye exams >> she is UTD - She again had an elevated ACR at last visit.  Will recheck this today.  She continues on losartan  50 mg daily and Jardiance  25 mg daily, which should also help. - return to clinic in 3-4 months      2. Hypothyroidism - Developed after RAI ablation - latest thyroid  labs reviewed with pt. >> normal: Lab Results  Component Value Date   TSH 2.30 06/17/2023  - she continues on LT4 150 mcg daily - pt feels good on this dose. - we discussed about taking the thyroid  hormone every day, with water , >30 minutes before breakfast, separated by >4 hours from acid reflux medications, calcium , iron, multivitamins. Pt. is taking it correctly.  3. HL  - Latest lipid panel was reviewed from 05/2023: LDL slightly above our goal, triglycerides also slightly high, HDL low: Lab Results  Component Value Date   CHOL 124 05/23/2023   HDL 32 (L) 05/23/2023   LDLCALC 62 05/23/2023   TRIG 180 (H) 05/23/2023   CHOLHDL 3.9 05/23/2023  - She continues atorvastatin  80 mg daily without side effects  Orders Placed This Encounter  Procedures   Microalbumin / creatinine urine ratio   Lela Fendt, MD PhD [facility]

## 2023-10-21 NOTE — Patient Instructions (Addendum)
 STOP SWEET DRINKS!   Please continue: - Metformin  ER 1000 mg at night - Jardiance  25 mg before breakfast  - Lantus  52 units at bedtime  Please increase: - Trulicity  3 mg weekly   Please continue levothyroxine  150 mcg daily   Take the thyroid  hormone every day, with water , at least 30 minutes before breakfast, separated by at least 4 hours from: - acid reflux medications - calcium  - iron - multivitamins   Please stop at the lab.  Please return in 3-4 months with your sugar log.

## 2023-10-22 ENCOUNTER — Ambulatory Visit: Payer: Self-pay | Admitting: Internal Medicine

## 2023-10-22 LAB — MICROALBUMIN / CREATININE URINE RATIO
Creatinine, Urine: 77 mg/dL (ref 20–275)
Microalb Creat Ratio: 168 mg/g{creat} — ABNORMAL HIGH (ref <30)
Microalb, Ur: 12.9 mg/dL

## 2023-12-15 ENCOUNTER — Encounter: Payer: Self-pay | Admitting: Radiology

## 2023-12-29 ENCOUNTER — Other Ambulatory Visit: Payer: Self-pay | Admitting: Nurse Practitioner

## 2023-12-29 DIAGNOSIS — D376 Neoplasm of uncertain behavior of liver, gallbladder and bile ducts: Secondary | ICD-10-CM | POA: Diagnosis not present

## 2023-12-29 DIAGNOSIS — K7469 Other cirrhosis of liver: Secondary | ICD-10-CM | POA: Diagnosis not present

## 2023-12-29 DIAGNOSIS — E1165 Type 2 diabetes mellitus with hyperglycemia: Secondary | ICD-10-CM | POA: Diagnosis not present

## 2023-12-29 DIAGNOSIS — K7581 Nonalcoholic steatohepatitis (NASH): Secondary | ICD-10-CM | POA: Diagnosis not present

## 2024-01-01 ENCOUNTER — Other Ambulatory Visit: Payer: Self-pay | Admitting: Internal Medicine

## 2024-01-01 DIAGNOSIS — E1165 Type 2 diabetes mellitus with hyperglycemia: Secondary | ICD-10-CM

## 2024-01-01 NOTE — Telephone Encounter (Signed)
 Refill request complete

## 2024-01-07 ENCOUNTER — Other Ambulatory Visit: Payer: Self-pay | Admitting: Internal Medicine

## 2024-01-12 ENCOUNTER — Ambulatory Visit
Admission: RE | Admit: 2024-01-12 | Discharge: 2024-01-12 | Disposition: A | Source: Ambulatory Visit | Attending: Nurse Practitioner

## 2024-01-12 DIAGNOSIS — K760 Fatty (change of) liver, not elsewhere classified: Secondary | ICD-10-CM | POA: Diagnosis not present

## 2024-01-12 DIAGNOSIS — K7469 Other cirrhosis of liver: Secondary | ICD-10-CM

## 2024-01-12 DIAGNOSIS — D376 Neoplasm of uncertain behavior of liver, gallbladder and bile ducts: Secondary | ICD-10-CM

## 2024-01-12 MED ORDER — GADOPICLENOL 0.5 MMOL/ML IV SOLN
7.5000 mL | Freq: Once | INTRAVENOUS | Status: AC | PRN
  Administered 2024-01-12: 7.5 mL via INTRAVENOUS

## 2024-02-06 ENCOUNTER — Other Ambulatory Visit: Payer: Self-pay | Admitting: Internal Medicine

## 2024-02-23 NOTE — Progress Notes (Unsigned)
 Patient ID: Emma Glenn, female   DOB: Mar 26, 1965, 59 y.o.   MRN: [id number]   HPI: Emma Glenn is a 59 y.o.-year-old female, initially referred by her GI Dr, Dr. Albertus, returning for follow-up for uncontrolled DM2, currently insulin  dependent, with complications (lacunar CVA 02/2021) and hypothyroidism. Last visit 4 months ago.  1st insurance: [employer] 2nd insurance: [employer]  Interim history: No increased urination, blurry vision, chest pain.  She reduced Pepsi before last visit but did not stop completely.  She continues to drink this.  DM2: Reviewed HbA1c levels: Lab Results  Component Value Date   HGBA1C 7.9 (A) 10/21/2023   HGBA1C 8.2 (A) 06/17/2023   HGBA1C 8.1 (A) 02/27/2023  11/22/2022: HbA1c 7.9% 07/29/2016: HbA1c 12.6%  Pt is on a regimen of: - Metformin  ER 1000 mg with breakfast >> at night - Jardiance  10 >> 25 mg before breakfast  - Trulicity  1.5 mg weekly - PAP >> off >> Ozempic  >> started 07/2022 but stopped 09/2022 2/2 nausea and decreased appetite >> started Ozempic  0.25 mcg >> gas, eructations, nausea >> Trulicity  1.5 mg weekly  - Lantus  43 >> 50-52 >> 52 units at night  She checks her sugars >4x a day:  Prev.: - am: 174-212 >> 174-200 >> 115, 174, 200, 246 >> 147-204 - 2h after b'fast:  125, 136 >> n/c >> 114 >> n/c  - before lunch: 91 >> 77-112 >> n/c >> 166, 167 >> n/c - 2h after lunch: 228 >> 89 >> n/c >> 118 >> n/c - before dinner: 83 >> 132 >> n/c >> 170, 172, 213 >> n/c - 2h after dinner: 131 >> 89 >> n/c >> 232 >> 176 >> n/c - bedtime:  <150 >> 123 >> n/c >> 98 >> n/c - nighttime: n/c Lowest sugar was 174 >> .SABRA. 147 >> 69 (sensor); it is unclear at which level she has hypoglycemia awareness: Highest sugar was 232 >> 246 >> 204 >> 300s.  [name] meals are: - Breakfast: Cereals or 2 eggs and toast - Lunch: Skips - Dinner: Meat and veggies - Snacks: Fruit, cookies Stopped milkshakes.  Meter: Bayer Contour  -No CKD, last BUN/creatinine:    Lab Results  Component Value Date   BUN 12 05/23/2023   BUN 11 03/06/2022   CREATININE 0.69 05/23/2023   CREATININE 0.76 03/06/2022   Lab Results  Component Value Date   MICRALBCREAT 168 (H) 10/21/2023   MICRALBCREAT 169 (H) 06/17/2023    -+ HL; last set of lipids: Lab Results  Component Value Date   CHOL 124 05/23/2023   HDL 32 (L) 05/23/2023   LDLCALC 62 05/23/2023   TRIG 180 (H) 05/23/2023   CHOLHDL 3.9 05/23/2023   We restarted pravastatin  12/2016, after we consulted with her gastroenterologist, Dr. Albertus.  LFTs remained stable after starting the statin but started to increase afterwards, being higher at last check in 08/2017.  Dr. Albertus is aware of the above and considers these to be related to her fatty liver disease.  We continued her pravastatin  >> 80 mg Lipitor daily- started 02/2021. Of note, she sees Dr. Valdemar (hepatologist).  Latest LFTs:  Lab Results  Component Value Date   ALT 62 (H) 05/23/2023   AST 56 (H) 12/21/2021   ALKPHOS 70 12/21/2021   BILITOT 0.5 12/21/2021   ACR is fluctuating: Lab Results  Component Value Date   MICRALBCREAT 168 (H) 10/21/2023   MICRALBCREAT 169 (H) 06/17/2023  07/29/2016: UACR was high, 148.9 On Cozaar  50  mg daily.  - last eye exam: 02/14/2023: No DR reportedly.   - No numbness and tingling in her feet.  Last foot exam 11/22/2022.  She had an acute CVA on 03/09/2021:  small acute infarct in the posterior left basal ganglia >>  R arm and leg paresis >> resolved.  She stopped smoking after her stroke.  Hypothyroidism:  Pt is on levothyroxine  150 mcg daily (increased 02/2022), taken: - in am - fasting - at least 30 min from b'fast - no calcium  - no iron - no multivitamins - + PPIs at bedtime - not on Biotin  Reviewed her TFTs: Lab Results  Component Value Date   TSH 2.30 06/17/2023   TSH 0.97 08/07/2022   TSH 6.90 (H) 06/19/2022   TSH 7.47 (H) 02/18/2022   TSH 6.62 (H) 12/21/2021   TSH 9.18 (H)  10/17/2021   TSH 2.552 03/09/2021   TSH 1.78 04/19/2020   TSH 0.33 (L) 03/10/2019   TSH 0.10 (L) 11/04/2018   TSH 0.12 (L) 07/17/2018   TSH 4.77 (H) 04/02/2018   TSH 2.65 05/21/2017   TSH 17.64 (H) 02/18/2017   TSH 4.45 06/04/2016  07/29/2016: TSH 6.18.  Lab Results  Component Value Date   FREET4 1.7 06/17/2023   FREET4 1.53 08/07/2022   FREET4 1.26 06/19/2022   FREET4 1.08 02/18/2022   FREET4 1.34 12/21/2021   FREET4 1.20 10/17/2021   FREET4 1.41 04/19/2020   FREET4 1.21 03/10/2019   FREET4 1.68 (H) 11/04/2018   FREET4 1.60 07/17/2018   Reviewed previous history: She started to have severe generalized mm cramps in 03/2016.  Around that time >> RUQ pain >> HIDA scan >> GB w/o clear stones, but with sludge. She had an abd. CT scan: increased size of her liver + liver steatosis.  She was also found to have a 24-hour urine free cortisol, which returned slightly elevated: Component     Latest Ref Rng & Units 06/04/2016 06/14/2016  Cortisol (Ur), Free     4.0 - 50.0 mcg/24 h  57.0 (H)  Results received     0.63 - 2.50 g/24 h  1.27  Cortisol, Plasma     Ug/dL (collected at 5 pm) 6.8    A CT abdomen (05/2016) showed normal adrenals.  A brain MRI (04/2015) showed normal pituitary gland.  A dexamethasone  suppression test was normal >> no signs of Cushing's syndrome:  Component     Latest Ref Rng & Units 07/02/2016  Cortisol - AM     mcg/dL 1.8 (L)  Dexamethasone , Serum     ng/dL 614   She had a liver Bx  >> has level 3 scarring. 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  ROS: + See HPI  I reviewed pt's medications, allergies, PMH, social hx, family hx, and changes were documented in the history of present illness. Otherwise, unchanged from my initial visit note.  Past Medical History:  Diagnosis Date   Acute stress reaction    Allergic rhinitis    Anxiety    Autoimmune thyroiditis    CIN I (cervical intraepithelial neoplasia I)     Cirrhosis of liver (HCC)    COPD (chronic obstructive pulmonary disease) (HCC)    Depression    DM (diabetes mellitus) (HCC)    GERD (gastroesophageal reflux disease)    Hemochromatosis    Hepatic steatosis    Hereditary hemochromatosis    Hyperlipidemia    Hypertension    Hypothyroidism  Labial cyst    Inclusion cyst   Liver cirrhosis (HCC)    Morbid obesity (HCC)    NASH (nonalcoholic steatohepatitis)    Positive ANA (antinuclear antibody)    Pure hypercholesterolemia    Seizures (HCC)    last seizure 3/17? over dose wellbutrin    Thyroid  disease    Graves dis-Radioactive Iodine   Tobacco dependence    Past Surgical History:  Procedure Laterality Date   CERVICAL BIOPSY  W/ LOOP ELECTRODE EXCISION     Excision labial inclusion cyst   CESAREAN SECTION     COLPOSCOPY     HAND SURGERY     SHOULDER ARTHROSCOPY WITH OPEN ROTATOR CUFF REPAIR AND DISTAL CLAVICLE ACROMINECTOMY Left 06/13/2015   Procedure: LEFT SHOULDER ARTHROSCOPY, DEBRIDEMENT, OPEN ROTATOR CUFF REPAIR, CORACOID FRACTURE FIXATION, BICEPS TENODESIS;  Surgeon: Glendia Cordella Hutchinson, MD;  Location: [facility] OR;  Service: Orthopedics;  Laterality: Left;   SHOULDER CLOSED REDUCTION Left 09/26/2015   Procedure: CLOSED MANIPULATION SHOULDER UNDER ANESTHESIA;  Surgeon: Glendia Cordella Hutchinson, MD;  Location: [facility] OR;  Service: Orthopedics;  Laterality: Left;   TUBAL LIGATION     Social History   Social History   Marital status: Married    Spouse name: Sheena   Number of children: 1   Years of education: 12   Occupational History   Administrator, civil service    Social History Main Topics   Smoking status: Former Smoker    Packs/day: 1.00    Types: Cigarettes    Quit date: 02/14/2015   Smokeless tobacco: Never Used   Alcohol use 0.0 oz/week     Comment: rare   Drug use: No     Comment: Hx of marijuana use   Sexual activity: Yes    Birth control/ protection: Surgical   Social History Narrative   Lives with husband and sister    Caffeine use: Tea/coffee daily      Current Outpatient Medications on File Prior to Visit  Medication Sig Dispense Refill   amLODipine  (NORVASC ) 10 MG tablet Take 1 tablet (10 mg total) by mouth daily. 90 tablet 3   aspirin  EC 81 MG EC tablet Take 1 tablet (81 mg total) by mouth daily with breakfast. take Aspirin  81 mg daily along with Plavix  75 mg daily for 30 days then after that STOP the Plavix   and continue ONLY Aspirin  81 mg daily indefinitely-- 30 tablet 11   atorvastatin  (LIPITOR) 80 MG tablet Take 1 tablet (80 mg total) by mouth daily. Take in place of Pravastatin  90 tablet 3   BEVESPI  AEROSPHERE 9-4.8 MCG/ACT AERO INHALE 2 PUFFS INTO THE LUNGS TWICE DAILY. REPLACES ANORO INHALER. 10.7 g 6   Continuous Glucose Sensor (FREESTYLE LIBRE 3 PLUS SENSOR) MISC 1 each by Does not apply route every 14 (fourteen) days. 6 each 3   Dulaglutide  (TRULICITY ) 3 MG/0.5ML SOAJ Inject 3 mg into the skin once a week. 6 mL 3   EMBECTA PEN NEEDLE NANO 2 GEN 32G X 4 MM MISC Use 1x a day 100 each 3   glucose blood (BAYER CONTOUR TEST) test strip Use 2x a day 200 each 3   insulin  glargine (LANTUS  SOLOSTAR) 100 UNIT/ML Solostar Pen Inject 50 Units into the skin at bedtime. 15 mL 11   JARDIANCE  25 MG TABS tablet TAKE ONE TABLET BY MOUTH EVERY DAY 90 tablet 0   levothyroxine  (SYNTHROID ) 150 MCG tablet Take 1 tablet (150 mcg total) by mouth daily before breakfast. 90 tablet 3   losartan  (COZAAR ) 50  MG tablet Take 1 tablet (50 mg total) by mouth daily. 90 tablet 3   metFORMIN  (GLUCOPHAGE -XR) 500 MG 24 hr tablet TAKE 2 TABLETS EVERY DAY 180 tablet 3   omeprazole  (PRILOSEC) 40 MG capsule Take 1 capsule (40 mg total) by mouth daily. 30 capsule 6   No current facility-administered medications on file prior to visit.   Allergies  Allergen Reactions   Doxycycline Other (See Comments)    Flu like symptoms (high fever, chills)   Nitrofurantoin Monohyd Macro Other (See Comments)    Flu like symptoms (high fever, chills)    Septra [Sulfamethoxazole-Trimethoprim] Other (See Comments)    Flu like symptoms (high fever, chills)   Bupropion      Other Reaction(s): seizure   Clopidogrel      Other Reaction(s): hives   Lamictal  [Lamotrigine ] Rash   Family History  Problem Relation Age of Onset   Hypertension Mother    Heart disease Mother    Diabetes Father    Heart disease Father    Heart disease Brother    Breast cancer Maternal Grandmother    Uterine cancer Maternal Aunt    Seizures Neg Hx    Stomach cancer Neg Hx    Colon cancer Neg Hx    PE: LMP 04/25/2016  Wt Readings from Last 3 Encounters:  10/21/23 162 lb (73.5 kg)  06/17/23 153 lb 3.2 oz (69.5 kg)  05/23/23 152 lb 6.4 oz (69.1 kg)   Constitutional: normal weight, in NAD Eyes: EOMI, no exophthalmos ENT: no thyromegaly, no cervical lymphadenopathy Cardiovascular: Tachycardia, RR, No MRG Respiratory: CTA B Musculoskeletal: no deformities Skin: no rashes Neurological: no tremor with outstretched hands Diabetic Foot Exam - Simple   No data filed    ASSESSMENT: 1. DM2, insulin -dependent, with complications -acute CVA -03/09/2021  2. Hypothyroidism  -Post ablative, after RAI treatment for Graves' disease  3. HL  PLAN: 1. DM2 - Patient with history of uncontrolled type 2 diabetes on oral antidiabetic regimen with metformin , SGLT2 inhibitor, and previously on a GLP-1 receptor agonist (Ozempic ), but off this year due to nausea.  I did recommend Trulicity  previously but she was not on it at last visit as she did not check with the pharmacy if she could get it.  Sugars were improved slightly in the morning but she was not checking later in the day and I recommended the freestyle libre 3+ CGM.  I sent a prescription for this to her pharmacy.  I also recommended to start Trulicity .  Also, I advised her to stop sweet drinks. HbA1c was higher, at 8.2%. CGM interpretation: -At today's visit, we reviewed her CGM downloads: It appears that 56% of  values are in target range (goal >70%), while 45% are higher than 180 (goal <25%), and 0% are lower than 70 (goal <4%).  The calculated average blood sugar is 182.  The projected HbA1c for the next 3 months (GMI) is 7.7%. -Reviewing the CGM trends, sugars appear to be improving overnight, and they are more stable in the second half of the night.  They do increase after breakfast and improving slightly after lunch, but almost all of the blood sugars are elevated after dinner.  Upon questioning, she mentions increased despite being on Trulicity .  We discussed about increasing the dose and trying to avoid sweet drinks.  We discussed about the fact that she should not eat sweets only at the end of the meal, or, if she needs to have a sweet snack between meals, to do light  exercise afterwards to avoid a significant increase in blood sugars afterwards.  I did not suggest a change in the rest of her regimen. - I suggested to:  Patient Instructions  STOP SWEET DRINKS!   Please continue: - Metformin  ER 1000 mg at night - Jardiance  25 mg before breakfast  - Lantus  52 units at bedtime  Please increase: - Trulicity  3 mg weekly   Please continue levothyroxine  150 mcg daily   Take the thyroid  hormone every day, with water , at least 30 minutes before breakfast, separated by at least 4 hours from: - acid reflux medications - calcium  - iron - multivitamins   Please stop at the lab.  Please return in 3-4 months with your sugar log.   - we checked her HbA1c: 7.9% (lower) - advised to check sugars at different times of the day - 4x a day, rotating check times - advised for yearly eye exams >> she is UTD - She again had an elevated ACR at last visit.  Will recheck this today.  She continues on losartan  50 mg daily and Jardiance  25 mg daily, which should also help. - return to clinic in 3-4 months      2. Hypothyroidism - Developed after RAI ablation - latest thyroid  labs reviewed with pt. >>  normal: Lab Results  Component Value Date   TSH 2.30 06/17/2023  - she continues on LT4 150 mcg daily - pt feels good on this dose. - we discussed about taking the thyroid  hormone every day, with water , >30 minutes before breakfast, separated by >4 hours from acid reflux medications, calcium , iron, multivitamins. Pt. is taking it correctly.  3. HL  - Latest lipid panel was reviewed from 05/2023: LDL slightly above our goal, triglycerides also slightly high, HDL low: Lab Results  Component Value Date   CHOL 124 05/23/2023   HDL 32 (L) 05/23/2023   LDLCALC 62 05/23/2023   TRIG 180 (H) 05/23/2023   CHOLHDL 3.9 05/23/2023  - She continues atorvastatin  80 mg daily without side effects  Component     Latest Ref Rng 10/21/2023  Hemoglobin A1C     4.0 - 5.6 % 7.9 !   Creatinine, Urine     20 - 275 mg/dL 77   Microalb, Ur     mg/dL 87.0   MICROALB/CREAT RATIO     <30 mg/g creat 168 (H)    ACR is still elevated, but stable.  I am hoping that this will start improving after diabetes becomes more controlled.  Will continue losartan  and Jardiance  for now.  Since the ACR is lower than 300s and GFR is higher than 45, no immediate referral to nephrology is needed.  Lela Fendt, MD PhD [facility] Endocrinology

## 2024-02-24 ENCOUNTER — Ambulatory Visit: Admitting: Internal Medicine

## 2024-02-24 ENCOUNTER — Encounter: Payer: Self-pay | Admitting: Internal Medicine

## 2024-02-24 VITALS — BP 120/70 | HR 98 | Ht 62.0 in | Wt 148.4 lb

## 2024-02-24 DIAGNOSIS — E785 Hyperlipidemia, unspecified: Secondary | ICD-10-CM | POA: Diagnosis not present

## 2024-02-24 DIAGNOSIS — Z7984 Long term (current) use of oral hypoglycemic drugs: Secondary | ICD-10-CM | POA: Diagnosis not present

## 2024-02-24 DIAGNOSIS — Z794 Long term (current) use of insulin: Secondary | ICD-10-CM

## 2024-02-24 DIAGNOSIS — E1165 Type 2 diabetes mellitus with hyperglycemia: Secondary | ICD-10-CM

## 2024-02-24 DIAGNOSIS — E039 Hypothyroidism, unspecified: Secondary | ICD-10-CM | POA: Diagnosis not present

## 2024-02-24 DIAGNOSIS — Z7985 Long-term (current) use of injectable non-insulin antidiabetic drugs: Secondary | ICD-10-CM | POA: Diagnosis not present

## 2024-02-24 MED ORDER — LANTUS SOLOSTAR 100 UNIT/ML ~~LOC~~ SOPN: 26.0000 [IU] | PEN_INJECTOR | Freq: Every day | SUBCUTANEOUS | 3 refills | Status: AC

## 2024-02-24 NOTE — Patient Instructions (Addendum)
 Please continue: - Metformin  ER 1000 mg at night - Jardiance  25 mg before breakfast  - Trulicity  3 mg weekly  Decrease: - Lantus  to 26 units daily in am  Try Mylanta whenever you have nausea.   STOP SWEET DRINKS!  Please continue levothyroxine  150 mcg daily   Take the thyroid  hormone every day, with water , at least 30 minutes before breakfast, separated by at least 4 hours from: - acid reflux medications - calcium  - iron - multivitamins  Please return in 3-4 months.

## 2024-06-17 ENCOUNTER — Ambulatory Visit: Admitting: Internal Medicine
